# Patient Record
Sex: Male | Born: 1955
Health system: Southern US, Community
[De-identification: ages and names within clinical notes are randomized; demographics above are authoritative.]

## PROBLEM LIST (undated history)

## (undated) ENCOUNTER — Ambulatory Visit: Admission: EM | Payer: PPO

## (undated) DIAGNOSIS — Z8719 Personal history of other diseases of the digestive system: Secondary | ICD-10-CM

## (undated) DIAGNOSIS — Z7901 Long term (current) use of anticoagulants: Secondary | ICD-10-CM

## (undated) DIAGNOSIS — G8929 Other chronic pain: Secondary | ICD-10-CM

## (undated) DIAGNOSIS — Z79899 Other long term (current) drug therapy: Secondary | ICD-10-CM

## (undated) DIAGNOSIS — Z955 Presence of coronary angioplasty implant and graft: Secondary | ICD-10-CM

## (undated) DIAGNOSIS — I447 Left bundle-branch block, unspecified: Secondary | ICD-10-CM

## (undated) DIAGNOSIS — E785 Hyperlipidemia, unspecified: Secondary | ICD-10-CM

## (undated) DIAGNOSIS — E041 Nontoxic single thyroid nodule: Secondary | ICD-10-CM

## (undated) DIAGNOSIS — I10 Essential (primary) hypertension: Secondary | ICD-10-CM

## (undated) DIAGNOSIS — R519 Headache, unspecified: Secondary | ICD-10-CM

## (undated) DIAGNOSIS — Z87442 Personal history of urinary calculi: Secondary | ICD-10-CM

## (undated) DIAGNOSIS — Z9189 Other specified personal risk factors, not elsewhere classified: Secondary | ICD-10-CM

## (undated) DIAGNOSIS — I5020 Unspecified systolic (congestive) heart failure: Secondary | ICD-10-CM

## (undated) DIAGNOSIS — I700 Atherosclerosis of aorta: Secondary | ICD-10-CM

## (undated) DIAGNOSIS — D494 Neoplasm of unspecified behavior of bladder: Secondary | ICD-10-CM

## (undated) DIAGNOSIS — I7143 Infrarenal abdominal aortic aneurysm, without rupture: Secondary | ICD-10-CM

## (undated) DIAGNOSIS — K219 Gastro-esophageal reflux disease without esophagitis: Secondary | ICD-10-CM

## (undated) DIAGNOSIS — C679 Malignant neoplasm of bladder, unspecified: Secondary | ICD-10-CM

## (undated) DIAGNOSIS — Z860101 Personal history of adenomatous and serrated colon polyps: Secondary | ICD-10-CM

## (undated) DIAGNOSIS — I493 Ventricular premature depolarization: Secondary | ICD-10-CM

## (undated) DIAGNOSIS — N289 Disorder of kidney and ureter, unspecified: Secondary | ICD-10-CM

## (undated) DIAGNOSIS — I2510 Atherosclerotic heart disease of native coronary artery without angina pectoris: Secondary | ICD-10-CM

## (undated) DIAGNOSIS — Z7902 Long term (current) use of antithrombotics/antiplatelets: Secondary | ICD-10-CM

## (undated) DIAGNOSIS — R51 Headache: Secondary | ICD-10-CM

## (undated) DIAGNOSIS — F419 Anxiety disorder, unspecified: Secondary | ICD-10-CM

## (undated) DIAGNOSIS — Z7982 Long term (current) use of aspirin: Secondary | ICD-10-CM

## (undated) DIAGNOSIS — N503 Cyst of epididymis: Secondary | ICD-10-CM

## (undated) DIAGNOSIS — M1990 Unspecified osteoarthritis, unspecified site: Secondary | ICD-10-CM

## (undated) DIAGNOSIS — N400 Enlarged prostate without lower urinary tract symptoms: Secondary | ICD-10-CM

## (undated) DIAGNOSIS — J449 Chronic obstructive pulmonary disease, unspecified: Secondary | ICD-10-CM

## (undated) DIAGNOSIS — I219 Acute myocardial infarction, unspecified: Secondary | ICD-10-CM

## (undated) DIAGNOSIS — C4490 Unspecified malignant neoplasm of skin, unspecified: Secondary | ICD-10-CM

## (undated) DIAGNOSIS — E559 Vitamin D deficiency, unspecified: Secondary | ICD-10-CM

## (undated) DIAGNOSIS — G709 Myoneural disorder, unspecified: Secondary | ICD-10-CM

## (undated) DIAGNOSIS — Z86010 Personal history of colonic polyps: Secondary | ICD-10-CM

## (undated) DIAGNOSIS — I252 Old myocardial infarction: Secondary | ICD-10-CM

## (undated) HISTORY — PX: CARDIOVASCULAR STRESS TEST: SHX262

## (undated) HISTORY — PX: TRANSTHORACIC ECHOCARDIOGRAM: SHX275

---
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
## 2003-04-06 ENCOUNTER — Other Ambulatory Visit: Payer: Self-pay

## 2003-04-06 DIAGNOSIS — I2119 ST elevation (STEMI) myocardial infarction involving other coronary artery of inferior wall: Secondary | ICD-10-CM

## 2003-04-06 HISTORY — DX: ST elevation (STEMI) myocardial infarction involving other coronary artery of inferior wall: I21.19

## 2003-04-07 ENCOUNTER — Other Ambulatory Visit: Payer: Self-pay

## 2003-04-10 ENCOUNTER — Other Ambulatory Visit: Payer: Self-pay

## 2003-04-10 DIAGNOSIS — I2510 Atherosclerotic heart disease of native coronary artery without angina pectoris: Secondary | ICD-10-CM

## 2003-04-10 HISTORY — PX: CORONARY ANGIOPLASTY WITH STENT PLACEMENT: SHX49

## 2003-04-10 HISTORY — DX: Atherosclerotic heart disease of native coronary artery without angina pectoris: I25.10

## 2003-08-19 ENCOUNTER — Other Ambulatory Visit: Payer: Self-pay

## 2003-08-21 ENCOUNTER — Other Ambulatory Visit: Payer: Self-pay

## 2004-03-11 ENCOUNTER — Ambulatory Visit: Payer: Self-pay | Admitting: Specialist

## 2004-04-07 HISTORY — PX: PARTIAL COLECTOMY: SHX5273

## 2004-04-16 ENCOUNTER — Ambulatory Visit: Payer: Self-pay | Admitting: Gastroenterology

## 2004-05-16 ENCOUNTER — Ambulatory Visit: Payer: Self-pay | Admitting: Internal Medicine

## 2004-05-24 ENCOUNTER — Other Ambulatory Visit: Payer: Self-pay

## 2004-05-29 ENCOUNTER — Inpatient Hospital Stay: Payer: Self-pay | Admitting: General Surgery

## 2004-05-31 ENCOUNTER — Other Ambulatory Visit: Payer: Self-pay

## 2004-06-01 ENCOUNTER — Other Ambulatory Visit: Payer: Self-pay

## 2004-06-05 ENCOUNTER — Ambulatory Visit: Payer: Self-pay | Admitting: Internal Medicine

## 2005-05-06 ENCOUNTER — Encounter: Payer: Self-pay | Admitting: Internal Medicine

## 2005-05-08 ENCOUNTER — Encounter: Payer: Self-pay | Admitting: Internal Medicine

## 2005-06-05 ENCOUNTER — Encounter: Payer: Self-pay | Admitting: Internal Medicine

## 2005-07-15 ENCOUNTER — Ambulatory Visit: Payer: Self-pay | Admitting: Physician Assistant

## 2005-08-25 ENCOUNTER — Ambulatory Visit: Payer: Self-pay | Admitting: Unknown Physician Specialty

## 2006-02-12 ENCOUNTER — Ambulatory Visit: Payer: Self-pay | Admitting: Vascular Surgery

## 2006-03-05 ENCOUNTER — Ambulatory Visit: Payer: Self-pay | Admitting: Gastroenterology

## 2006-07-16 ENCOUNTER — Ambulatory Visit: Payer: Self-pay | Admitting: Internal Medicine

## 2008-08-14 ENCOUNTER — Ambulatory Visit: Payer: Self-pay | Admitting: Gastroenterology

## 2010-04-15 ENCOUNTER — Ambulatory Visit: Payer: Self-pay | Admitting: Internal Medicine

## 2010-10-28 ENCOUNTER — Ambulatory Visit: Payer: Self-pay | Admitting: Gastroenterology

## 2010-10-29 LAB — PATHOLOGY REPORT

## 2011-01-30 ENCOUNTER — Ambulatory Visit: Payer: Self-pay

## 2012-04-07 HISTORY — PX: TRANSURETHRAL RESECTION OF BLADDER TUMOR: SHX2575

## 2012-07-12 ENCOUNTER — Ambulatory Visit: Payer: Self-pay | Admitting: Urology

## 2012-07-12 DIAGNOSIS — I2510 Atherosclerotic heart disease of native coronary artery without angina pectoris: Secondary | ICD-10-CM

## 2012-07-12 LAB — CBC WITH DIFFERENTIAL/PLATELET
Basophil #: 0 10*3/uL (ref 0.0–0.1)
Basophil %: 0.4 %
Eosinophil #: 0.4 10*3/uL (ref 0.0–0.7)
Eosinophil %: 3.9 %
HGB: 16.9 g/dL (ref 13.0–18.0)
Lymphocyte #: 2.3 10*3/uL (ref 1.0–3.6)
Lymphocyte %: 24.9 %
MCH: 30.5 pg (ref 26.0–34.0)
MCV: 90 fL (ref 80–100)
Monocyte #: 0.5 x10 3/mm (ref 0.2–1.0)
Monocyte %: 5.8 %
Neutrophil %: 65 %
Platelet: 193 10*3/uL (ref 150–440)
RBC: 5.56 10*6/uL (ref 4.40–5.90)
RDW: 12.8 % (ref 11.5–14.5)
WBC: 9.3 10*3/uL (ref 3.8–10.6)

## 2012-07-12 LAB — BASIC METABOLIC PANEL
Anion Gap: 2 — ABNORMAL LOW (ref 7–16)
BUN: 13 mg/dL (ref 7–18)
Calcium, Total: 9.1 mg/dL (ref 8.5–10.1)
Co2: 30 mmol/L (ref 21–32)
Creatinine: 0.85 mg/dL (ref 0.60–1.30)
Glucose: 88 mg/dL (ref 65–99)
Potassium: 4.6 mmol/L (ref 3.5–5.1)
Sodium: 139 mmol/L (ref 136–145)

## 2012-09-07 ENCOUNTER — Ambulatory Visit: Payer: Self-pay | Admitting: Urology

## 2012-09-13 ENCOUNTER — Ambulatory Visit: Payer: Self-pay | Admitting: Urology

## 2012-09-14 DIAGNOSIS — C679 Malignant neoplasm of bladder, unspecified: Secondary | ICD-10-CM

## 2012-09-14 HISTORY — DX: Malignant neoplasm of bladder, unspecified: C67.9

## 2012-09-16 LAB — PATHOLOGY REPORT

## 2013-03-07 ENCOUNTER — Observation Stay: Payer: Self-pay | Admitting: Internal Medicine

## 2013-03-07 LAB — CK TOTAL AND CKMB (NOT AT ARMC)
CK, Total: 95 U/L (ref 35–232)
CK-MB: 1.1 ng/mL (ref 0.5–3.6)
CK-MB: 1.2 ng/mL (ref 0.5–3.6)

## 2013-03-07 LAB — URINALYSIS, COMPLETE
Bacteria: NONE SEEN
Blood: NEGATIVE
Nitrite: NEGATIVE
Ph: 5 (ref 4.5–8.0)
Protein: NEGATIVE
RBC,UR: 1 /HPF (ref 0–5)
WBC UR: 1 /HPF (ref 0–5)

## 2013-03-07 LAB — CBC
HCT: 47.7 % (ref 40.0–52.0)
Platelet: 184 10*3/uL (ref 150–440)
RBC: 5.39 10*6/uL (ref 4.40–5.90)
RDW: 13.3 % (ref 11.5–14.5)

## 2013-03-07 LAB — BASIC METABOLIC PANEL
Anion Gap: 3 — ABNORMAL LOW (ref 7–16)
BUN: 17 mg/dL (ref 7–18)
Chloride: 108 mmol/L — ABNORMAL HIGH (ref 98–107)
Creatinine: 0.93 mg/dL (ref 0.60–1.30)
EGFR (African American): >60
EGFR (Non-African Amer.): >60
Glucose: 92 mg/dL (ref 65–99)
Osmolality: 281 (ref 275–301)

## 2013-03-07 LAB — APTT: Activated PTT: 28.6 secs (ref 23.6–35.9)

## 2013-03-07 LAB — TROPONIN I
Troponin-I: <0.02 ng/mL
Troponin-I: <0.02 ng/mL

## 2013-03-08 LAB — APTT: Activated PTT: 92 secs — ABNORMAL HIGH (ref 23.6–35.9)

## 2013-03-08 LAB — CK TOTAL AND CKMB (NOT AT ARMC)
CK, Total: 83 U/L (ref 35–232)
CK-MB: 0.9 ng/mL (ref 0.5–3.6)

## 2013-03-08 LAB — TROPONIN I: Troponin-I: <0.02 ng/mL

## 2013-09-15 ENCOUNTER — Other Ambulatory Visit: Payer: Self-pay | Admitting: Urology

## 2013-09-16 ENCOUNTER — Encounter (HOSPITAL_BASED_OUTPATIENT_CLINIC_OR_DEPARTMENT_OTHER): Payer: Self-pay | Admitting: *Deleted

## 2013-09-16 NOTE — Progress Notes (Signed)
09/16/13 1552  OBSTRUCTIVE SLEEP APNEA  Have you ever been diagnosed with sleep apnea through a sleep study? No  Do you snore loudly (loud enough to be heard through closed doors)?  1  Do you often feel tired, fatigued, or sleepy during the daytime? 0  Has anyone observed you stop breathing during your sleep? 0  Do you have, or are you being treated for high blood pressure? 1  BMI more than 35 kg/m2? 0  Age over 58 years old? 1  Neck circumference greater than 40 cm/16 inches? 1  Gender: 1  Obstructive Sleep Apnea Score 5  Score 4 or greater  Results sent to PCP

## 2013-09-16 NOTE — Progress Notes (Addendum)
NPO AFTER MN WITH EXCEPTION CLEAR LIQUIDS UNTIL 0730 (NO CREAM/ MILK PRODUCTS).  ARRIVE AT 1200. NEEDS ISTAT. CURRENT EKG, LOV NOTE, STRESS TEST TO BE FAXED FROM DR Nehemiah Massed  #[phone] [facility] ).  WILL TAKE PROTONIX AM DOS W/ SIPS OF WATER.  Received fax--  Pt needs ekg on arrival.

## 2013-09-26 ENCOUNTER — Ambulatory Visit (HOSPITAL_COMMUNITY): Payer: BC Managed Care – PPO

## 2013-09-26 ENCOUNTER — Encounter (HOSPITAL_BASED_OUTPATIENT_CLINIC_OR_DEPARTMENT_OTHER)
Admission: RE | Disposition: A | Discharge status: Home / Self Care | Payer: Self-pay | Source: Ambulatory Visit | Attending: Urology

## 2013-09-26 ENCOUNTER — Ambulatory Visit (HOSPITAL_BASED_OUTPATIENT_CLINIC_OR_DEPARTMENT_OTHER)
Admission: RE | Admit: 2013-09-26 | Discharge: 2013-09-26 | Disposition: A | Discharge status: Home / Self Care | Payer: BC Managed Care – PPO | Source: Ambulatory Visit | Attending: Urology | Admitting: Urology

## 2013-09-26 ENCOUNTER — Encounter (HOSPITAL_BASED_OUTPATIENT_CLINIC_OR_DEPARTMENT_OTHER): Payer: BC Managed Care – PPO | Admitting: Anesthesiology

## 2013-09-26 ENCOUNTER — Encounter (HOSPITAL_BASED_OUTPATIENT_CLINIC_OR_DEPARTMENT_OTHER): Payer: Self-pay | Admitting: *Deleted

## 2013-09-26 ENCOUNTER — Ambulatory Visit (HOSPITAL_BASED_OUTPATIENT_CLINIC_OR_DEPARTMENT_OTHER): Payer: BC Managed Care – PPO | Admitting: Anesthesiology

## 2013-09-26 DIAGNOSIS — I2510 Atherosclerotic heart disease of native coronary artery without angina pectoris: Secondary | ICD-10-CM | POA: Insufficient documentation

## 2013-09-26 DIAGNOSIS — I252 Old myocardial infarction: Secondary | ICD-10-CM | POA: Insufficient documentation

## 2013-09-26 DIAGNOSIS — Z87891 Personal history of nicotine dependence: Secondary | ICD-10-CM | POA: Insufficient documentation

## 2013-09-26 DIAGNOSIS — K219 Gastro-esophageal reflux disease without esophagitis: Secondary | ICD-10-CM | POA: Insufficient documentation

## 2013-09-26 DIAGNOSIS — K449 Diaphragmatic hernia without obstruction or gangrene: Secondary | ICD-10-CM | POA: Insufficient documentation

## 2013-09-26 DIAGNOSIS — C679 Malignant neoplasm of bladder, unspecified: Secondary | ICD-10-CM | POA: Insufficient documentation

## 2013-09-26 DIAGNOSIS — Z9861 Coronary angioplasty status: Secondary | ICD-10-CM | POA: Insufficient documentation

## 2013-09-26 DIAGNOSIS — C674 Malignant neoplasm of posterior wall of bladder: Secondary | ICD-10-CM

## 2013-09-26 DIAGNOSIS — Z8551 Personal history of malignant neoplasm of bladder: Secondary | ICD-10-CM | POA: Insufficient documentation

## 2013-09-26 HISTORY — DX: Personal history of adenomatous and serrated colon polyps: Z86.0101

## 2013-09-26 HISTORY — PX: CYSTOSCOPY W/ RETROGRADES: SHX1426

## 2013-09-26 HISTORY — PX: TRANSURETHRAL RESECTION OF BLADDER TUMOR WITH GYRUS (TURBT-GYRUS): SHX6458

## 2013-09-26 HISTORY — DX: Presence of coronary angioplasty implant and graft: Z95.5

## 2013-09-26 HISTORY — DX: Other specified personal risk factors, not elsewhere classified: Z91.89

## 2013-09-26 HISTORY — DX: Gastro-esophageal reflux disease without esophagitis: K21.9

## 2013-09-26 HISTORY — DX: Atherosclerotic heart disease of native coronary artery without angina pectoris: I25.10

## 2013-09-26 HISTORY — DX: Old myocardial infarction: I25.2

## 2013-09-26 HISTORY — DX: Personal history of colonic polyps: Z86.010

## 2013-09-26 HISTORY — DX: Hyperlipidemia, unspecified: E78.5

## 2013-09-26 HISTORY — DX: Malignant neoplasm of bladder, unspecified: C67.9

## 2013-09-26 HISTORY — DX: Personal history of other diseases of the digestive system: Z87.19

## 2013-09-26 LAB — POCT I-STAT 4, (NA,K, GLUC, HGB,HCT)
Glucose, Bld: 88 mg/dL (ref 70–99)
HEMATOCRIT: 50 % (ref 39.0–52.0)
Hemoglobin: 17 g/dL (ref 13.0–17.0)
Potassium: 4.2 mEq/L (ref 3.7–5.3)
Sodium: 142 mEq/L (ref 137–147)

## 2013-09-26 SURGERY — TRANSURETHRAL RESECTION OF BLADDER TUMOR WITH GYRUS (TURBT-GYRUS)
Anesthesia: General | Site: Ureter

## 2013-09-26 MED ORDER — BELLADONNA ALKALOIDS-OPIUM 16.2-60 MG RE SUPP
RECTAL | Status: AC
  Filled 2013-09-26: qty 1

## 2013-09-26 MED ORDER — LACTATED RINGERS IV SOLN
INTRAVENOUS | Status: DC
  Administered 2013-09-26 (×2): via INTRAVENOUS
  Filled 2013-09-26: qty 1000

## 2013-09-26 MED ORDER — PROPOFOL 10 MG/ML IV BOLUS
INTRAVENOUS | Status: DC | PRN
  Administered 2013-09-26: 200 mg via INTRAVENOUS

## 2013-09-26 MED ORDER — PHENAZOPYRIDINE HCL 200 MG PO TABS
200.0000 mg | ORAL_TABLET | Freq: Once | ORAL | Status: AC
  Administered 2013-09-26: 200 mg via ORAL
  Filled 2013-09-26: qty 1

## 2013-09-26 MED ORDER — DEXAMETHASONE SODIUM PHOSPHATE 4 MG/ML IJ SOLN
INTRAMUSCULAR | Status: DC | PRN
  Administered 2013-09-26: 10 mg via INTRAVENOUS

## 2013-09-26 MED ORDER — SENNOSIDES-DOCUSATE SODIUM 8.6-50 MG PO TABS: 1.0000 | ORAL_TABLET | Freq: Two times a day (BID) | ORAL | Status: DC

## 2013-09-26 MED ORDER — LACTATED RINGERS IV SOLN
INTRAVENOUS | Status: DC
  Filled 2013-09-26: qty 1000

## 2013-09-26 MED ORDER — MIDAZOLAM HCL 5 MG/5ML IJ SOLN
INTRAMUSCULAR | Status: DC | PRN
  Administered 2013-09-26: 2 mg via INTRAVENOUS

## 2013-09-26 MED ORDER — FENTANYL CITRATE 0.05 MG/ML IJ SOLN
INTRAMUSCULAR | Status: AC
  Filled 2013-09-26: qty 2

## 2013-09-26 MED ORDER — FENTANYL CITRATE 0.05 MG/ML IJ SOLN
INTRAMUSCULAR | Status: DC | PRN
  Administered 2013-09-26: 50 ug via INTRAVENOUS
  Administered 2013-09-26 (×3): 25 ug via INTRAVENOUS
  Administered 2013-09-26: 50 ug via INTRAVENOUS
  Administered 2013-09-26: 25 ug via INTRAVENOUS
  Administered 2013-09-26: 50 ug via INTRAVENOUS

## 2013-09-26 MED ORDER — MIDAZOLAM HCL 2 MG/2ML IJ SOLN
INTRAMUSCULAR | Status: AC
  Filled 2013-09-26: qty 2

## 2013-09-26 MED ORDER — HYDROCODONE-ACETAMINOPHEN 5-325 MG PO TABS: 1.0000 | ORAL_TABLET | Freq: Four times a day (QID) | ORAL | Status: DC | PRN

## 2013-09-26 MED ORDER — LIDOCAINE HCL (CARDIAC) 20 MG/ML IV SOLN
INTRAVENOUS | Status: DC | PRN
  Administered 2013-09-26: 80 mg via INTRAVENOUS

## 2013-09-26 MED ORDER — PHENAZOPYRIDINE HCL 200 MG PO TABS: 200.0000 mg | ORAL_TABLET | Freq: Three times a day (TID) | ORAL | Status: DC | PRN

## 2013-09-26 MED ORDER — CEPHALEXIN 500 MG PO CAPS: 500.0000 mg | ORAL_CAPSULE | Freq: Two times a day (BID) | ORAL | Status: DC

## 2013-09-26 MED ORDER — OXYBUTYNIN CHLORIDE 5 MG PO TABS
ORAL_TABLET | ORAL | Status: AC
  Filled 2013-09-26: qty 1

## 2013-09-26 MED ORDER — HYDROMORPHONE HCL PF 1 MG/ML IJ SOLN
0.2500 mg | INTRAMUSCULAR | Status: DC | PRN
  Administered 2013-09-26: 0.25 mg via INTRAVENOUS
  Filled 2013-09-26: qty 1

## 2013-09-26 MED ORDER — HYDROMORPHONE HCL PF 1 MG/ML IJ SOLN
INTRAMUSCULAR | Status: AC
  Filled 2013-09-26: qty 1

## 2013-09-26 MED ORDER — PROMETHAZINE HCL 25 MG/ML IJ SOLN
6.2500 mg | INTRAMUSCULAR | Status: DC | PRN
  Filled 2013-09-26: qty 1

## 2013-09-26 MED ORDER — PHENAZOPYRIDINE HCL 100 MG PO TABS
ORAL_TABLET | ORAL | Status: AC
  Filled 2013-09-26: qty 2

## 2013-09-26 MED ORDER — OXYBUTYNIN CHLORIDE 5 MG PO TABS
5.0000 mg | ORAL_TABLET | Freq: Once | ORAL | Status: AC
  Administered 2013-09-26: 5 mg via ORAL
  Filled 2013-09-26: qty 1

## 2013-09-26 MED ORDER — STERILE WATER FOR IRRIGATION IR SOLN
Status: DC | PRN
  Administered 2013-09-26: 6000 mL

## 2013-09-26 MED ORDER — OXYBUTYNIN CHLORIDE 5 MG PO TABS: 5.0000 mg | ORAL_TABLET | Freq: Four times a day (QID) | ORAL | Status: DC | PRN

## 2013-09-26 MED ORDER — CEFAZOLIN SODIUM-DEXTROSE 2-3 GM-% IV SOLR
2.0000 g | INTRAVENOUS | Status: AC
  Administered 2013-09-26: 2 g via INTRAVENOUS
  Filled 2013-09-26: qty 50

## 2013-09-26 MED ORDER — FENTANYL CITRATE 0.05 MG/ML IJ SOLN
INTRAMUSCULAR | Status: AC
  Filled 2013-09-26: qty 4

## 2013-09-26 MED ORDER — ONDANSETRON HCL 4 MG/2ML IJ SOLN
INTRAMUSCULAR | Status: DC | PRN
  Administered 2013-09-26: 4 mg via INTRAVENOUS

## 2013-09-26 MED ORDER — BELLADONNA ALKALOIDS-OPIUM 16.2-60 MG RE SUPP
RECTAL | Status: DC | PRN
  Administered 2013-09-26: 1 via RECTAL

## 2013-09-26 SURGICAL SUPPLY — 51 items
BAG DRAIN URO-CYSTO SKYTR STRL (DRAIN) ×4 IMPLANT
BAG URINE DRAINAGE (UROLOGICAL SUPPLIES) ×4 IMPLANT
BAG URINE LEG 19OZ MD ST LTX (BAG) IMPLANT
BASKET LASER NITINOL 1.9FR (BASKET) IMPLANT
BASKET STNLS GEMINI 4WIRE 3FR (BASKET) IMPLANT
BASKET ZERO TIP NITINOL 2.4FR (BASKET) IMPLANT
CANISTER SUCT LVC 12 LTR MEDI- (MISCELLANEOUS) ×12 IMPLANT
CATH COUDE FOLEY 2W 5CC 20FR (CATHETERS) ×4 IMPLANT
CATH FOLEY 3WAY 30CC 24FR (CATHETERS) ×2
CATH HEMA 3WAY 30CC 24FR COUDE (CATHETERS) IMPLANT
CATH HEMA 3WAY 30CC 24FR RND (CATHETERS) IMPLANT
CATH INTERMIT  6FR 70CM (CATHETERS) IMPLANT
CATH URET 5FR 28IN CONE TIP (BALLOONS)
CATH URET 5FR 28IN OPEN ENDED (CATHETERS) ×4 IMPLANT
CATH URET 5FR 70CM CONE TIP (BALLOONS) IMPLANT
CATH URTH STD 24FR FL 3W 2 (CATHETERS) ×2 IMPLANT
CLOTH BEACON ORANGE TIMEOUT ST (SAFETY) ×4 IMPLANT
DRAPE CAMERA CLOSED 9X96 (DRAPES) ×4 IMPLANT
ELECT BUTTON HF 24-28F 2 30DE (ELECTRODE) IMPLANT
ELECT LOOP MED HF 24F 12D CBL (CLIP) ×4 IMPLANT
ELECT REM PT RETURN 9FT ADLT (ELECTROSURGICAL) ×4
ELECT RESECT VAPORIZE 12D CBL (ELECTRODE) IMPLANT
ELECTRODE REM PT RTRN 9FT ADLT (ELECTROSURGICAL) ×2 IMPLANT
EVACUATOR MICROVAS BLADDER (UROLOGICAL SUPPLIES) ×4 IMPLANT
FIBER LASER FLEXIVA 200 (UROLOGICAL SUPPLIES) IMPLANT
FIBER LASER FLEXIVA 365 (UROLOGICAL SUPPLIES) IMPLANT
GLOVE BIO SURGEON STRL SZ 6.5 (GLOVE) ×3 IMPLANT
GLOVE BIO SURGEON STRL SZ7 (GLOVE) ×4 IMPLANT
GLOVE BIO SURGEONS STRL SZ 6.5 (GLOVE) ×1
GLOVE INDICATOR 7.0 STRL GRN (GLOVE) ×8 IMPLANT
GLOVE INDICATOR 7.5 STRL GRN (GLOVE) IMPLANT
GOWN PREVENTION PLUS LG XLONG (DISPOSABLE) IMPLANT
GOWN STRL REUS W/ TWL LRG LVL3 (GOWN DISPOSABLE) ×2 IMPLANT
GOWN STRL REUS W/TWL LRG LVL3 (GOWN DISPOSABLE) ×2
GOWN STRL REUS W/TWL XL LVL3 (GOWN DISPOSABLE) ×4 IMPLANT
GUIDEWIRE 0.038 PTFE COATED (WIRE) IMPLANT
GUIDEWIRE ANG ZIPWIRE 038X150 (WIRE) IMPLANT
GUIDEWIRE STR DUAL SENSOR (WIRE) ×8 IMPLANT
HOLDER FOLEY CATH W/STRAP (MISCELLANEOUS) ×4 IMPLANT
IV NS IRRIG 3000ML ARTHROMATIC (IV SOLUTION) IMPLANT
KIT BALLIN UROMAX 15FX10 (LABEL) IMPLANT
KIT BALLN UROMAX 15FX4 (MISCELLANEOUS) IMPLANT
KIT BALLN UROMAX 26 75X4 (MISCELLANEOUS)
PACK CYSTOSCOPY (CUSTOM PROCEDURE TRAY) ×4 IMPLANT
PLUG CATH AND CAP STER (CATHETERS) IMPLANT
SET ASPIRATION TUBING (TUBING) ×4 IMPLANT
SET HIGH PRES BAL DIL (LABEL)
SHEATH URET ACCESS 12FR/35CM (UROLOGICAL SUPPLIES) IMPLANT
SHEATH URET ACCESS 12FR/55CM (UROLOGICAL SUPPLIES) IMPLANT
SYR 30ML LL (SYRINGE) IMPLANT
SYRINGE IRR TOOMEY STRL 70CC (SYRINGE) ×4 IMPLANT

## 2013-09-26 NOTE — Anesthesia Procedure Notes (Signed)
Procedure Name: LMA Insertion Date/Time: 09/26/2013 1:53 PM Performed by: Denna Haggard D Pre-anesthesia Checklist: Patient identified, Emergency Drugs available, Suction available and Patient being monitored Patient Re-evaluated:Patient Re-evaluated prior to inductionOxygen Delivery Method: Circle System Utilized Preoxygenation: Pre-oxygenation with 100% oxygen Intubation Type: IV induction Ventilation: Mask ventilation without difficulty LMA: LMA inserted LMA Size: 5.0 Number of attempts: 1 Airway Equipment and Method: bite block Placement Confirmation: positive ETCO2 Tube secured with: Tape Dental Injury: Teeth and Oropharynx as per pre-operative assessment

## 2013-09-26 NOTE — Op Note (Signed)
Urology Operative Report  Date of Procedure: 09/26/13  Surgeon: Rolan Bucco, MD Assistant:  None  Preoperative Diagnosis: Bladder cancer Postoperative Diagnosis:  Same  Procedure(s): Cystoscopy with bladder biopsy. Biopsy of prostatic urethra. On table cystogram (gravity fill) with interpretation. Foley catheter placement.   Estimated blood loss: Minimal  Specimen: Bladder biopsy. Prostatic urethral biopsy.  Drains: Foley cath (20 Fr coude).  Complications: None  Findings: Negative extravasation of contrast on cystogram. Frondular mucosa of the prosthetic urethra. Small sessile lesion in the posterior bladder wall.  History of present illness: 58 year old male with a history of bladder cancer presents today for bladder biopsy and bilateral retrograde pyelograms do to a new bladder lesion seen on office cystoscopy.   Procedure in detail: After informed consent was obtained, the patient was taken to the operating room. They were placed in the supine position. SCDs were turned on and in place. IV antibiotics were infused, and general anesthesia was induced. A timeout was performed in which the correct patient, surgical site, and procedure were identified and agreed upon by the team.  The patient was placed in a dorsolithotomy position, making sure to pad all pertinent neurovascular pressure points. A belladonna and opium suppository was placed into the rectum. The genitals were prepped and draped in the usual sterile fashion.  A rigid cystoscope was this the urethra and into the bladder. In the prosthetic urethra there is noted to be some frondular mucosa but no bladder or urethral tumors.  Once entering the bladder the bladder was drained and then fully distended. It was noted to be very trabeculated similar to an obstructed bladder. There was bilobar hyperplasia of the prostate. The ureter orifices were identified and seen to be effluxing clear yellow urine, but they were  somewhat ectopic and lateral. I attempted to place a sensor wire in an angle-tipped Glidewire into these in order placed a ureter catheter for retrograde PolyGram's, but this was unsuccessful. Rather the wrist damage to the ureter orifice, I did not fill it was appropriate to try to force the ureter catheter into place. There was no tumor close to the ureter orifices bilaterally.  Initially, visualization was good and I was able to identify the tumor on the posterior bladder wall which was sessile appearing and very well circumscribed. It was flat and smooth. I evaluated the bladder with a 30 and 70 lens in a systematic fashion to visualize the entire surface of the bladder. There were no other tumors.  I was able use cold cup forcep to biopsy the posterior bladder wall tumor. I did not take a very deep bite as this was a very sessile lesion. Immediately a large glob of fat emanated into the bladder. I then cauterized the area of the biopsy with the gyrus resectoscope in normal saline. This was a very in usual appearance to see fat protruding into the bladder after just removing the mucosa. It is difficult to tell whether this could be a composite fat which was submucosal versus fat emanating from outside the bladder. Because of this I felt it was the most appropriate to place a catheter obtain a cystogram.  I performed a prostatic urethral biopsy by using the cold cup biopsy forcep to biopsy the right lateral lobe of the prostate proximal to the external sphincter. This was cauterized with the Bugbee electrode with good hemostasis.  As time went on, visualization decreased do to bleeding from the prostate.  I placed a 34 [place] coud-tip only catheter with ease with  10 cc of sterile water in the balloon. I drained her bladder completely  I performed gravity fill cystogram by first obtaining a film with just a Foley catheter in place and no contrast. I then gravity filled the bladder with approximately  300 cc of Cystografin. The bladder would not hold any further fluid. A film this showed a well circumscribed bladder without any extravasation. I then drained the bladder and your gated with some sterile normal saline. A third film of the drainage phase shows mild residual contrast which could represent extraperitoneal perforation versus contrast retained within the trabeculations which is more likely.  I felt this was most appropriate to leave the Foley catheter in place to be cautious side. This completed the procedure. He's placed back in supine position, anesthesia was reversed, and he was taken to the PACU in stable condition.  All counts were correct at the end of the case.  He was instructed that he may restart his Plavix in 4 days.

## 2013-09-26 NOTE — Anesthesia Preprocedure Evaluation (Signed)
Anesthesia Evaluation  Patient identified by MRN, date of birth, ID band Patient awake    Reviewed: Allergy & Precautions, H&P , NPO status , Patient's Chart, lab work & pertinent test results  Airway Mallampati: III TM Distance: >3 FB Neck ROM: Full    Dental  (+) Teeth Intact, Dental Advisory Given   Pulmonary neg pulmonary ROS, former smoker,  breath sounds clear to auscultation  Pulmonary exam normal       Cardiovascular + CAD, + Past MI and + Cardiac Stents negative cardio ROS  Rhythm:Regular Rate:Normal     Neuro/Psych negative neurological ROS  negative psych ROS   GI/Hepatic negative GI ROS, Neg liver ROS, hiatal hernia, GERD-  ,  Endo/Other  negative endocrine ROS  Renal/GU negative Renal ROS   Neoplasm bladder.    Musculoskeletal negative musculoskeletal ROS (+)   Abdominal   Peds  Hematology negative hematology ROS (+)   Anesthesia Other Findings   Reproductive/Obstetrics                           Anesthesia Physical Anesthesia Plan  ASA: III  Anesthesia Plan: General   Post-op Pain Management:    Induction: Intravenous  Airway Management Planned: LMA  Additional Equipment:   Intra-op Plan:   Post-operative Plan: Extubation in OR  Informed Consent: I have reviewed the patients History and Physical, chart, labs and discussed the procedure including the risks, benefits and alternatives for the proposed anesthesia with the patient or authorized representative who has indicated his/her understanding and acceptance.   Dental advisory given  Plan Discussed with: CRNA  Anesthesia Plan Comments:         Anesthesia Quick Evaluation

## 2013-09-26 NOTE — Transfer of Care (Signed)
Immediate Anesthesia Transfer of Care Note  Patient: Jeffrey Buchanan  Procedure(s) Performed: Procedure(s) (LRB): BLADDER BIOPSY (N/A) CYSTOSCOPY WITH BILATERAL RETROGRADE PYELOGRAM (Bilateral)  Patient Location: PACU  Anesthesia Type: General  Level of Consciousness: awake, oriented, sedated and patient cooperative  Airway & Oxygen Therapy: Patient Spontanous Breathing and Patient connected to face mask oxygen  Post-op Assessment: Report given to PACU RN and Post -op Vital signs reviewed and stable  Post vital signs: Reviewed and stable  Complications: No apparent anesthesia complications

## 2013-09-26 NOTE — H&P (Signed)
Urology History and Physical Exam  CC: Bladder cancer  HPI:  58 year old male presents today for bladder cancer.  He was initially diagnosed in 2014.  Resection and [place] in June 2014 revealed urothelial carcinoma which is low-grade and noninvasive.  He had a repeat cystoscopy in February 2015 which revealed a small caliber urethra and recurrent tumor.  He presented to my officeearlier this month for second opinion.  Cystoscopy in clinic revealed a tumor in the posterior midline bladder wall.  This appeared to be sessile in nature.it was approximately 1 cm in size.  Not associated with gross hematuria. I have recommended cystoscopy, transurethral resection of bladder tumor, and bilateral retrograde pyelograms.  We have discussed risks, benefits, alternatives, and likelihood of achieving goals. Patient cleared by Roderic Palau ANP-C (Dr. Derrick Ravel office) to hold ASA & plavix 5 days prior to surgery. Urine from 09/13/13 was negative for signs of infection.  PMH: Past Medical History  Diagnosis Date  . Bladder cancer   . Coronary artery disease CARDIOLOGIST-- DR Edyth Gunnels [facility])    MI  IN 2005--  S/P  PTCA  X2 STENTS  . S/P coronary artery stent placement     2005  . Hyperlipidemia   . History of myocardial infarction     2005--  S/P  STENTING X2  . GERD (gastroesophageal reflux disease)   . H/O hiatal hernia   . History of adenomatous polyp of colon     S/P  PARTIAL COLECTOMY 2006  . At risk for sleep apnea     STOP-BANG= 5     SENT TO PCP 09-16-2013    PSH: Past Surgical History  Procedure Laterality Date  . Partial colectomy  2006  . Open reduction shoulder dislocation  1976  . Transurethral resection of bladder tumor  2014    [facility]  . Coronary angioplasty with stent placement  01/ 2005   [facility]    STENT to RCA  . Transthoracic echocardiogram  12-17-2011  dr Nehemiah Massed    mild lv dysfunction/  ef 45%/  moderate lvh/  mild mitral & tricuspid insufficiencey  .  Cardiovascular stress test  2014  dr Nehemiah Massed    normal / no ischemia    Allergies: No Known Allergies  Medications: No prescriptions prior to admission     Social History: History   Social History  . Marital Status: Married    Spouse Name: N/A    Number of Children: N/A  . Years of Education: N/A   Occupational History  . Not on file.   Social History Main Topics  . Smoking status: Former Smoker -- 1.00 packs/day for 32 years    Types: Cigarettes    Quit date: 09/17/2003  . Smokeless tobacco: Never Used  . Alcohol Use: No  . Drug Use: No  . Sexual Activity: Not on file   Other Topics Concern  . Not on file   Social History Narrative  . No narrative on file    Family History: History reviewed. No pertinent family history.  Review of Systems: Positive: None. Negative: Fever, SOB, or chest pain.  A further 10 point review of systems was negative except what is listed in the HPI.  Physical Exam: Filed Vitals:   09/26/13 1207  BP: 142/88  Pulse: 94  Temp: 97.4 F (36.3 C)  Resp: 16    General: No acute distress.  Awake. Head:  Normocephalic.  Atraumatic. ENT:  EOMI.  Mucous membranes moist Neck:  Supple.  No lymphadenopathy.  CV:  S1 present. S2 present. Regular rate. Pulmonary: Equal effort bilaterally.  Clear to auscultation bilaterally. Abdomen: Soft.  Non- tender to palpation. Skin:  Normal turgor.  No visible rash. Extremity: No gross deformity of bilateral upper extremities.  No gross deformity of    bilateral lower extremities. Neurologic: Alert. Appropriate mood.    Studies:  No results found for this basename: HGB, WBC, PLT,  in the last 72 hours  No results found for this basename: NA, K, CL, CO2, BUN, CREATININE, CALCIUM, MAGNESIUM, GFRNONAA, GFRAA,  in the last 72 hours   No results found for this basename: PT, INR, APTT,  in the last 72 hours   No components found with this basename: ABG,     Assessment:  Bladder  cancer.  Plan: To OR for cystoscopy, transurethral resection of bladder tumor, and bilateral retrograde pyelograms.

## 2013-09-26 NOTE — Discharge Instructions (Addendum)
DISCHARGE INSTRUCTIONS FOR TRANSURETHRAL SURGERY OF BLADDER  MEDICATIONS:   1. Resume your plavix and aspirin in 4 days.  2. Resume all your other meds from home.  3. If you were taking finasteride and/or tamsulosin, you should continue these medications.  ACTIVITY 1. No heavy lifting >10 pounds for 2 weeks 2. No sexual activity for 2 weeks 3. No strenuous activity for 2 weeks 4. No driving while on narcotic pain medications 5. Drink plenty of water 6. Continue to walk at home - you can still get blood clots when you are at  home, so keep active, but don't over do it. 7. Your urine may have some blood in it - make sure you drink plenty of water,  call or come to the ER immediately if your catheter stops draining  FOLEY CATHETER  (If you go home with a catheter in place.) 1. Make sure your catheter is attached to your leg at all times - do not let  anything pull on it 2. If the urine is your tube starts looking dark red or if it stops draining,  call us immediately or come to the ER 3. Drink plenty of water, if you do notice your urine looking darking, sit down,  relax and drink lots of water 4. You will be given a leg bag as well as an overnight bag for your catheter -  MAKE SURE ATTACHED TO YOUR LEG AT ALL TIMES WITH TAPE OR LEG STRAP  BATHING 1. You can shower.  You may take a bath unless you have a Foley catheter in place.  SIGNS/SYMPTOMS TO CALL: 1. Please call us if you have a fever greater than 101.5, uncontrolled  nausea/vomiting, uncontrolled pain, dizziness, unable to urinate, chest pain, shortness of breath, leg swelling, leg pain, or any other concerns  or questions.  You can reach [place] at [phone].   Post Anesthesia Home Care Instructions  Activity: Get plenty of rest for the remainder of the day. A responsible adult should stay with you for 24 hours following the procedure.  For the next 24 hours, DO NOT: -Drive a car -Paediatric nurse -Drink alcoholic  beverages -Take any medication unless instructed by your physician -Make any legal decisions or sign important papers.  Meals: Start with liquid foods such as gelatin or soup. Progress to regular foods as tolerated. Avoid greasy, spicy, heavy foods. If nausea and/or vomiting occur, drink only clear liquids until the nausea and/or vomiting subsides. Call your physician if vomiting continues.  Special Instructions/Symptoms: Your throat may feel dry or sore from the anesthesia or the breathing tube placed in your throat during surgery. If this causes discomfort, gargle with warm salt water. The discomfort should disappear within 24 hours.

## 2013-09-26 NOTE — Anesthesia Postprocedure Evaluation (Signed)
  Anesthesia Post-op Note  Patient: Jeffrey Buchanan  Procedure(s) Performed: Procedure(s) (LRB): BLADDER BIOPSY (N/A) CYSTOSCOPY WITH BILATERAL RETROGRADE PYELOGRAM (Bilateral)  Patient Location: PACU  Anesthesia Type: General  Level of Consciousness: awake and alert   Airway and Oxygen Therapy: Patient Spontanous Breathing  Post-op Pain: mild  Post-op Assessment: Post-op Vital signs reviewed, Patient's Cardiovascular Status Stable, Respiratory Function Stable, Patent Airway and No signs of Nausea or vomiting  Last Vitals:  Filed Vitals:   09/26/13 1615  BP: 139/72  Pulse: 82  Temp:   Resp: 21    Post-op Vital Signs: stable   Complications: No apparent anesthesia complications

## 2013-09-27 ENCOUNTER — Encounter (HOSPITAL_BASED_OUTPATIENT_CLINIC_OR_DEPARTMENT_OTHER): Payer: Self-pay | Admitting: Urology

## 2014-07-28 NOTE — H&P (Signed)
PATIENT NAME:  Jeffrey Buchanan, Jeffrey Buchanan MR#:  [id number] DATE OF BIRTH:  06/11/1955  DATE OF ADMISSION:  03/07/2013  HISTORY OF PRESENT ILLNESS: Mr. Quackenbush is a 59 year old white married gentleman followed by Dr. Ramonita Lab in internal medicine and Dr. Serafina Royals in cardiology, who presented to the Emergency Room after he developed pressure-like sensation in the left upper chest while at work this afternoon. It radiated up into the neck and down into his left arm. It was associated with mild diaphoresis. Although the patient states the pain was not severe, it was similar to the pain he had 10 years ago when he suffered a myocardial infarction. In the ER the patient EKG showed ST depression in the inferior leads, but was unchanged from April of this year. His initial two troponins are negative.   PAST MEDICAL HISTORY:   Coronary artery disease with myocardial infarction in 2005. He had a stent placed in the right coronary. The patient also has a history of hyperlipidemia, degenerative joint disease, gastroesophageal reflux, chronic anxiety, decreased libido and elevated PSA associated with benign prostatic hypertrophy.   ALLERGIES: THE PATIENT HAS A HISTORY OF INTOLERANCE TO LEXAPRO.   MEDICATIONS ON ADMISSION:  Aspirin 81 mg daily, Crestor 20 mg daily, Plavix 75 mg daily, omeprazole 20 mg daily, Xanax 0.25 mg 1 to 2 tablets b.i.d. p.r.n., Toprol-XL 50 mg daily and Norco 5/325 one tablet b.i.d. as needed.   SOCIAL HISTORY: He stopped smoking in 2005. He is a social drinker.   FAMILY HISTORY: Family history is obtained from his office chart and reveals that his father had diabetes and died of a stroke in his 63s. Mother died of metastatic cancer of unknown primary.   REVIEW OF SYSTEMS: Negative, as per ER questionnaire with the exception of the present illness.   PHYSICAL EXAMINATION:  VITAL SIGNS:  Temperature 97.7, pulse 69, blood pressure 147/89.  GENERAL: This is a middle-aged gentleman who at the  present time is in no acute distress.  SKIN: Normal in color and texture. There is no lymphadenopathy.   HEENT: Examination of head, ears, eyes, nose and throat was felt to be within normal limits.  NECK: Supple. There is no jugular venous distention. There are no carotid bruits.  LUNGS: Clear to auscultation and percussion.  CARDIAC: Regular rhythm without murmur, gallop, or rub. S1, S2 were normal.  ABDOMEN: Soft and nontender. Liver and spleen are not enlarged. Bowel sounds were normal.  EXTREMITIES: No edema or deformity.  NEUROLOGIC: Examination was felt to be physiological.   PLAN: The patient will be admitted to telemetry where cardiac enzymes will be cycled. His aspirin and Plavix will be held and he will be placed on a heparin drip pending further evaluation. Consultation will be requested from his cardiologist. He will tentatively be set up for a stress Myoview tomorrow pending cardiology consult.     ____________________________ Hewitt Blade. Sarina Ser, MD jbw:cc D: 03/07/2013 17:56:25 ET T: 03/07/2013 18:39:46 ET JOB#: [id number]  cc: Jenny Reichmann B. Sarina Ser, MD, <Dictator> Lottie Mussel III MD ELECTRONICALLY SIGNED 03/08/2013 6:14

## 2014-07-28 NOTE — Op Note (Signed)
PATIENT NAME:  Jeffrey Buchanan, Jeffrey Buchanan MR#:  [id number] DATE OF BIRTH:  07-27-1955  DATE OF PROCEDURE:  09/13/2012  PREOPERATIVE DIAGNOSIS:  Recurrent bladder tumor.   POSTOPERATIVE DIAGNOSIS:  Recurrent bladder tumor.    PROCEDURE:  Cysto resection of small bladder tumor with fulguration.   SURGEON:  Jeaneen Cala D. Elnoria Howard, DO   ANESTHESIA: General.   COMPLICATIONS: None.    PROCEDURE IN DETAIL:  With the patient sterilely prepped and draped in supine lithotomy position for ease of approach to the external genitalia, the procedure was begun. The patient has good relaxation from general anesthetic. A 21 French panendoscope was utilized to view the bladder. A small tumor is seen in the right posterior wall. The prostate is not grossly enlarged nor obstructed. Ureters are in a normal position, not near the tumor. It may have a normal orifice. I view the bladder; the rest of the bladder I find no other tumors. So with one tumor, which is small tumor, is completely resected bite of the cold cutting forceps and the area is fulgurated. The patient tolerates this well. There is no bleeding at the end of the procedure. Bladder was irrigated 2 or 3 times through the scope sheath and the bladder and there is no  sign of bleeding. B and O suppository is placed in his rectum. He sent to recovery in satisfactory condition.   ____________________________ Janice Coffin. Elnoria Howard, [place] rdh:cc D: 09/13/2012 16:02:22 ET T: 09/13/2012 17:32:11 ET JOB#: [id number]  cc: Janice Coffin. Elnoria Howard, DO, <Dictator> Omarian Jaquith D Haylea Schlichting DO ELECTRONICALLY SIGNED 10/01/2012 16:11

## 2014-07-28 NOTE — Discharge Summary (Signed)
PATIENT NAME:  Jeffrey Buchanan, Jeffrey Buchanan MR#:  [id number] DATE OF BIRTH:  29-Feb-1956  DATE OF ADMISSION:  03/07/2013 DATE OF DISCHARGE:  03/08/2013  FINAL DIAGNOSES:  1.  Chest pain, noncardiac, likely gastritis.  2.  Coronary artery disease.  3.  Anxiety.   HISTORY AND PHYSICAL: Please see dictated admission history and physical.  [place]: The patient was admitted with chest discomfort. Cardiac enzymes were negative. He was seen by cardiology and he proceeded to stress testing, which was negative. He was changed from omeprazole to pantoprazole, which was necessary secondary to his use of Clopidogrel. He tolerated this well had no further symptoms. At this point, we discharged him home in stable condition with his physical activity up as tolerated. He will follow a 2 gram sodium diet. We anticipate him following up in 1 to 2 weeks with Dr. Serafina Royals, at which time a decision can be made on whether to proceed with full cardiac catheterization to re-evaluate and a lot of this will be dependent on whether he has any further symptoms. He is comfortable with this plan.   DISCHARGE MEDICATIONS:  1.  Aspirin 81 mg p.o. daily.  2.  Crestor 20 mg p.o. at bedtime.  3.  Plavix 75 mg p.o. daily.  4.  Alprazolam 0.25 mg p.o. b.i.d. as needed.  5.  Toprol-XL 50 mg p.o. daily.  5.  Vitamin D 2000 units p.o. daily.  6.  Lisinopril 10 mg p.o. daily.  7.  Percocet 5/325 mg 1 p.o. q.8 hours as needed for severe back pain.  8.  Pantoprazole 40 mg p.o. daily.   He was given instructions to stop omeprazole as this has been replaced with pantoprazole.  ____________________________ [name], MD bjk:aw D: 03/09/2013 08:19:18 ET T: 03/09/2013 08:32:28 ET JOB#: [id number]  cc: Tama High III, MD, <Dictator> Ramonita Lab MD ELECTRONICALLY SIGNED 03/21/2013 22:15

## 2014-07-29 NOTE — Consult Note (Signed)
PATIENT NAME:  Jeffrey Buchanan, Jeffrey Buchanan MR#:  [id number] DATE OF BIRTH:  1956/02/19  DATE OF CONSULTATION:  03/08/2013  REFERRING PHYSICIAN:  John B. Sarina Ser, MD CONSULTING PHYSICIAN:  Vamsi Apfel D. Emiah Pellicano, MD  INDICATION: Unstable angina.   PRIMARY CARDIOLOGIST:  Corey Skains, MD  HISTORY OF PRESENT ILLNESS: Mr. Jeffrey Buchanan is a 59 year old white male, married, patient or Ramonita Lab, presents with recurrent chest pain symptoms at rest, pressure sensation which lasted a short time.  It was in his upper left chest while at work. The pain was similar to when he needed his bypass surgery so he was concerned with the radiation to his neck and down his left arm with mild diaphoresis. The pain was moderate in nature. He has had no recent symptoms in the prior 10 years so came to the Emergency Room for evaluation. EKG was abnormal at baseline with ST depression inferiorly but not significantly changed from previously so he was admitted for further evaluation and care. Cardiac enzymes were unremarkable. He has been set up for a Myoview.  PAST MEDICAL HISTORY: Coronary artery disease, myocardial infarction, hyperlipidemia, DJD, reflux, anxiety, erectile dysfunction, elevated PSA, BPH, borderline obesity.  FAMILY HISTORY: Diabetes, stroke, metastatic cancer.   SOCIAL HISTORY: Quit smoking, married, still works. Social drinker.    MEDICATIONS: Reportedly he is on aspirin 81 a day, Crestor 20 a day, Plavix 75 a day, omeprazole 20 a day, Xanax 0.25 mg 1 to 2 tablets b.i.d. p.r.n., Toprol-XL 50 a day, Norco 5/325 one tablet twice a day as needed. He is on lisinopril 10 mg a day, recently started by Dr. Nehemiah Massed.   ALLERGIES: [place].   REVIEW OF SYSTEMS: No blackout spells or syncope. No nausea or vomiting. No fever, no chills, no sweats. No weight loss, no weight gain. No hemoptysis or hematemesis. Denies bright red blood per rectum. No vision change, hearing change. Denies sputum production or cough.    PHYSICAL EXAMINATION: VITAL SIGNS:  Blood pressure 150/90, pulse 70, respiratory rate 16, afebrile.  HEENT: Normocephalic, atraumatic. Pupils equal and reactive to light.  NECK: Supple. No significant JVD, bruits or adenopathy.  LUNGS: Clear to auscultation and percussion. No significant wheeze, rhonchi or rale.  HEART:  Regular rate and rhythm.  Systolic ejection murmur left sternal border. Positive S4. PMI nondisplaced.  ABDOMEN: Benign.  EXTREMITIES: Within normal limits.  NEUROLOGIC: Intact.  SKIN:  Normal.  LABORATORY, DIAGNOSTIC AND RADIOLOGICAL DATA: Negative troponins. MET-B and CBC unremarkable. EKG normal sinus rhythm, diffuse nonspecific ST-T changes.   ASSESSMENT:  Possible unstable angina, known coronary artery disease, hypertension, hyperlipidemia, anxiety, gastroesophageal reflux disease, mild obesity, past history of smoking.   PLAN: 1.  Agree with admit. Rule out for myocardial infarction.  2.  Recommend exercise Myoview for evaluation of ischemia.  3.  Consider echocardiogram.  4.  Recommend aspirin 81 mg a day for secondary prevention.  5.  For hyperlipidemia, continue Crestor, lipid and liver studies as per primary.  6.  Patient has been maintained on Plavix for quite some time and patient feels more comfortable taking Plavix as precautionary with aspirin because of his known coronary disease.  7.  For GERD, continue omeprazole.  8.  Continue Xanax for anxiety.  9.  Patient is status post PCI and stent in 2005 after myocardial infarction. He has been maintained on Plavix ever since, now has recurrent symptoms that he considers to be similar to his anginal symptoms prior to the stent so we will proceed with  a noninvasive evaluation. Hopefully, it will be negative.  10.  Have the patient follow up with Dr. Nehemiah Massed as an outpatient.    ____________________________ Loran Senters. Clayborn Bigness, MD ddc:cs D: 03/08/2013 12:21:00 ET T: 03/08/2013 14:09:08  ET JOB#: [id number]  cc: Zeya Balles D. Clayborn Bigness, MD, <Dictator> Yolonda Kida MD ELECTRONICALLY SIGNED 04/13/2013 10:27

## 2015-11-16 ENCOUNTER — Ambulatory Visit (INDEPENDENT_AMBULATORY_CARE_PROVIDER_SITE_OTHER): Payer: BLUE CROSS/BLUE SHIELD | Admitting: Urology

## 2015-11-16 ENCOUNTER — Encounter: Payer: Self-pay | Admitting: Urology

## 2015-11-16 VITALS — BP 115/81 | HR 108 | Ht 73.5 in | Wt 237.1 lb

## 2015-11-16 DIAGNOSIS — R972 Elevated prostate specific antigen [PSA]: Secondary | ICD-10-CM

## 2015-11-16 DIAGNOSIS — N400 Enlarged prostate without lower urinary tract symptoms: Secondary | ICD-10-CM

## 2015-11-16 DIAGNOSIS — C679 Malignant neoplasm of bladder, unspecified: Secondary | ICD-10-CM | POA: Diagnosis not present

## 2015-11-16 MED ORDER — TAMSULOSIN HCL 0.4 MG PO CAPS: 0.4000 mg | ORAL_CAPSULE | Freq: Every day | ORAL | 11 refills | Status: DC

## 2015-11-16 NOTE — Progress Notes (Signed)
11/16/2015 3:57 PM   Jeffrey Buchanan 08/16/1955 [id number]  Referring provider: [name], MD [place] [facility] [place], [place] [address]  Chief Complaint  Patient presents with  . Elevated PSA    saw Dr. Elnoria Howard in 2015 referred by Dr. Caryl Comes    HPI: The patient is a 60 year old gentleman with a past metal history of low-grade TCC of the bladder presents today for elevated PSA.  1. Elevated PSA PSA has run in the 5 range for many years.    2. History of bladder cancer Status post TURBT for low-grade TCC of the bladder 2014.  Repeat TURBT in 2015 was unremarkable.  3. BPH No previous history of urinary problems. He notes urinary frequency, urgency, nocturia 3, incontinence, intermittency, weak stream. He states that this all happened when he underwent a TURBT.  PMH: Past Medical History:  Diagnosis Date  . At risk for sleep apnea    STOP-BANG= 5     SENT TO PCP 09-16-2013  . Bladder cancer ([place])   . Coronary artery disease CARDIOLOGIST-- DR Edyth Gunnels [facility])   MI  IN 2005--  S/P  PTCA  X2 STENTS  . GERD (gastroesophageal reflux disease)   . H/O hiatal hernia   . History of adenomatous polyp of colon    S/P  PARTIAL COLECTOMY 2006  . History of myocardial infarction    2005--  S/P  STENTING X2  . Hyperlipidemia   . S/P coronary artery stent placement    2005    Surgical History: Past Surgical History:  Procedure Laterality Date  . CARDIOVASCULAR STRESS TEST  2014  dr Nehemiah Massed   normal / no ischemia  . CORONARY ANGIOPLASTY WITH STENT PLACEMENT  01/ 2005   [facility]   STENT to RCA  . CYSTOSCOPY W/ RETROGRADES Bilateral 09/26/2013   Procedure: CYSTOSCOPY WITH BILATERAL RETROGRADE PYELOGRAM;  Surgeon: Sharyn Creamer, MD;  Location: [facility];  Service: Urology;  Laterality: Bilateral;  . OPEN REDUCTION SHOULDER DISLOCATION  1976  . PARTIAL COLECTOMY  2006  . TRANSTHORACIC ECHOCARDIOGRAM  12-17-2011  dr Nehemiah Massed   mild lv dysfunction/  ef 45%/  moderate lvh/  mild mitral & tricuspid insufficiencey  . TRANSURETHRAL RESECTION OF BLADDER TUMOR  2014    [facility]  . TRANSURETHRAL RESECTION OF BLADDER TUMOR WITH GYRUS (TURBT-GYRUS) N/A 09/26/2013   Procedure: BLADDER BIOPSY;  Surgeon: Sharyn Creamer, MD;  Location: [facility];  Service: Urology;  Laterality: N/A;    Home Medications:    Medication List       Accurate as of 11/16/15  3:57 PM. Always use your most recent med list.          ALPRAZolam 0.25 MG tablet Commonly known as:  XANAX Take 0.25 mg by mouth 3 (three) times daily as needed for anxiety.   aspirin EC 81 MG tablet Take by mouth.   cephALEXin 500 MG capsule Commonly known as:  KEFLEX Take 1 capsule (500 mg total) by mouth 2 (two) times daily.   clopidogrel 75 MG tablet Commonly known as:  PLAVIX Take by mouth.   hydrochlorothiazide 12.5 MG tablet Commonly known as:  HYDRODIURIL Take by mouth.   HYDROcodone-acetaminophen 5-325 MG tablet Commonly known as:  NORCO/VICODIN Take 1-2 tablets by mouth every 6 (six) hours as needed for moderate pain.   lisinopril 10 MG tablet Commonly known as:  PRINIVIL,ZESTRIL Take 10 mg by mouth every evening.   metoprolol succinate 50  MG 24 hr tablet Commonly known as:  TOPROL-XL Take 50 mg by mouth every evening. Take with or immediately following a meal.   oxybutynin 5 MG tablet Commonly known as:  DITROPAN Take 1 tablet (5 mg total) by mouth every 6 (six) hours as needed for bladder spasms.   pantoprazole 40 MG tablet Commonly known as:  PROTONIX Take 40 mg by mouth every morning.   phenazopyridine 200 MG tablet Commonly known as:  PYRIDIUM Take 1 tablet (200 mg total) by mouth 3 (three) times daily as needed for pain.   rosuvastatin 20 MG tablet Commonly known as:  CRESTOR Take 20 mg by mouth every evening.   senna-docusate 8.6-50 MG tablet Commonly known as:  SENOKOT S Take 1 tablet by mouth 2 (two) times  daily.   tamsulosin 0.4 MG Caps capsule Commonly known as:  FLOMAX Take 1 capsule (0.4 mg total) by mouth daily.   Vitamin D3 2000 units Tabs Take 1 capsule by mouth daily.       Allergies:  Allergies  Allergen Reactions  . Escitalopram Oxalate Other (See Comments)    Family History: Family History  Problem Relation Age of Onset  . Prostate cancer Brother   . Kidney disease Neg Hx     Social History:  reports that he quit smoking about 12 years ago. His smoking use included Cigarettes. He has a 32.00 pack-year smoking history. He has never used smokeless tobacco. He reports that he does not drink alcohol or use drugs.  ROS: UROLOGY Frequent Urination?: Yes Hard to postpone urination?: Yes Burning/pain with urination?: No Get up at night to urinate?: Yes Leakage of urine?: Yes Urine stream starts and stops?: Yes Trouble starting stream?: No Do you have to strain to urinate?: No Blood in urine?: No Urinary tract infection?: No Sexually transmitted disease?: No Injury to kidneys or bladder?: No Painful intercourse?: No Weak stream?: Yes Erection problems?: No Penile pain?: No  Gastrointestinal Nausea?: No Vomiting?: No Indigestion/heartburn?: No Diarrhea?: No Constipation?: No  Constitutional Fever: No Night sweats?: No Weight loss?: No Fatigue?: No  Skin Skin rash/lesions?: No Itching?: No  Eyes Blurred vision?: No Double vision?: No  Ears/Nose/Throat Sore throat?: No Sinus problems?: No  Hematologic/Lymphatic Swollen glands?: No Easy bruising?: No  Cardiovascular Leg swelling?: No Chest pain?: No  Respiratory Cough?: No Shortness of breath?: No  Endocrine Excessive thirst?: No  Musculoskeletal Back pain?: Yes Joint pain?: No  Neurological Headaches?: No Dizziness?: No  Psychologic Depression?: No Anxiety?: No  Physical Exam: BP 115/81   Pulse (!) 108   Ht 6' 1.5" (1.867 m)   Wt 237 lb 1.6 oz (107.5 kg)   BMI 30.86  kg/m   Constitutional:  Alert and oriented, No acute distress. HEENT: [place] AT, moist mucus membranes.  Trachea midline, no masses. Cardiovascular: No clubbing, cyanosis, or edema. Respiratory: Normal respiratory effort, no increased work of breathing. GI: Abdomen is soft, nontender, nondistended, no abdominal masses GU: No CVA tenderness. Phallus unremarkable. Testicles and equal bilaterally. No masses. Nontender palpation. DRE: 2+, smooth, benign. Skin: No rashes, bruises or suspicious lesions. Lymph: No cervical or inguinal adenopathy. Neurologic: Grossly intact, no focal deficits, moving all 4 extremities. Psychiatric: Normal mood and affect.  Laboratory Data: Lab Results  Component Value Date   WBC 8.6 03/07/2013   HGB 17.0 09/26/2013   HCT 50.0 09/26/2013   MCV 89 03/07/2013   PLT 184 03/07/2013    Lab Results  Component Value Date   CREATININE 0.93 03/07/2013  No results found for: PSA  No results found for: TESTOSTERONE  No results found for: HGBA1C  Urinalysis    Component Value Date/Time   COLORURINE Yellow 03/07/2013 1658   APPEARANCEUR Clear 03/07/2013 1658   LABSPEC 1.025 03/07/2013 1658   PHURINE 5.0 03/07/2013 1658   GLUCOSEU Negative 03/07/2013 1658   HGBUR Negative 03/07/2013 1658   BILIRUBINUR Negative 03/07/2013 1658   KETONESUR Negative 03/07/2013 1658   PROTEINUR Negative 03/07/2013 1658   NITRITE Negative 03/07/2013 1658   LEUKOCYTESUR Negative 03/07/2013 1658    Assessment & Plan:    1. History of elevated PSA We'll check PSA today.  2. History of bladder cancer We'll schedule cystoscopy  3. BPH We'll start Flomax 0.4 mg daily. The patient was warned of the risk of orthostatic hypotension and retrograde ejaculation.   Return in about 2 weeks (around 11/30/2015) for cystoscopy.  Nickie Retort, MD  [facility] [address] [place], [place] [address] [phone]

## 2015-11-17 LAB — PSA TOTAL (REFLEX TO FREE): Prostate Specific Ag, Serum: 3.9 ng/mL (ref 0.0–4.0)

## 2015-11-29 ENCOUNTER — Telehealth: Payer: Self-pay

## 2015-11-29 NOTE — Telephone Encounter (Signed)
Please let patient know PSA is normal. Follow up for cysto as scheduled -Dr. Bernita Buffy with pt in reference to PSA results. Pt voiced understanding.

## 2015-12-14 ENCOUNTER — Other Ambulatory Visit: Payer: BLUE CROSS/BLUE SHIELD

## 2016-01-08 ENCOUNTER — Telehealth: Payer: Self-pay

## 2016-01-08 NOTE — Telephone Encounter (Signed)
Pt called stating he was wanting to come off the flomax or change the medication. Pt stated that all of his s/s are much better but after he urinates he has leakage. Pt states this is a new problem that has started and is concerned about it. Please advise.

## 2016-01-09 NOTE — Telephone Encounter (Signed)
Spoke with pt in reference to needing a f/u cysto of bladder cancer. Pt stated that he knows he needs to f/u but he really just does not want to. Reinforced with pt the importance of f/u cysto. Also made pt aware can stop flomax and see how his s/s are. Pt voiced understanding of whole conversation. Pt was transferred to the front to make f/u cysto appt.

## 2016-01-09 NOTE — Telephone Encounter (Signed)
Patient is overdue for cystoscopy to follow up on his bladder cancer. He needs to come in for this. He can stop his flomax if he wants and restart if symptoms come back. In terms of his leakage, he may have a urethral stricture which can be evaluated for during his cystoscopy for bladder cancer.

## 2016-02-01 ENCOUNTER — Other Ambulatory Visit: Payer: BLUE CROSS/BLUE SHIELD

## 2016-02-21 ENCOUNTER — Ambulatory Visit (INDEPENDENT_AMBULATORY_CARE_PROVIDER_SITE_OTHER): Payer: BLUE CROSS/BLUE SHIELD | Admitting: Urology

## 2016-02-21 ENCOUNTER — Encounter: Payer: Self-pay | Admitting: Urology

## 2016-02-21 VITALS — BP 112/83 | HR 97 | Ht 74.0 in | Wt 240.5 lb

## 2016-02-21 DIAGNOSIS — N210 Calculus in bladder: Secondary | ICD-10-CM

## 2016-02-21 DIAGNOSIS — Z125 Encounter for screening for malignant neoplasm of prostate: Secondary | ICD-10-CM | POA: Diagnosis not present

## 2016-02-21 DIAGNOSIS — N401 Enlarged prostate with lower urinary tract symptoms: Secondary | ICD-10-CM | POA: Diagnosis not present

## 2016-02-21 DIAGNOSIS — C679 Malignant neoplasm of bladder, unspecified: Secondary | ICD-10-CM | POA: Diagnosis not present

## 2016-02-21 MED ORDER — TAMSULOSIN HCL 0.4 MG PO CAPS: 0.8000 mg | ORAL_CAPSULE | Freq: Every day | ORAL | 11 refills | Status: DC

## 2016-02-21 MED ORDER — CIPROFLOXACIN HCL 500 MG PO TABS
500.0000 mg | ORAL_TABLET | Freq: Once | ORAL | Status: AC
  Administered 2016-02-21: 500 mg via ORAL

## 2016-02-21 MED ORDER — FINASTERIDE 5 MG PO TABS: 5.0000 mg | ORAL_TABLET | Freq: Every day | ORAL | 11 refills | Status: DC

## 2016-02-21 MED ORDER — LIDOCAINE HCL 2 % EX GEL
1.0000 "application " | Freq: Once | CUTANEOUS | Status: AC
  Administered 2016-02-21: 1 via URETHRAL

## 2016-02-21 NOTE — Progress Notes (Signed)
02/21/2016 10:56 AM   Jeffrey Buchanan [birth year] [id number]  Referring provider: [name], MD [place] [facility] [place], [place] [address]  Chief Complaint  Patient presents with  . Cysto    bladder cancer     HPI: The patient is a 60 year old gentleman with a past metal history of low-grade TCC of the bladder presents today for elevated PSA.  1. History of Elevated PSA PSA has run in the 5 range for many years. PSA in August 2017 was 3.9  2. History of bladder cancer Status post TURBT for low-grade TCC of the bladder 2014.  Repeat TURBT in 2015 was unremarkable. Overdue for cystoscopy. No sign of tumor recurrence on today's cystoscopy  3. BPH No previous history of urinary problems. He notes urinary frequency, urgency, nocturia 3, incontinence, intermittency, weak stream. He states that this all happened when he underwent a TURBT.  Started on flomax at last visit which help sighlty. He still has postvoid dribbling and takes about 2 minutes to complete his urinary status cycle with intermittency. <12mm bladder stones  Seen on cystoscopy 11/17. Visually obstructive prostate. PVR: 0 cc   PMH: Past Medical History:  Diagnosis Date  . At risk for sleep apnea    STOP-BANG= 5     SENT TO PCP 09-16-2013  . Bladder cancer ([place])   . Coronary artery disease CARDIOLOGIST-- DR Edyth Gunnels [facility])   MI  IN 2005--  S/P  PTCA  X2 STENTS  . GERD (gastroesophageal reflux disease)   . H/O hiatal hernia   . History of adenomatous polyp of colon    S/P  PARTIAL COLECTOMY 2006  . History of myocardial infarction    2005--  S/P  STENTING X2  . Hyperlipidemia   . S/P coronary artery stent placement    2005    Surgical History: Past Surgical History:  Procedure Laterality Date  . CARDIOVASCULAR STRESS TEST  2014  dr Nehemiah Massed   normal / no ischemia  . CORONARY ANGIOPLASTY WITH STENT PLACEMENT  01/ 2005   [facility]   STENT to RCA  . CYSTOSCOPY W/  RETROGRADES Bilateral 09/26/2013   Procedure: CYSTOSCOPY WITH BILATERAL RETROGRADE PYELOGRAM;  Surgeon: Sharyn Creamer, MD;  Location: [facility];  Service: Urology;  Laterality: Bilateral;  . OPEN REDUCTION SHOULDER DISLOCATION  1976  . PARTIAL COLECTOMY  2006  . TRANSTHORACIC ECHOCARDIOGRAM  12-17-2011  dr Nehemiah Massed   mild lv dysfunction/  ef 45%/  moderate lvh/  mild mitral & tricuspid insufficiencey  . TRANSURETHRAL RESECTION OF BLADDER TUMOR  2014    [facility]  . TRANSURETHRAL RESECTION OF BLADDER TUMOR WITH GYRUS (TURBT-GYRUS) N/A 09/26/2013   Procedure: BLADDER BIOPSY;  Surgeon: Sharyn Creamer, MD;  Location: [facility];  Service: Urology;  Laterality: N/A;    Home Medications:    Medication List       Accurate as of 02/21/16 10:56 AM. Always use your most recent med list.          ALPRAZolam 0.25 MG tablet Commonly known as:  XANAX TAKE 1 TABLET BY MOUTH TWICE A DAY   aspirin EC 81 MG tablet Take by mouth.   cephALEXin 500 MG capsule Commonly known as:  KEFLEX Take 1 capsule (500 mg total) by mouth 2 (two) times daily.   clopidogrel 75 MG tablet Commonly known as:  PLAVIX Take by mouth.   finasteride 5 MG tablet Commonly known as:  PROSCAR Take 1 tablet (  5 mg total) by mouth daily.   hydrochlorothiazide 12.5 MG tablet Commonly known as:  HYDRODIURIL TAKE 1 TABLET (12.5 MG TOTAL) BY MOUTH ONCE DAILY.   HYDROcodone-acetaminophen 5-325 MG tablet Commonly known as:  NORCO/VICODIN Take by mouth.   lisinopril 10 MG tablet Commonly known as:  PRINIVIL,ZESTRIL Take 10 mg by mouth every evening.   metoprolol succinate 50 MG 24 hr tablet Commonly known as:  TOPROL-XL Take 50 mg by mouth every evening. Take with or immediately following a meal.   oxybutynin 5 MG tablet Commonly known as:  DITROPAN Take 1 tablet (5 mg total) by mouth every 6 (six) hours as needed for bladder spasms.   pantoprazole 40 MG tablet Commonly known as:   PROTONIX Take by mouth.   phenazopyridine 200 MG tablet Commonly known as:  PYRIDIUM Take 1 tablet (200 mg total) by mouth 3 (three) times daily as needed for pain.   rosuvastatin 20 MG tablet Commonly known as:  CRESTOR Take 20 mg by mouth every evening.   senna-docusate 8.6-50 MG tablet Commonly known as:  SENOKOT S Take 1 tablet by mouth 2 (two) times daily.   tamsulosin 0.4 MG Caps capsule Commonly known as:  FLOMAX TAKE 1 CAPSULE (0.4 MG TOTAL) BY MOUTH DAILY.   tamsulosin 0.4 MG Caps capsule Commonly known as:  FLOMAX Take 2 capsules (0.8 mg total) by mouth daily.   Vitamin D3 2000 units Tabs Take 1 capsule by mouth daily.       Allergies:  Allergies  Allergen Reactions  . Escitalopram Oxalate Other (See Comments)    Family History: Family History  Problem Relation Age of Onset  . Prostate cancer Brother   . Kidney disease Neg Hx     Social History:  reports that he quit smoking about 12 years ago. His smoking use included Cigarettes. He has a 32.00 pack-year smoking history. He has never used smokeless tobacco. He reports that he does not drink alcohol or use drugs.  ROS:                                        Physical Exam: BP 112/83   Pulse 97   Ht 6\' 2"  (1.88 m)   Wt 240 lb 8 oz (109.1 kg)   BMI 30.88 kg/m   Constitutional:  Alert and oriented, No acute distress. HEENT: [place] AT, moist mucus membranes.  Trachea midline, no masses. Cardiovascular: No clubbing, cyanosis, or edema. Respiratory: Normal respiratory effort, no increased work of breathing. GI: Abdomen is soft, nontender, nondistended, no abdominal masses GU: No CVA tenderness.  Skin: No rashes, bruises or suspicious lesions. Lymph: No cervical or inguinal adenopathy. Neurologic: Grossly intact, no focal deficits, moving all 4 extremities. Psychiatric: Normal mood and affect.  Laboratory Data: Lab Results  Component Value Date   WBC 8.6 03/07/2013   HGB 17.0  09/26/2013   HCT 50.0 09/26/2013   MCV 89 03/07/2013   PLT 184 03/07/2013    Lab Results  Component Value Date   CREATININE 0.93 03/07/2013    No results found for: PSA  No results found for: TESTOSTERONE  No results found for: HGBA1C  Urinalysis    Component Value Date/Time   COLORURINE Yellow 03/07/2013 1658   APPEARANCEUR Clear 03/07/2013 1658   LABSPEC 1.025 03/07/2013 1658   PHURINE 5.0 03/07/2013 1658   GLUCOSEU Negative 03/07/2013 1658   HGBUR Negative 03/07/2013  [place] Negative 03/07/2013 1658   KETONESUR Negative 03/07/2013 1658   PROTEINUR Negative 03/07/2013 1658   NITRITE Negative 03/07/2013 1658   LEUKOCYTESUR Negative 03/07/2013 1658      Cystoscopy Procedure Note  Patient identification was confirmed, informed consent was obtained, and patient was prepped using Betadine solution.  Lidocaine jelly was administered per urethral meatus.    Preoperative abx where received prior to procedure.     Pre-Procedure: - Inspection reveals a normal caliber ureteral meatus.  Procedure: The flexible cystoscope was introduced without difficulty - No urethral strictures/lesions are present. - Enlarged prostate  7 cm visually obstructive - Normal bladder neck - Bilateral ureteral orifices identified - Bladder mucosa  reveals no ulcers, tumors, or lesions - Small bladder stones (<50mm) noted in bladder - No trabeculation  Retroflexion shows no intravesical lobe   Post-Procedure: - Patient tolerated the procedure well   Assessment & Plan:    1. History of elevated PSA We'll check PSA today.  2. History of bladder cancer Cystoscopy normal today. Due for repeat cystoscopy in one year  3. BPH Increase flomax to 0.8 mg daily. Add finasteride. Made need surgical management if patient does not respond to medications.  4. Bladder stones -2/2 to BPH/imcomplete emptying.    Return in about 3 months (around 05/23/2016).  Nickie Retort,  MD  [facility] [address] [place], [place] [address] [phone]

## 2016-03-03 ENCOUNTER — Telehealth: Payer: Self-pay | Admitting: Urology

## 2016-03-03 DIAGNOSIS — N401 Enlarged prostate with lower urinary tract symptoms: Secondary | ICD-10-CM

## 2016-03-03 MED ORDER — TAMSULOSIN HCL 0.4 MG PO CAPS: 0.8000 mg | ORAL_CAPSULE | Freq: Two times a day (BID) | ORAL | 11 refills | Status: DC

## 2016-03-03 NOTE — Telephone Encounter (Signed)
Pt called and said Dr. Pilar Jarvis wanted him to increase Tamsulosin .4 mg to 2 pills per day, but RX was only for 30 pills and should be for 60 per month.  He has 15 refills, so needs to be changed at [facility] on [address].

## 2016-03-03 NOTE — Telephone Encounter (Signed)
Medication sent to pharmacy  

## 2016-05-19 ENCOUNTER — Telehealth: Payer: Self-pay

## 2016-05-19 NOTE — Telephone Encounter (Signed)
Pt called wanting to reschedule his f/u appt on this Thursday, Feb 15th  to May. He was put on finasteride and you increased his Flomax for his BPH x 3 mths ago. He states he have not notice much improvement and was told by his PCP that the Finasteride can take up to 13mths before he notice any changes. Please advise

## 2016-05-20 NOTE — Telephone Encounter (Signed)
Jeffrey Retort, MD  Jeffrey Buchanan, CMA  Caller: Unspecified (Yesterday, 11:07 AM)        His PCP is right. Finasteride can take up to 6 months for full effect. He should be experiencing maximum effect of increased flomax by now though. It is ok for him to wait until May for follow up.    The pt was informed. Appt rescheduled to May.

## 2016-05-22 ENCOUNTER — Ambulatory Visit: Payer: BLUE CROSS/BLUE SHIELD

## 2016-08-01 ENCOUNTER — Telehealth: Payer: Self-pay | Admitting: Urology

## 2016-08-01 NOTE — Telephone Encounter (Signed)
Pt is in a lot of pain.  He thinks it's his bladder and groin area.  He wants to know what to do.  It's a sharp, sickening pain.  He has nausea also.  He has pain meds, but they are not working.  Please give pt a call [phone]

## 2016-08-01 NOTE — Telephone Encounter (Signed)
Spoke with pt in reference to c/o pain. Pt stated at this time the pain has gone away and he feels ok. Reinforced with pt to call back should the s/s come back. Pt voiced understanding.

## 2016-08-22 ENCOUNTER — Ambulatory Visit: Payer: BLUE CROSS/BLUE SHIELD

## 2016-09-11 ENCOUNTER — Encounter: Payer: Self-pay | Admitting: Urology

## 2016-09-11 ENCOUNTER — Ambulatory Visit (INDEPENDENT_AMBULATORY_CARE_PROVIDER_SITE_OTHER): Payer: BLUE CROSS/BLUE SHIELD | Admitting: Urology

## 2016-09-11 VITALS — BP 102/63 | HR 101 | Ht 74.0 in | Wt 240.5 lb

## 2016-09-11 DIAGNOSIS — N210 Calculus in bladder: Secondary | ICD-10-CM | POA: Diagnosis not present

## 2016-09-11 DIAGNOSIS — C678 Malignant neoplasm of overlapping sites of bladder: Secondary | ICD-10-CM

## 2016-09-11 DIAGNOSIS — N400 Enlarged prostate without lower urinary tract symptoms: Secondary | ICD-10-CM | POA: Diagnosis not present

## 2016-09-11 DIAGNOSIS — R972 Elevated prostate specific antigen [PSA]: Secondary | ICD-10-CM

## 2016-09-11 NOTE — Progress Notes (Signed)
09/11/2016 11:48 AM   Jeffrey Buchanan [birth year] [id number]  Referring provider: [name], MD [facility] [place], [place] [address]  Chief Complaint  Patient presents with  . Elevated PSA    HPI: The patient is a 61 year old gentleman with a past metal history of low-grade TCC of the bladder presents today for follow up.  1. History of Elevated PSA PSA has run in the 5 range for many years. PSA in August 2017 was 3.9  2. History of bladder cancer Status post TURBT for low-grade TCC of the bladder 2014. Repeat TURBT in 2015 was unremarkable. Overdue for cystoscopy. No sign of tumor recurrence on today's cystoscopy. Due for cystoscopy in November  3. BPH Currently on Flomax 0.8 mg and finasteride. I PSS score is 17/4. He has nocturia 2-3 times per night. He is a very weak stream as his biggest complaint. He also has incomplete emptying, frequency, intermittency, urgency. He does occasionally strain. He is mostly dissatisfied with his urination. Despite this, he is very happy with his improvement in symptoms. He feels he is much better shape than he was prior to starting these medications. He is not currently interested in undergoing a surgical procedure.   <50mm bladder stones and 7 cm visually obstructive prostate seen on cystoscopy 11/17. Visually obstructive prostate. PVR: 0 cc     4. Bladder stones Small <57mm bladder stones noted on cystoscopy in November 2017.   PMH: Past Medical History:  Diagnosis Date  . At risk for sleep apnea    STOP-BANG= 5     SENT TO PCP 09-16-2013  . Bladder cancer ([place])   . Coronary artery disease CARDIOLOGIST-- DR Edyth Gunnels Adonis [place])   MI  IN 2005--  S/P  PTCA  X2 STENTS  . GERD (gastroesophageal reflux disease)   . H/O hiatal hernia   . History of adenomatous polyp of colon    S/P  PARTIAL COLECTOMY 2006  . History of myocardial infarction    2005--  S/P  STENTING X2  . Hyperlipidemia   . S/P  coronary artery stent placement    2005    Surgical History: Past Surgical History:  Procedure Laterality Date  . CARDIOVASCULAR STRESS TEST  2014  dr Nehemiah Massed   normal / no ischemia  . CORONARY ANGIOPLASTY WITH STENT PLACEMENT  01/ 2005   [facility]   STENT to RCA  . CYSTOSCOPY W/ RETROGRADES Bilateral 09/26/2013   Procedure: CYSTOSCOPY WITH BILATERAL RETROGRADE PYELOGRAM;  Surgeon: Sharyn Creamer, MD;  Location: [facility];  Service: Urology;  Laterality: Bilateral;  . OPEN REDUCTION SHOULDER DISLOCATION  1976  . PARTIAL COLECTOMY  2006  . TRANSTHORACIC ECHOCARDIOGRAM  12-17-2011  dr Nehemiah Massed   mild lv dysfunction/  ef 45%/  moderate lvh/  mild mitral & tricuspid insufficiencey  . TRANSURETHRAL RESECTION OF BLADDER TUMOR  2014    [facility]  . TRANSURETHRAL RESECTION OF BLADDER TUMOR WITH GYRUS (TURBT-GYRUS) N/A 09/26/2013   Procedure: BLADDER BIOPSY;  Surgeon: Sharyn Creamer, MD;  Location: [facility];  Service: Urology;  Laterality: N/A;    Home Medications:  Allergies as of 09/11/2016      Reactions   Escitalopram Oxalate Other (See Comments)      Medication List       Accurate as of 09/11/16 11:48 AM. Always use your most recent med list.          ALPRAZolam 0.25 MG tablet Commonly known as:  XANAX TAKE 1 TABLET BY MOUTH TWICE A DAY   aspirin EC 81 MG tablet Take by mouth.   clopidogrel 75 MG tablet Commonly known as:  PLAVIX Take by mouth.   finasteride 5 MG tablet Commonly known as:  PROSCAR Take 1 tablet (5 mg total) by mouth daily.   hydrochlorothiazide 12.5 MG tablet Commonly known as:  HYDRODIURIL TAKE 1 TABLET (12.5 MG TOTAL) BY MOUTH ONCE DAILY.   HYDROcodone-acetaminophen 5-325 MG tablet Commonly known as:  NORCO/VICODIN Take by mouth.   lisinopril 10 MG tablet Commonly known as:  PRINIVIL,ZESTRIL Take 10 mg by mouth every evening.   metoprolol succinate 50 MG 24 hr tablet Commonly known as:  TOPROL-XL Take 50 mg by  mouth every evening. Take with or immediately following a meal.   oxybutynin 5 MG tablet Commonly known as:  DITROPAN Take 1 tablet (5 mg total) by mouth every 6 (six) hours as needed for bladder spasms.   pantoprazole 40 MG tablet Commonly known as:  PROTONIX Take by mouth.   phenazopyridine 200 MG tablet Commonly known as:  PYRIDIUM Take 1 tablet (200 mg total) by mouth 3 (three) times daily as needed for pain.   rosuvastatin 20 MG tablet Commonly known as:  CRESTOR Take 20 mg by mouth every evening.   senna-docusate 8.6-50 MG tablet Commonly known as:  SENOKOT S Take 1 tablet by mouth 2 (two) times daily.   tamsulosin 0.4 MG Caps capsule Commonly known as:  FLOMAX Take 2 capsules (0.8 mg total) by mouth 2 (two) times daily.   Vitamin D3 2000 units Tabs Take 1 capsule by mouth daily.       Allergies:  Allergies  Allergen Reactions  . Escitalopram Oxalate Other (See Comments)    Family History: Family History  Problem Relation Age of Onset  . Prostate cancer Brother   . Kidney disease Neg Hx     Social History:  reports that he quit smoking about 12 years ago. His smoking use included Cigarettes. He has a 32.00 pack-year smoking history. He has never used smokeless tobacco. He reports that he does not drink alcohol or use drugs.  ROS: UROLOGY Frequent Urination?: No Hard to postpone urination?: Yes Burning/pain with urination?: No Get up at night to urinate?: Yes Leakage of urine?: No Urine stream starts and stops?: Yes Trouble starting stream?: No Do you have to strain to urinate?: No Blood in urine?: No Urinary tract infection?: No Sexually transmitted disease?: No Injury to kidneys or bladder?: No Painful intercourse?: No Weak stream?: Yes Erection problems?: Yes Penile pain?: No  Gastrointestinal Nausea?: No Vomiting?: No Indigestion/heartburn?: No Diarrhea?: No Constipation?: No  Constitutional Fever: No Night sweats?: No Weight loss?:  No Fatigue?: No  Skin Skin rash/lesions?: No Itching?: No  Eyes Blurred vision?: No Double vision?: No  Ears/Nose/Throat Sore throat?: No Sinus problems?: No  Hematologic/Lymphatic Swollen glands?: No Easy bruising?: No  Cardiovascular Leg swelling?: No Chest pain?: No  Respiratory Cough?: No Shortness of breath?: No  Endocrine Excessive thirst?: No  Musculoskeletal Back pain?: Yes Joint pain?: No  Neurological Headaches?: No Dizziness?: Yes  Psychologic Depression?: No Anxiety?: No  Physical Exam: BP 102/63 (BP Location: Left Arm, Patient Position: Sitting, Cuff Size: Normal)   Pulse (!) 101   Ht 6\' 2"  (1.88 m)   Wt 240 lb 8 oz (109.1 kg)   BMI 30.88 kg/m   Constitutional:  Alert and oriented, No acute distress. HEENT: [place] AT, moist mucus membranes.  Trachea midline,  no masses. Cardiovascular: No clubbing, cyanosis, or edema. Respiratory: Normal respiratory effort, no increased work of breathing. GI: Abdomen is soft, nontender, nondistended, no abdominal masses GU: No CVA tenderness.  Skin: No rashes, bruises or suspicious lesions. Lymph: No cervical or inguinal adenopathy. Neurologic: Grossly intact, no focal deficits, moving all 4 extremities. Psychiatric: Normal mood and affect.  Laboratory Data: Lab Results  Component Value Date   WBC 8.6 03/07/2013   HGB 17.0 09/26/2013   HCT 50.0 09/26/2013   MCV 89 03/07/2013   PLT 184 03/07/2013    Lab Results  Component Value Date   CREATININE 0.93 03/07/2013    No results found for: PSA  No results found for: TESTOSTERONE  No results found for: HGBA1C  Urinalysis    Component Value Date/Time   COLORURINE Yellow 03/07/2013 1658   APPEARANCEUR Clear 03/07/2013 1658   LABSPEC 1.025 03/07/2013 1658   PHURINE 5.0 03/07/2013 1658   GLUCOSEU Negative 03/07/2013 1658   HGBUR Negative 03/07/2013 1658   BILIRUBINUR Negative 03/07/2013 1658   KETONESUR Negative 03/07/2013 1658   PROTEINUR  Negative 03/07/2013 1658   NITRITE Negative 03/07/2013 1658   LEUKOCYTESUR Negative 03/07/2013 1658     Assessment & Plan:  *  1. History of elevated PSA We'll check PSA today. Patient will have drawn at wellness center at work. Rx given  2. History of bladder cancer Due for repeat cystoscopy in November 2018  3. BPH Continue flomax to 0.8 mg and finasteride daily. Patient not interested in surgical correction.  4. Bladder stones -2/2 to BPH/imcomplete emptying. Will reassess size at next cystoscopy.   Return in about 6 months (around 03/13/2017) for cystoscopy.  Nickie Retort, MD  [facility] [address], [place], [place] [address] [phone]

## 2017-03-03 ENCOUNTER — Encounter: Payer: Self-pay | Admitting: *Deleted

## 2017-03-04 ENCOUNTER — Ambulatory Visit: Payer: BLUE CROSS/BLUE SHIELD | Admitting: Anesthesiology

## 2017-03-04 ENCOUNTER — Ambulatory Visit
Admission: RE | Admit: 2017-03-04 | Discharge: 2017-03-04 | Disposition: A | Discharge status: Home / Self Care | Payer: BLUE CROSS/BLUE SHIELD | Source: Ambulatory Visit | Attending: Internal Medicine | Admitting: Internal Medicine

## 2017-03-04 ENCOUNTER — Encounter: Payer: Self-pay | Admitting: *Deleted

## 2017-03-04 ENCOUNTER — Encounter
Admission: RE | Disposition: A | Discharge status: Home / Self Care | Payer: Self-pay | Source: Ambulatory Visit | Attending: Internal Medicine

## 2017-03-04 DIAGNOSIS — Z1211 Encounter for screening for malignant neoplasm of colon: Secondary | ICD-10-CM | POA: Insufficient documentation

## 2017-03-04 DIAGNOSIS — Z86010 Personal history of colonic polyps: Secondary | ICD-10-CM | POA: Insufficient documentation

## 2017-03-04 DIAGNOSIS — K210 Gastro-esophageal reflux disease with esophagitis: Secondary | ICD-10-CM | POA: Diagnosis not present

## 2017-03-04 DIAGNOSIS — K640 First degree hemorrhoids: Secondary | ICD-10-CM | POA: Diagnosis not present

## 2017-03-04 DIAGNOSIS — I2510 Atherosclerotic heart disease of native coronary artery without angina pectoris: Secondary | ICD-10-CM | POA: Insufficient documentation

## 2017-03-04 DIAGNOSIS — I10 Essential (primary) hypertension: Secondary | ICD-10-CM | POA: Diagnosis not present

## 2017-03-04 DIAGNOSIS — J449 Chronic obstructive pulmonary disease, unspecified: Secondary | ICD-10-CM | POA: Insufficient documentation

## 2017-03-04 DIAGNOSIS — Z79899 Other long term (current) drug therapy: Secondary | ICD-10-CM | POA: Diagnosis not present

## 2017-03-04 DIAGNOSIS — D123 Benign neoplasm of transverse colon: Secondary | ICD-10-CM | POA: Insufficient documentation

## 2017-03-04 DIAGNOSIS — Z87891 Personal history of nicotine dependence: Secondary | ICD-10-CM | POA: Diagnosis not present

## 2017-03-04 DIAGNOSIS — D122 Benign neoplasm of ascending colon: Secondary | ICD-10-CM | POA: Insufficient documentation

## 2017-03-04 DIAGNOSIS — D120 Benign neoplasm of cecum: Secondary | ICD-10-CM | POA: Insufficient documentation

## 2017-03-04 DIAGNOSIS — Z7982 Long term (current) use of aspirin: Secondary | ICD-10-CM | POA: Diagnosis not present

## 2017-03-04 DIAGNOSIS — Z85038 Personal history of other malignant neoplasm of large intestine: Secondary | ICD-10-CM | POA: Diagnosis not present

## 2017-03-04 DIAGNOSIS — K2950 Unspecified chronic gastritis without bleeding: Secondary | ICD-10-CM | POA: Insufficient documentation

## 2017-03-04 HISTORY — PX: COLONOSCOPY WITH PROPOFOL: SHX5780

## 2017-03-04 HISTORY — DX: Myoneural disorder, unspecified: G70.9

## 2017-03-04 HISTORY — DX: Essential (primary) hypertension: I10

## 2017-03-04 HISTORY — DX: Headache, unspecified: R51.9

## 2017-03-04 HISTORY — DX: Personal history of urinary calculi: Z87.442

## 2017-03-04 HISTORY — DX: Unspecified osteoarthritis, unspecified site: M19.90

## 2017-03-04 HISTORY — PX: ESOPHAGOGASTRODUODENOSCOPY (EGD) WITH PROPOFOL: SHX5813

## 2017-03-04 HISTORY — DX: Chronic obstructive pulmonary disease, unspecified: J44.9

## 2017-03-04 HISTORY — DX: Headache: R51

## 2017-03-04 HISTORY — DX: Anxiety disorder, unspecified: F41.9

## 2017-03-04 SURGERY — COLONOSCOPY WITH PROPOFOL
Anesthesia: General

## 2017-03-04 MED ORDER — PROPOFOL 10 MG/ML IV BOLUS
INTRAVENOUS | Status: AC
  Filled 2017-03-04: qty 20

## 2017-03-04 MED ORDER — PROPOFOL 500 MG/50ML IV EMUL
INTRAVENOUS | Status: AC
  Filled 2017-03-04: qty 50

## 2017-03-04 MED ORDER — PROPOFOL 10 MG/ML IV BOLUS
INTRAVENOUS | Status: DC | PRN
Start: 2017-03-04 — End: 2017-03-04
  Administered 2017-03-04: 80 mg via INTRAVENOUS

## 2017-03-04 MED ORDER — LIDOCAINE HCL (PF) 2 % IJ SOLN
INTRAMUSCULAR | Status: AC
  Filled 2017-03-04: qty 10

## 2017-03-04 MED ORDER — PROPOFOL 500 MG/50ML IV EMUL
INTRAVENOUS | Status: DC | PRN
  Administered 2017-03-04: 160 ug/kg/min via INTRAVENOUS

## 2017-03-04 MED ORDER — LIDOCAINE HCL (CARDIAC) 20 MG/ML IV SOLN
INTRAVENOUS | Status: DC | PRN
  Administered 2017-03-04: 50 mg via INTRAVENOUS

## 2017-03-04 MED ORDER — SODIUM CHLORIDE 0.9 % IV SOLN
INTRAVENOUS | Status: DC
  Administered 2017-03-04: 1000 mL via INTRAVENOUS

## 2017-03-04 NOTE — Transfer of Care (Signed)
Immediate Anesthesia Transfer of Care Note  Patient: Jeffrey Buchanan  Procedure(s) Performed: COLONOSCOPY WITH PROPOFOL (N/A ) ESOPHAGOGASTRODUODENOSCOPY (EGD) WITH PROPOFOL (N/A )  Patient Location: PACU  Anesthesia Type:General  Level of Consciousness: awake and alert   Airway & Oxygen Therapy: Patient Spontanous Breathing and Patient connected to nasal cannula oxygen  Post-op Assessment: Report given to RN and Post -op Vital signs reviewed and stable  Post vital signs: Reviewed and stable  Last Vitals:  Vitals:   03/04/17 1418 03/04/17 1552  BP: (!) 160/92   Pulse: 83   Resp: 20   Temp: 36.6 C (!) 36.2 C    Last Pain:  Vitals:   03/04/17 1552  TempSrc: Tympanic      Patients Stated Pain Goal: 0 (92/33/00 7622)  Complications: No apparent anesthesia complications

## 2017-03-04 NOTE — Op Note (Signed)
[facility] Patient Name: Jeffrey Buchanan Procedure Date: 03/04/2017 2:49 PM MRN: [id number] Account #: [id number] Date of Birth: 01-30-1956 Admit Type: Outpatient Age: 61 Room: [facility] ENDO ROOM 4 Gender: Male Note Status: Finalized Procedure:            Upper GI endoscopy Indications:          Esophageal reflux symptoms that persist despite                        appropriate therapy Providers:            Benay Pike. Alice Reichert MD, MD Referring MD:         Ramonita Lab, MD (Referring MD) Medicines:            Propofol per Anesthesia Complications:        No immediate complications. Procedure:            Pre-Anesthesia Assessment:                       - The risks and benefits of the procedure and the                        sedation options and risks were discussed with the                        patient. All questions were answered and informed                        consent was obtained.                       - Patient identification and proposed procedure were                        verified prior to the procedure by the nurse. The                        procedure was verified in the procedure room.                       - ASA Grade Assessment: II - A patient with mild                        systemic disease.                       - After reviewing the risks and benefits, the patient                        was deemed in satisfactory condition to undergo the                        procedure.                       After obtaining informed consent, the endoscope was                        passed under direct vision. Throughout the procedure,  the patient's blood pressure, pulse, and oxygen                        saturations were monitored continuously. The                        Colonoscope was introduced through the mouth, and                        advanced to the third part of duodenum. The upper GI   endoscopy was accomplished without difficulty. The                        patient tolerated the procedure well. Findings:      The Z-line was irregular and was found 39 cm from the incisors. Biopsies       were taken with a cold forceps for histology.      Normal mucosa was found in the upper third of the esophagus and in the       middle third of the esophagus. Biopsies were taken with a cold forceps       for histology.      Normal mucosa was found in the upper third of the esophagus. Biopsies       were obtained from the proximal and distal esophagus with cold forceps       for histology of suspected eosinophilic esophagitis.      Localized mildly erythematous mucosa without bleeding was found in the       gastric antrum. Biopsies were taken with a cold forceps for Helicobacter       pylori testing.      The examined duodenum was normal. Impression:           - Z-line irregular, 39 cm from the incisors. Biopsied.                       - Normal mucosa was found in the upper third of the                        esophagus and in the middle third of the esophagus.                        Biopsied.                       - Normal mucosa was found in the upper third of the                        esophagus. Biopsied.                       - Erythematous mucosa in the antrum. Biopsied.                       - Normal examined duodenum. Recommendation:       - Await pathology results.                       - See the other procedure note for documentation of                        additional recommendations. Procedure Code(s):    ---  Professional ---                       [id number], Esophagogastroduodenoscopy, flexible, transoral;                        with biopsy, single or multiple Diagnosis Code(s):    --- Professional ---                       K21.9, Gastro-esophageal reflux disease without                        esophagitis                       K31.89, Other diseases of stomach and duodenum                        K22.8, Other specified diseases of esophagus CPT copyright 2016 American Medical Association. All rights reserved. The codes documented in this report are preliminary and upon coder review may  be revised to meet current compliance requirements. Efrain Sella MD, MD 03/04/2017 3:19:32 PM This report has been signed electronically. Number of Addenda: 0 Note Initiated On: 03/04/2017 2:49 PM      [facility]

## 2017-03-04 NOTE — Anesthesia Post-op Follow-up Note (Signed)
Anesthesia QCDR form completed.        

## 2017-03-04 NOTE — Anesthesia Preprocedure Evaluation (Signed)
Anesthesia Evaluation  Patient identified by MRN, date of birth, ID band Patient awake    Reviewed: Allergy & Precautions, H&P , NPO status , Patient's Chart, lab work & pertinent test results, reviewed documented beta blocker date and time   Airway Mallampati: II   Neck ROM: full    Dental  (+) Teeth Intact   Pulmonary neg pulmonary ROS, COPD, former smoker,    Pulmonary exam normal        Cardiovascular hypertension, + CAD  negative cardio ROS Normal cardiovascular exam Rhythm:regular Rate:Normal  Recently seen and deemed stable by Dr. Nehemiah Massed. JA   Neuro/Psych  Headaches,  Neuromuscular disease negative neurological ROS  negative psych ROS   GI/Hepatic negative GI ROS, Neg liver ROS, hiatal hernia, GERD  Medicated,  Endo/Other  negative endocrine ROS  Renal/GU negative Renal ROS  negative genitourinary   Musculoskeletal   Abdominal   Peds  Hematology negative hematology ROS (+)   Anesthesia Other Findings Past Medical History: No date: Anxiety No date: Arthritis No date: At risk for sleep apnea     Comment:  STOP-BANG= 5     SENT TO PCP 09-16-2013 No date: Bladder cancer ([place]) No date: COPD (chronic obstructive pulmonary disease) ([place]) CARDIOLOGIST-- DR Edyth Gunnels Adonis Brook): Coronary artery disease     Comment:  MI  IN 2005--  S/P  PTCA  X2 STENTS No date: DJD (degenerative joint disease) No date: GERD (gastroesophageal reflux disease) No date: H/O hiatal hernia No date: Headache No date: History of adenomatous polyp of colon     Comment:  S/P  PARTIAL COLECTOMY 2006 No date: History of kidney stones No date: History of myocardial infarction     Comment:  2005--  S/P  STENTING X2 No date: Hyperlipidemia No date: Hypertension No date: Neuromuscular disorder ([place]) No date: S/P coronary artery stent placement     Comment:  2005 Past Surgical History: 2014  dr Nehemiah Massed: CARDIOVASCULAR STRESS TEST  Comment:  normal / no ischemia 01/ 2005   [place]: CORONARY ANGIOPLASTY WITH STENT PLACEMENT     Comment:  STENT to RCA 09/26/2013: CYSTOSCOPY W/ RETROGRADES; Bilateral     Comment:  Procedure: CYSTOSCOPY WITH BILATERAL RETROGRADE               PYELOGRAM;  Surgeon: Sharyn Creamer, MD;  Location:               [facility];  Service: Urology;                Laterality: Bilateral; 1976: OPEN REDUCTION SHOULDER DISLOCATION 2006: PARTIAL COLECTOMY 12-17-2011  dr Nehemiah Massed: TRANSTHORACIC ECHOCARDIOGRAM     Comment:  mild lv dysfunction/  ef 45%/  moderate lvh/  mild               mitral & tricuspid insufficiencey 2014    [facility]: TRANSURETHRAL RESECTION OF BLADDER TUMOR 09/26/2013: TRANSURETHRAL RESECTION OF BLADDER TUMOR WITH GYRUS (TURBT- GYRUS); N/A     Comment:  Procedure: BLADDER BIOPSY;  Surgeon: Sharyn Creamer,               MD;  Location: [facility];  Service:               Urology;  Laterality: N/A;   Reproductive/Obstetrics negative OB ROS                             Anesthesia Physical Anesthesia Plan  ASA: III  Anesthesia Plan: General   Post-op Pain Management:    Induction:   PONV Risk Score and Plan:   Airway Management Planned:   Additional Equipment:   Intra-op Plan:   Post-operative Plan:   Informed Consent: I have reviewed the patients History and Physical, chart, labs and discussed the procedure including the risks, benefits and alternatives for the proposed anesthesia with the patient or authorized representative who has indicated his/her understanding and acceptance.   Dental Advisory Given  Plan Discussed with: CRNA  Anesthesia Plan Comments:         Anesthesia Quick Evaluation

## 2017-03-04 NOTE — H&P (Signed)
Outpatient short stay form Pre-procedure 03/04/2017 2:55 PM Teodoro K. Alice Reichert, M.D.  Primary Physician: Ramonita Lab , M.D.  Reason for visit:  Severe GERD with breakthrough on PPI, personal hx of colon cancer in a polyp s/p partial colectomy.  History of present illness: As above. Patient presents for EGD and colonoscopy for screening/surveillance. The patient denies complaints of abdominal pain, significant change in bowel habits, or rectal bleeding.     Current Facility-Administered Medications:  .  0.9 %  sodium chloride infusion, , Intravenous, Continuous, [place], Benay Pike, MD, Last Rate: 20 mL/hr at 03/04/17 1436, 1,000 mL at 03/04/17 1436  Medications Prior to Admission  Medication Sig Dispense Refill Last Dose  . ALPRAZolam (XANAX) 0.25 MG tablet TAKE 1 TABLET BY MOUTH TWICE A DAY   03/03/2017 at Unknown time  . aspirin EC 81 MG tablet Take by mouth.   Past Week at Unknown time  . Cholecalciferol (VITAMIN D3) 2000 UNITS TABS Take 1 capsule by mouth daily.   03/03/2017 at Unknown time  . clopidogrel (PLAVIX) 75 MG tablet Take by mouth.   Past Week at Unknown time  . finasteride (PROSCAR) 5 MG tablet Take 1 tablet (5 mg total) by mouth daily. 30 tablet 11 Past Week at Unknown time  . hydrochlorothiazide (HYDRODIURIL) 12.5 MG tablet TAKE 1 TABLET (12.5 MG TOTAL) BY MOUTH ONCE DAILY.   03/03/2017 at Unknown time  . lisinopril (PRINIVIL,ZESTRIL) 10 MG tablet Take 10 mg by mouth every evening.   03/03/2017 at Unknown time  . pantoprazole (PROTONIX) 40 MG tablet Take by mouth.   03/03/2017 at Unknown time  . rosuvastatin (CRESTOR) 20 MG tablet Take 20 mg by mouth every evening.   03/03/2017 at Unknown time  . tamsulosin (FLOMAX) 0.4 MG CAPS capsule Take 2 capsules (0.8 mg total) by mouth 2 (two) times daily. 60 capsule 11 Past Week at Unknown time  . HYDROcodone-acetaminophen (NORCO/VICODIN) 5-325 MG tablet Take by mouth.   Taking  . metoprolol succinate (TOPROL-XL) 50 MG 24 hr tablet  Take 50 mg by mouth every evening. Take with or immediately following a meal.   Taking  . oxybutynin (DITROPAN) 5 MG tablet Take 1 tablet (5 mg total) by mouth every 6 (six) hours as needed for bladder spasms. (Patient not taking: Reported on 03/04/2017) 40 tablet 4 Completed Course at Unknown time  . phenazopyridine (PYRIDIUM) 200 MG tablet Take 1 tablet (200 mg total) by mouth 3 (three) times daily as needed for pain. (Patient not taking: Reported on 02/21/2016) 30 tablet 6 Completed Course at Unknown time  . senna-docusate (SENOKOT S) 8.6-50 MG per tablet Take 1 tablet by mouth 2 (two) times daily. (Patient not taking: Reported on 09/11/2016) 60 tablet 0 Completed Course at Unknown time     Allergies  Allergen Reactions  . Escitalopram Oxalate Other (See Comments)     Past Medical History:  Diagnosis Date  . Anxiety   . Arthritis   . At risk for sleep apnea    STOP-BANG= 5     SENT TO PCP 09-16-2013  . Bladder cancer ([place])   . COPD (chronic obstructive pulmonary disease) ([place])   . Coronary artery disease CARDIOLOGIST-- DR Edyth Gunnels Adonis Brook)   MI  IN 2005--  S/P  PTCA  X2 STENTS  . DJD (degenerative joint disease)   . GERD (gastroesophageal reflux disease)   . H/O hiatal hernia   . Headache   . History of adenomatous polyp of colon    S/P  PARTIAL  COLECTOMY 2006  . History of kidney stones   . History of myocardial infarction    2005--  S/P  STENTING X2  . Hyperlipidemia   . Hypertension   . Neuromuscular disorder ([place])   . S/P coronary artery stent placement    2005    Review of systems:      Physical Exam  General appearance: alert, cooperative and appears stated age Resp: clear to auscultation bilaterally Cardio: regular rate and rhythm, S1, S2 normal, no murmur, click, rub or gallop GI: soft, non-tender; bowel sounds normal; no masses,  no organomegaly Extremities: extremities normal, atraumatic, no cyanosis or edema     Planned procedures: EGD and  colonoscopy. The patient understands the nature of the planned procedure, indications, risks, alternatives and potential complications including but not limited to bleeding, infection, perforation, damage to internal organs and possible oversedation/side effects from anesthesia. The patient agrees and gives consent to proceed.  Please refer to procedure notes for findings, recommendations and patient disposition/instructions.    Teodoro K. Alice Reichert, M.D. Gastroenterology 03/04/2017  2:55 PM

## 2017-03-04 NOTE — Anesthesia Procedure Notes (Signed)
Date/Time: 03/04/2017 2:56 PM Performed by: Johnna Acosta, CRNA Pre-anesthesia Checklist: Patient identified, Emergency Drugs available, Suction available, Patient being monitored and Timeout performed Patient Re-evaluated:Patient Re-evaluated prior to induction Oxygen Delivery Method: Nasal cannula

## 2017-03-04 NOTE — Op Note (Signed)
[facility] Gastroenterology Patient Name: Jeffrey Buchanan Procedure Date: 03/04/2017 2:47 PM MRN: [id number] Account #: [id number] Date of Birth: 1955/08/17 Admit Type: Outpatient Age: 61 Room: [facility] ENDO ROOM 4 Gender: Male Note Status: Finalized Procedure:            Colonoscopy Indications:          High risk colon cancer surveillance: Personal history                        of colonic polyps Providers:            Benay Pike. Alice Reichert MD, MD Referring MD:         Ramonita Lab, MD (Referring MD) Medicines:            Propofol per Anesthesia Complications:        No immediate complications. Procedure:            Pre-Anesthesia Assessment:                       - The risks and benefits of the procedure and the                        sedation options and risks were discussed with the                        patient. All questions were answered and informed                        consent was obtained.                       - Patient identification and proposed procedure were                        verified prior to the procedure by the nurse. The                        procedure was verified in the procedure room.                       - ASA Grade Assessment: III - A patient with severe                        systemic disease.                       - After reviewing the risks and benefits, the patient                        was deemed in satisfactory condition to undergo the                        procedure.                       After obtaining informed consent, the colonoscope was                        passed under direct vision. Throughout the procedure,  the patient's blood pressure, pulse, and oxygen                        saturations were monitored continuously. The                        Colonoscope was introduced through the anus and                        advanced to the the cecum, identified by appendiceal                        orifice  and ileocecal valve. The colonoscopy was                        performed without difficulty. The patient tolerated the                        procedure well. The quality of the bowel preparation                        was fair. The ileocecal valve, appendiceal orifice, and                        rectum were photographed. The colonoscopy was performed                        without difficulty. The patient tolerated the procedure                        well. Findings:      The perianal and digital rectal examinations were normal.      A 3 mm polyp was found in the cecum. The polyp was sessile. The polyp       was removed with a jumbo cold forceps. Resection and retrieval were       complete.      A 7 mm polyp was found in the cecum. The polyp was sessile. The polyp       was removed with a hot snare. Resection and retrieval were complete.      A 6 mm polyp was found in the ascending colon. The polyp was sessile.       The polyp was removed with a hot snare. Resection and retrieval were       complete.      A 3 mm polyp was found in the transverse colon. The polyp was sessile.       The polyp was removed with a jumbo cold forceps. Resection and retrieval       were complete.      Non-bleeding internal hemorrhoids were found during retroflexion. The       hemorrhoids were Grade I (internal hemorrhoids that do not prolapse).      The exam was otherwise without abnormality.      There was evidence of a prior end-to-end colo-colonic anastomosis in the       sigmoid colon. This was patent and was characterized by healthy       appearing mucosa. The anastomosis was traversed. Impression:           - Preparation of the colon was fair.                       -  One 3 mm polyp in the cecum, removed with a jumbo                        cold forceps. Resected and retrieved.                       - One 7 mm polyp in the cecum, removed with a hot                        snare. Resected and retrieved.                        - One 6 mm polyp in the ascending colon, removed with a                        hot snare. Resected and retrieved.                       - One 3 mm polyp in the transverse colon, removed with                        a jumbo cold forceps. Resected and retrieved.                       - Non-bleeding internal hemorrhoids.                       - The examination was otherwise normal. Recommendation:       - Patient has a contact number available for                        emergencies. The signs and symptoms of potential                        delayed complications were discussed with the patient.                        Return to normal activities tomorrow. Written discharge                        instructions were provided to the patient.                       - Resume previous diet.                       - Continue present medications.                       - Await pathology results.                       - Repeat colonoscopy for surveillance based on                        pathology results.                       - Return to GI office in 3 months.                       - The  findings and recommendations were discussed with                        the patient and their spouse. Procedure Code(s):    --- Professional ---                       [phone], Colonoscopy, flexible; with removal of tumor(s),                        polyp(s), or other lesion(s) by snare technique                       45380, 53, Colonoscopy, flexible; with biopsy, single                        or multiple Diagnosis Code(s):    --- Professional ---                       D12.3, Benign neoplasm of transverse colon (hepatic                        flexure or splenic flexure)                       D12.2, Benign neoplasm of ascending colon                       D12.0, Benign neoplasm of cecum                       K64.0, First degree hemorrhoids                       Z86.010, Personal history of colonic  polyps CPT copyright 2016 American Medical Association. All rights reserved. The codes documented in this report are preliminary and upon coder review may  be revised to meet current compliance requirements. Efrain Sella MD, MD 03/04/2017 3:56:18 PM This report has been signed electronically. Number of Addenda: 0 Note Initiated On: 03/04/2017 2:47 PM Scope Withdrawal Time: 0 hours 15 minutes 40 seconds  Total Procedure Duration: 0 hours 20 minutes 6 seconds       [facility]

## 2017-03-05 ENCOUNTER — Encounter: Payer: Self-pay | Admitting: Internal Medicine

## 2017-03-05 NOTE — Anesthesia Postprocedure Evaluation (Signed)
Anesthesia Post Note  Patient: Jeffrey Buchanan  Procedure(s) Performed: COLONOSCOPY WITH PROPOFOL (N/A ) ESOPHAGOGASTRODUODENOSCOPY (EGD) WITH PROPOFOL (N/A )  Patient location during evaluation: PACU Anesthesia Type: General Level of consciousness: awake and alert and oriented Pain management: pain level controlled Vital Signs Assessment: post-procedure vital signs reviewed and stable Respiratory status: spontaneous breathing Cardiovascular status: blood pressure returned to baseline Anesthetic complications: no     Last Vitals:  Vitals:   03/04/17 1418 03/04/17 1552  BP: (!) 160/92   Pulse: 83   Resp: 20   Temp: 36.6 C (!) 36.2 C    Last Pain:  Vitals:   03/04/17 1552  TempSrc: Tympanic                 Lavanya Roa

## 2017-03-06 LAB — SURGICAL PATHOLOGY

## 2017-03-13 ENCOUNTER — Other Ambulatory Visit: Payer: BLUE CROSS/BLUE SHIELD

## 2017-05-18 ENCOUNTER — Other Ambulatory Visit: Payer: Self-pay | Admitting: Radiology

## 2017-05-18 DIAGNOSIS — N401 Enlarged prostate with lower urinary tract symptoms: Secondary | ICD-10-CM

## 2017-05-18 MED ORDER — TAMSULOSIN HCL 0.4 MG PO CAPS: 0.8000 mg | ORAL_CAPSULE | Freq: Two times a day (BID) | ORAL | 11 refills | Status: DC

## 2017-05-21 ENCOUNTER — Encounter: Payer: Self-pay | Admitting: Urology

## 2017-05-21 ENCOUNTER — Ambulatory Visit (INDEPENDENT_AMBULATORY_CARE_PROVIDER_SITE_OTHER): Payer: BLUE CROSS/BLUE SHIELD | Admitting: Urology

## 2017-05-21 VITALS — BP 126/76 | HR 90 | Ht 74.0 in | Wt 251.0 lb

## 2017-05-21 DIAGNOSIS — Z87898 Personal history of other specified conditions: Secondary | ICD-10-CM | POA: Diagnosis not present

## 2017-05-21 DIAGNOSIS — N400 Enlarged prostate without lower urinary tract symptoms: Secondary | ICD-10-CM

## 2017-05-21 DIAGNOSIS — C679 Malignant neoplasm of bladder, unspecified: Secondary | ICD-10-CM | POA: Diagnosis not present

## 2017-05-21 MED ORDER — TAMSULOSIN HCL 0.4 MG PO CAPS: 0.8000 mg | ORAL_CAPSULE | Freq: Every day | ORAL | 11 refills | Status: DC

## 2017-05-21 NOTE — Progress Notes (Signed)
05/21/2017 4:26 PM   Jeffrey Buchanan [birth year] [id number]  Referring provider: [name], MD [facility] [place], [place] [address]  Chief Complaint  Patient presents with  . Medication Refill    HPI: The patient is a 62 year old gentleman with a past metal history of low-grade TCC of the bladder presents today for follow up.  1. History of Elevated PSA PSA has run in the 5 range for many years. PSA in August 2017 was 3.9.  He was supposed to get this drawn at his last visit.  Patient had requested to be drawn at an outside lab.  I never got this result however is apparently 4.6.    2. History of bladder cancer Status post TURBT for low-grade TCC of the bladder 2014. Repeat TURBT in 2015 was unremarkable. Overdue for repeat cystoscopy.   3. BPH Currently on Flomax 0.8 mg.  Patient's symptoms are mild to moderately controlled.  He still has frequency, weak stream, and feeling of incomplete bladder emptying.  He had been on finasteride previously but stopped it when it had no effect after 6 months.  He has not previously been interested in discussing surgery.  <44mm bladder stones and 7 cm visually obstructive prostate seen on cystoscopy 11/17. Visually obstructive prostate. PVR: 0 cc        PMH: Past Medical History:  Diagnosis Date  . Anxiety   . Arthritis   . At risk for sleep apnea    STOP-BANG= 5     SENT TO PCP 09-16-2013  . Bladder cancer ([place])   . COPD (chronic obstructive pulmonary disease) ([place])   . Coronary artery disease CARDIOLOGIST-- DR Edyth Gunnels [facility])   MI  IN 2005--  S/P  PTCA  X2 STENTS  . DJD (degenerative joint disease)   . GERD (gastroesophageal reflux disease)   . H/O hiatal hernia   . Headache   . History of adenomatous polyp of colon    S/P  PARTIAL COLECTOMY 2006  . History of kidney stones   . History of myocardial infarction    2005--  S/P  STENTING X2  . Hyperlipidemia   . Hypertension   .  Neuromuscular disorder ([place])   . S/P coronary artery stent placement    2005    Surgical History: Past Surgical History:  Procedure Laterality Date  . CARDIOVASCULAR STRESS TEST  2014  dr Nehemiah Massed   normal / no ischemia  . COLONOSCOPY WITH PROPOFOL N/A 03/04/2017   Procedure: COLONOSCOPY WITH PROPOFOL;  Surgeon: Toledo, Benay Pike, MD;  Location: [facility] ENDOSCOPY;  Service: Gastroenterology;  Laterality: N/A;  . CORONARY ANGIOPLASTY WITH STENT PLACEMENT  01/ 2005   [facility]   STENT to RCA  . CYSTOSCOPY W/ RETROGRADES Bilateral 09/26/2013   Procedure: CYSTOSCOPY WITH BILATERAL RETROGRADE PYELOGRAM;  Surgeon: Sharyn Creamer, MD;  Location: [facility];  Service: Urology;  Laterality: Bilateral;  . ESOPHAGOGASTRODUODENOSCOPY (EGD) WITH PROPOFOL N/A 03/04/2017   Procedure: ESOPHAGOGASTRODUODENOSCOPY (EGD) WITH PROPOFOL;  Surgeon: Toledo, Benay Pike, MD;  Location: [facility] ENDOSCOPY;  Service: Gastroenterology;  Laterality: N/A;  . OPEN REDUCTION SHOULDER DISLOCATION  1976  . PARTIAL COLECTOMY  2006  . TRANSTHORACIC ECHOCARDIOGRAM  12-17-2011  dr Nehemiah Massed   mild lv dysfunction/  ef 45%/  moderate lvh/  mild mitral & tricuspid insufficiencey  . TRANSURETHRAL RESECTION OF BLADDER TUMOR  2014    [facility]  . TRANSURETHRAL RESECTION OF BLADDER TUMOR WITH GYRUS (TURBT-GYRUS) N/A 09/26/2013  Procedure: BLADDER BIOPSY;  Surgeon: Sharyn Creamer, MD;  Location: [facility];  Service: Urology;  Laterality: N/A;    Home Medications:  Allergies as of 05/21/2017      Reactions   Escitalopram Oxalate Other (See Comments)      Medication List        Accurate as of 05/21/17  4:26 PM. Always use your most recent med list.          ALPRAZolam 0.25 MG tablet Commonly known as:  XANAX TAKE 1 TABLET BY MOUTH TWICE A DAY   aspirin EC 81 MG tablet Take by mouth.   clopidogrel 75 MG tablet Commonly known as:  PLAVIX Take by mouth.   finasteride 5 MG tablet Commonly known as:   PROSCAR Take 1 tablet (5 mg total) by mouth daily.   hydrochlorothiazide 12.5 MG tablet Commonly known as:  HYDRODIURIL TAKE 1 TABLET (12.5 MG TOTAL) BY MOUTH ONCE DAILY.   HYDROcodone-acetaminophen 5-325 MG tablet Commonly known as:  NORCO/VICODIN Take by mouth.   lisinopril 10 MG tablet Commonly known as:  PRINIVIL,ZESTRIL Take 10 mg by mouth every evening.   metoprolol succinate 50 MG 24 hr tablet Commonly known as:  TOPROL-XL Take 50 mg by mouth every evening. Take with or immediately following a meal.   pantoprazole 40 MG tablet Commonly known as:  PROTONIX Take by mouth.   phenazopyridine 200 MG tablet Commonly known as:  PYRIDIUM Take 1 tablet (200 mg total) by mouth 3 (three) times daily as needed for pain.   rosuvastatin 20 MG tablet Commonly known as:  CRESTOR Take 20 mg by mouth every evening.   senna-docusate 8.6-50 MG tablet Commonly known as:  SENOKOT S Take 1 tablet by mouth 2 (two) times daily.   tamsulosin 0.4 MG Caps capsule Commonly known as:  FLOMAX Take 2 capsules (0.8 mg total) by mouth 2 (two) times daily.   tamsulosin 0.4 MG Caps capsule Commonly known as:  FLOMAX Take 2 capsules (0.8 mg total) by mouth daily.   Vitamin D3 2000 units Tabs Take 1 capsule by mouth daily.       Allergies:  Allergies  Allergen Reactions  . Escitalopram Oxalate Other (See Comments)    Family History: Family History  Problem Relation Age of Onset  . Prostate cancer Brother   . Kidney disease Neg Hx     Social History:  reports that he has been smoking cigarettes.  He has a 32.00 pack-year smoking history. he has never used smokeless tobacco. He reports that he does not drink alcohol or use drugs.  ROS: UROLOGY Frequent Urination?: Yes Hard to postpone urination?: Yes Burning/pain with urination?: No Get up at night to urinate?: Yes Leakage of urine?: Yes Urine stream starts and stops?: Yes Trouble starting stream?: Yes Do you have to strain to  urinate?: Yes Blood in urine?: No Urinary tract infection?: No Sexually transmitted disease?: No Injury to kidneys or bladder?: No Painful intercourse?: No Weak stream?: Yes Erection problems?: No Penile pain?: No  Gastrointestinal Nausea?: No Vomiting?: No Indigestion/heartburn?: No Diarrhea?: No Constipation?: No  Constitutional Fever: No Night sweats?: No Weight loss?: No Fatigue?: No  Skin Skin rash/lesions?: No Itching?: No  Eyes Blurred vision?: No Double vision?: No  Ears/Nose/Throat Sore throat?: No Sinus problems?: No  Hematologic/Lymphatic Swollen glands?: No Easy bruising?: No  Cardiovascular Leg swelling?: No Chest pain?: No  Respiratory Cough?: No Shortness of breath?: No  Endocrine Excessive thirst?: No  Musculoskeletal Back pain?:  No Joint pain?: No  Neurological Headaches?: No Dizziness?: No  Psychologic Depression?: No Anxiety?: No  Physical Exam: BP 126/76   Pulse 90   Ht 6\' 2"  (1.88 m)   Wt 251 lb (113.9 kg)   BMI 32.23 kg/m   Constitutional:  Alert and oriented, No acute distress. HEENT: [place] AT, moist mucus membranes.  Trachea midline, no masses. Cardiovascular: No clubbing, cyanosis, or edema. Respiratory: Normal respiratory effort, no increased work of breathing. GI: Abdomen is soft, nontender, nondistended, no abdominal masses GU: No CVA tenderness.  Skin: No rashes, bruises or suspicious lesions. Lymph: No cervical or inguinal adenopathy. Neurologic: Grossly intact, no focal deficits, moving all 4 extremities. Psychiatric: Normal mood and affect.  Laboratory Data: Lab Results  Component Value Date   WBC 8.6 03/07/2013   HGB 17.0 09/26/2013   HCT 50.0 09/26/2013   MCV 89 03/07/2013   PLT 184 03/07/2013    Lab Results  Component Value Date   CREATININE 0.93 03/07/2013    No results found for: PSA  No results found for: TESTOSTERONE  No results found for: HGBA1C  Urinalysis    Component Value  Date/Time   COLORURINE Yellow 03/07/2013 1658   APPEARANCEUR Clear 03/07/2013 1658   LABSPEC 1.025 03/07/2013 1658   PHURINE 5.0 03/07/2013 1658   GLUCOSEU Negative 03/07/2013 1658   HGBUR Negative 03/07/2013 1658   BILIRUBINUR Negative 03/07/2013 1658   KETONESUR Negative 03/07/2013 1658   PROTEINUR Negative 03/07/2013 1658   NITRITE Negative 03/07/2013 1658   LEUKOCYTESUR Negative 03/07/2013 1658    Assessment & Plan:    1. History of elevated PSA Due for PSA today.  PSA may rise his finasteride has been stopped.  He is again requesting to have the lab drawn at work.  A prescription was given.  2. History of bladder cancer Overdue for repeat cystoscopy.  Strongly encourage the patient to follow-up in the near future for procedure visit for cystoscopy.   3. BPH Continue flomax to 0.8 mg. Patient not interested in surgical correction at this time.  However he did have more questions today regarding a transurethral resection of prostate.  4. Bladder stones - very small -2/2 to BPH/imcomplete emptying. Will reassess size at next cystoscopy    Return for cysto.  Nickie Retort, MD  [facility] [address], [place], [place] [address] [phone]

## 2017-05-25 ENCOUNTER — Other Ambulatory Visit: Payer: Self-pay

## 2018-06-11 ENCOUNTER — Ambulatory Visit: Payer: BLUE CROSS/BLUE SHIELD | Admitting: Urology

## 2019-03-29 ENCOUNTER — Ambulatory Visit: Payer: BLUE CROSS/BLUE SHIELD | Admitting: Urology

## 2019-04-12 ENCOUNTER — Telehealth: Payer: Self-pay | Admitting: Urology

## 2019-04-12 ENCOUNTER — Ambulatory Visit (INDEPENDENT_AMBULATORY_CARE_PROVIDER_SITE_OTHER): Payer: BC Managed Care – PPO | Admitting: Urology

## 2019-04-12 ENCOUNTER — Other Ambulatory Visit: Payer: Self-pay

## 2019-04-12 ENCOUNTER — Encounter: Payer: Self-pay | Admitting: Urology

## 2019-04-12 VITALS — BP 136/71 | HR 62 | Ht 74.0 in | Wt 263.0 lb

## 2019-04-12 DIAGNOSIS — Z789 Other specified health status: Secondary | ICD-10-CM | POA: Diagnosis not present

## 2019-04-12 DIAGNOSIS — R3121 Asymptomatic microscopic hematuria: Secondary | ICD-10-CM | POA: Diagnosis not present

## 2019-04-12 DIAGNOSIS — N3943 Post-void dribbling: Secondary | ICD-10-CM

## 2019-04-12 DIAGNOSIS — Z87898 Personal history of other specified conditions: Secondary | ICD-10-CM | POA: Diagnosis not present

## 2019-04-12 DIAGNOSIS — N401 Enlarged prostate with lower urinary tract symptoms: Secondary | ICD-10-CM

## 2019-04-12 DIAGNOSIS — R1031 Right lower quadrant pain: Secondary | ICD-10-CM

## 2019-04-12 LAB — URINALYSIS, COMPLETE
Bilirubin, UA: NEGATIVE
Glucose, UA: NEGATIVE
Ketones, UA: NEGATIVE
Leukocytes,UA: NEGATIVE
Nitrite, UA: NEGATIVE
Protein,UA: NEGATIVE
Specific Gravity, UA: >1.03 — ABNORMAL HIGH (ref 1.005–1.030)
Urobilinogen, Ur: 0.2 mg/dL (ref 0.2–1.0)
pH, UA: 5 (ref 5.0–7.5)

## 2019-04-12 LAB — MICROSCOPIC EXAMINATION
Bacteria, UA: NONE SEEN
Epithelial Cells (non renal): NONE SEEN /hpf (ref 0–10)

## 2019-04-12 LAB — BLADDER SCAN AMB NON-IMAGING

## 2019-04-12 MED ORDER — FINASTERIDE 5 MG PO TABS: 5.0000 mg | ORAL_TABLET | Freq: Every day | ORAL | 3 refills | Status: DC

## 2019-04-12 NOTE — Progress Notes (Signed)
04/12/2019 5:18 PM   Jeffrey Buchanan 03/13/1956 [id number]  Referring provider: [name], MD [facility] [place],  [place] [address]  Chief Complaint  Patient presents with  . Benign Prostatic Hypertrophy    HPI: 64 year old male who is booked as a new patient today who in fact is known to our practice, previously followed by Dr. Baruch Gouty and seen in 05/2017 for personal history of elevated PSA, history of bladder cancer and BPH.  History of elevated PSA: PSA typically does run in the 3-5 range.  Most recent result available to me on 2/19 was 3.4.  No previous prostate biopsies.  He had a lab drawn at [facility] which was 4.8 on 01/26/19.  Personal history of bladder cancer: Status post TURBT for low-grade TCC cc in 2014.  Pathology report reads urothelial carcinoma, papillary, low-grade.  There is no indication whether or not there is any invasion.  He returned to the operating room a year later which was negative.  His last cystoscopy was in the office on November 2017.  He is overdue for surveillance.  BPH: Previously on Flomax which he continues.  Cystoscopic prostatic hypertrophy, described a 7 cm prostate with obstructing appearance and very small bladder stones.  Reports that his symptoms are recently well controlled and started after his bladder biopsy in 2015 which required a catheter (?  Concern for perforation).  He is not interested in surgical intervention.  He is primarily concerned today about dribbling after completing urination.  He feels like even if he wipes, he will have some drops come out.  This gets trapped under his foreskin which is also very bothersome to him.  He did used to have this problem.  He is able to fully retract his foreskin but it takes increasing amount of hygiene.  Microscopic hematuria:  Microscopic hematuria both at [facility] on routine labs 02/2019 as well as today.  Denies gross hematuria.  Groin  pain: He called back into the room and said his primary compliant is ctually groin present for the 1.5 years.  Exacerbated with activiey, releived with rest.  Happending now 2-3 x per week.  Radiates to testicle right testicle primarily.  IPSS    Row Name 04/12/19 1300         International Prostate Symptom Score   How often have you had the sensation of not emptying your bladder?  Less than 1 in 5     How often have you had to urinate less than every two hours?  About half the time     How often have you found you stopped and started again several times when you urinated?  About half the time     How often have you found it difficult to postpone urination?  Less than half the time     How often have you had a weak urinary stream?  Less than 1 in 5 times     How often have you had to strain to start urination?  Not at All     How many times did you typically get up at night to urinate?  3 Times     Total IPSS Score  13       Quality of Life due to urinary symptoms   If you were to spend the rest of your life with your urinary condition just the way it is now how would you feel about that?  Mostly Disatisfied  Score:  1-7 Mild 8-19 Moderate 20-35 Severe      PMH: Past Medical History:  Diagnosis Date  . Anxiety   . Arthritis   . At risk for sleep apnea    STOP-BANG= 5     SENT TO PCP 09-16-2013  . Bladder cancer ([place])   . COPD (chronic obstructive pulmonary disease) ([place])   . Coronary artery disease CARDIOLOGIST-- DR Edyth Gunnels [facility])   MI  IN 2005--  S/P  PTCA  X2 STENTS  . DJD (degenerative joint disease)   . GERD (gastroesophageal reflux disease)   . H/O hiatal hernia   . Headache   . History of adenomatous polyp of colon    S/P  PARTIAL COLECTOMY 2006  . History of kidney stones   . History of myocardial infarction    2005--  S/P  STENTING X2  . Hyperlipidemia   . Hypertension   . Neuromuscular disorder ([place])   . S/P coronary artery stent placement      2005    Surgical History: Past Surgical History:  Procedure Laterality Date  . CARDIOVASCULAR STRESS TEST  2014  dr Nehemiah Massed   normal / no ischemia  . COLONOSCOPY WITH PROPOFOL N/A 03/04/2017   Procedure: COLONOSCOPY WITH PROPOFOL;  Surgeon: Toledo, Benay Pike, MD;  Location: [facility] ENDOSCOPY;  Service: Gastroenterology;  Laterality: N/A;  . CORONARY ANGIOPLASTY WITH STENT PLACEMENT  01/ 2005   [facility]   STENT to RCA  . CYSTOSCOPY W/ RETROGRADES Bilateral 09/26/2013   Procedure: CYSTOSCOPY WITH BILATERAL RETROGRADE PYELOGRAM;  Surgeon: Sharyn Creamer, MD;  Location: [facility];  Service: Urology;  Laterality: Bilateral;  . ESOPHAGOGASTRODUODENOSCOPY (EGD) WITH PROPOFOL N/A 03/04/2017   Procedure: ESOPHAGOGASTRODUODENOSCOPY (EGD) WITH PROPOFOL;  Surgeon: Toledo, Benay Pike, MD;  Location: [facility] ENDOSCOPY;  Service: Gastroenterology;  Laterality: N/A;  . OPEN REDUCTION SHOULDER DISLOCATION  1976  . PARTIAL COLECTOMY  2006  . TRANSTHORACIC ECHOCARDIOGRAM  12-17-2011  dr Nehemiah Massed   mild lv dysfunction/  ef 45%/  moderate lvh/  mild mitral & tricuspid insufficiencey  . TRANSURETHRAL RESECTION OF BLADDER TUMOR  2014    [facility]  . TRANSURETHRAL RESECTION OF BLADDER TUMOR WITH GYRUS (TURBT-GYRUS) N/A 09/26/2013   Procedure: BLADDER BIOPSY;  Surgeon: Sharyn Creamer, MD;  Location: [facility];  Service: Urology;  Laterality: N/A;    Home Medications:  Allergies as of 04/12/2019      Reactions   Escitalopram Oxalate Other (See Comments)      Medication List       Accurate as of April 12, 2019  5:18 PM. If you have any questions, ask your nurse or doctor.        STOP taking these medications   lisinopril 10 MG tablet Commonly known as: ZESTRIL Stopped by: Hollice Espy, MD   senna-docusate 8.6-50 MG tablet Commonly known as: Senokot S Stopped by: Hollice Espy, MD     TAKE these medications   ALPRAZolam 0.25 MG tablet Commonly known as: XANAX TAKE 1  TABLET BY MOUTH TWICE A DAY   aspirin EC 81 MG tablet Take by mouth.   clopidogrel 75 MG tablet Commonly known as: PLAVIX Take by mouth.   finasteride 5 MG tablet Commonly known as: PROSCAR Take 1 tablet (5 mg total) by mouth daily.   hydrochlorothiazide 12.5 MG tablet Commonly known as: HYDRODIURIL TAKE 1 TABLET (12.5 MG TOTAL) BY MOUTH ONCE DAILY.   HYDROcodone-acetaminophen 5-325 MG tablet Commonly known as: NORCO/VICODIN Take by mouth.  metoprolol succinate 50 MG 24 hr tablet Commonly known as: TOPROL-XL Take 50 mg by mouth every evening. Take with or immediately following a meal.   pantoprazole 40 MG tablet Commonly known as: PROTONIX Take by mouth.   phenazopyridine 200 MG tablet Commonly known as: Pyridium Take 1 tablet (200 mg total) by mouth 3 (three) times daily as needed for pain.   rosuvastatin 20 MG tablet Commonly known as: CRESTOR Take 20 mg by mouth every evening.   tamsulosin 0.4 MG Caps capsule Commonly known as: FLOMAX Take 2 capsules (0.8 mg total) by mouth 2 (two) times daily. What changed: Another medication with the same name was removed. Continue taking this medication, and follow the directions you see here. Changed by: Hollice Espy, MD   Vitamin D3 50 MCG (2000 UT) Tabs Take 1 capsule by mouth daily.       Allergies:  Allergies  Allergen Reactions  . Escitalopram Oxalate Other (See Comments)    Family History: Family History  Problem Relation Age of Onset  . Prostate cancer Brother   . Kidney disease Neg Hx     Social History:  reports that he has been smoking cigarettes. He has a 32.00 pack-year smoking history. He has never used smokeless tobacco. He reports that he does not drink alcohol or use drugs.  ROS: UROLOGY Frequent Urination?: No Hard to postpone urination?: Yes Burning/pain with urination?: No Get up at night to urinate?: Yes Leakage of urine?: Yes Urine stream starts and stops?: Yes Trouble starting  stream?: Yes Do you have to strain to urinate?: No Blood in urine?: No Urinary tract infection?: No Sexually transmitted disease?: No Injury to kidneys or bladder?: No Painful intercourse?: No Weak stream?: No Erection problems?: No Penile pain?: No  Gastrointestinal Nausea?: No Vomiting?: No Indigestion/heartburn?: No Diarrhea?: No Constipation?: No  Constitutional Fever: No Night sweats?: No Weight loss?: No Fatigue?: No  Skin Skin rash/lesions?: No Itching?: No  Eyes Blurred vision?: No Double vision?: No  Ears/Nose/Throat Sore throat?: No Sinus problems?: No  Hematologic/Lymphatic Swollen glands?: No Easy bruising?: No  Cardiovascular Leg swelling?: No Chest pain?: No  Respiratory Cough?: No Shortness of breath?: No  Endocrine Excessive thirst?: No  Musculoskeletal Back pain?: No Joint pain?: No  Neurological Headaches?: No Dizziness?: No  Psychologic Depression?: No Anxiety?: No  Physical Exam: BP 136/71   Pulse 62   Ht 6\' 2"  (1.88 m)   Wt 263 lb (119.3 kg)   BMI 33.77 kg/m   Constitutional:  Alert and oriented, No acute distress. HEENT: [place] AT, moist mucus membranes.  Trachea midline, no masses. Cardiovascular: No clubbing, cyanosis, or edema. Respiratory: Normal respiratory effort, no increased work of breathing. GI: Abdomen is soft, nontender, nondistended, no abdominal masses, obese GU: Uncircumcised phallus with easily retractable foreskin. .  Rectal: Normal sphincter tone.  40 cc prostate, nontender no nodules, not able to palpate base secondary to habitus. Skin: No rashes, bruises or suspicious lesions. Neurologic: Grossly intact, no focal deficits, moving all 4 extremities. Psychiatric: Normal mood and affect.  Laboratory Data: Labs as above  Urinalysis UA today with + RBC, see epic  Pertinent Imaging: No recent crossectional imaging  Assessment & Plan:    1. Asymptomatic microscopic hematuria We discussed the  AUA recommendations for hematuria evaluation.  Given his personal history of bladder cancer as well as lack of cystoscopy since 2017, this my primary concern today I strongly recommend he pursue cystoscopic evaluation to rule out recurrence of his bladder cancer  as soon as possible.  We can also assess his prostate at this time.  Other possible or likely etiologies include personal history of prostamegaly in the setting of aspirin and Plavix.  We discussed that if there is no finding cystoscopically to explain his microscopic hematuria, would recommend upper tract imaging.  We will discuss this further at next follow-up.  He is agreeable this plan.  - Urinalysis, Complete - Bladder Scan (Post Void Residual) in office  2. History of elevated PSA Personal history of elevated fluctuating PSA.  Most recent rise into the fours but has been high 6 in the past.  I recommended that he have this repeated in the near future for confirmation.  We discussed reasons for possible PSA fluctuation.  He is given an order to have these drawn at [facility] prior to his follow-up with [place].  Rectal exam reassuring. - PSA; Future  3. Benign prostatic hyperplasia with post-void dribbling Currently on Flomax with moderate urinary symptoms  We discussed various treatment options for prostamegaly/BPH which include optimization of medical therapy versus surgical intervention.  We briefly discussed various surgical techniques.  In this point time, he is most interested in optimizing pharmacotherapy.  He was on finasteride in the remote past but is willing to try this again.  We discussed the risks and he understands that it takes several months to reach its maximal effect.  4. Uncircumcised male He is bothered by his foreskin and hygiene issues related to urinary trapping  We discussed the option of circumcision today including the risk and benefits which could be done in the office or in the operating room.  He  will think about [place] to let us know if he like to pursue this in the future.  5. Right groin pain At the end of our encounter today he called back into the room after physical exam and mention right groin pain.  Based on his history, it sounds like he may have a hernia.  We will plan to examine him physically for this at the time of cystoscopy.  If there is a palpable hernia, will send to general surgery if not, this may be best imaged if and when we pursue upper tract imaging in the form of CT urogram.  Return for cystoscopy next availible (ASAP) and for inguinal exam.  Hollice Espy, MD  [place] [address], [place], [place] [address] [phone]  I spent 45 min with this patient of which greater than 50% was spent in counseling and coordination of care with the patient.

## 2019-04-14 ENCOUNTER — Telehealth: Payer: Self-pay | Admitting: Urology

## 2019-04-14 ENCOUNTER — Other Ambulatory Visit: Payer: Self-pay

## 2019-04-14 NOTE — Telephone Encounter (Signed)
Please take this patient and let them know that I got his repeat PSA.  It is back down to 3.4 which is excellent.  This is stable from last year.  We will see him at follow-up as scheduled.  Hollice Espy, MD

## 2019-04-14 NOTE — Telephone Encounter (Signed)
Left patient a VM with details, asked to call back with any questions.

## 2019-05-03 ENCOUNTER — Other Ambulatory Visit: Payer: Self-pay

## 2019-05-03 ENCOUNTER — Ambulatory Visit (INDEPENDENT_AMBULATORY_CARE_PROVIDER_SITE_OTHER): Payer: BC Managed Care – PPO | Admitting: Urology

## 2019-05-03 ENCOUNTER — Encounter: Payer: Self-pay | Admitting: Urology

## 2019-05-03 VITALS — BP 117/73 | HR 116 | Ht 74.0 in | Wt 263.0 lb

## 2019-05-03 DIAGNOSIS — R3121 Asymptomatic microscopic hematuria: Secondary | ICD-10-CM | POA: Diagnosis not present

## 2019-05-03 DIAGNOSIS — N210 Calculus in bladder: Secondary | ICD-10-CM

## 2019-05-03 DIAGNOSIS — N400 Enlarged prostate without lower urinary tract symptoms: Secondary | ICD-10-CM

## 2019-05-03 DIAGNOSIS — Z8551 Personal history of malignant neoplasm of bladder: Secondary | ICD-10-CM

## 2019-05-03 DIAGNOSIS — N50812 Left testicular pain: Secondary | ICD-10-CM

## 2019-05-03 DIAGNOSIS — R3129 Other microscopic hematuria: Secondary | ICD-10-CM

## 2019-05-03 NOTE — Progress Notes (Signed)
   05/06/19  CC:  Chief Complaint  Patient presents with  . Cysto    HPI: 64 year old male with a personal history of bladder cancer overdue for surveillance cystoscopy who presents today primarily for this.  He also has microscopic hematuria.  Please see previous notes for details.  In addition to the above, he continues have issues with right groin and testicular pain.  Blood pressure 117/73, pulse (!) 116, height 6\' 2"  (1.88 m), weight 263 lb (119.3 kg). NED. A&Ox3.   No respiratory distress   Abd soft, NT, ND Normal phallus with bilateral descended testicles.  Left epididymal cyst appreciated on exam.  No testicular tenderness masses or tumors.  No evidence of inguinal hernia when examined in supine position with Valsalva.  Cystoscopy Procedure Note  Patient identification was confirmed, informed consent was obtained, and patient was prepped using Betadine solution.  Lidocaine jelly was administered per urethral meatus.     Pre-Procedure: - Inspection reveals a normal caliber ureteral meatus.  Procedure: The flexible cystoscope was introduced without difficulty - No urethral strictures/lesions are present. - Enlarged prostate trilobar coaptation, friable prostatic mucosa with a 7 + centimeter prostatic length - Elevated bladder neck - Bilateral ureteral orifices identified - Bladder mucosa  reveals no ulcers, tumors, or lesions -At least 10 small subcentimeter bladder stones layering around bladder neck -Mild trabeculation  Retroflexion shows no discrete median lobe, stones layering around elevated bladder neck   Post-Procedure: - Patient tolerated the procedure well  Assessment/ Plan:  1. Asymptomatic microscopic hematuria Likely secondary to bladder stones, see below - Urinalysis, Complete  2. Right testicular pain Anatomically normal on exam today, unable to elicit the pain  Plan for scrotal ultrasound for reassurance - US SCROTUM W/DOPPLER; Future  3.  Bladder stones Significant prostamegaly with bladder stones, consistent with chronic bladder outlet obstruction  He is only mildly symptomatic bothered by post void dribbling.  Despite this, we discussed that stones are pathopneumonic for outlet obstruction and can be indicative of further issues down the road.  Given the conversation for outlet procedure is somewhat lengthy and the patient has a innumerable question, we will plan to follow-up to discuss this further.  Most likely, will recommend cystolitholapaxy with holmium laser enucleation given the degree of his prostamegaly, suspect greater than 100 g prostate.  4. Enlarged prostate As above  5. History of bladder cancer No evidence of recurrent bladder cancer today  Given the presence of microscopic hematuria, would also recommend a bilateral retrograde pyelogram if and when he elects to pursue outlet surgery for completeness.   Return for next availible to discuss surgery.  Hollice Espy, MD

## 2019-05-03 NOTE — Patient Instructions (Signed)

## 2019-05-04 LAB — MICROSCOPIC EXAMINATION: Bacteria, UA: NONE SEEN

## 2019-05-04 LAB — URINALYSIS, COMPLETE
Bilirubin, UA: NEGATIVE
Glucose, UA: NEGATIVE
Leukocytes,UA: NEGATIVE
Nitrite, UA: NEGATIVE
Specific Gravity, UA: >1.03 — ABNORMAL HIGH (ref 1.005–1.030)
Urobilinogen, Ur: 0.2 mg/dL (ref 0.2–1.0)
pH, UA: 5 (ref 5.0–7.5)

## 2019-05-16 ENCOUNTER — Ambulatory Visit
Admission: RE | Admit: 2019-05-16 | Discharge: 2019-05-16 | Disposition: A | Discharge status: Home / Self Care | Payer: BC Managed Care – PPO | Source: Ambulatory Visit | Attending: Urology | Admitting: Urology

## 2019-05-16 ENCOUNTER — Other Ambulatory Visit: Payer: Self-pay

## 2019-05-16 DIAGNOSIS — N50812 Left testicular pain: Secondary | ICD-10-CM | POA: Insufficient documentation

## 2019-05-17 ENCOUNTER — Encounter: Payer: Self-pay | Admitting: Urology

## 2019-05-17 ENCOUNTER — Ambulatory Visit: Payer: BC Managed Care – PPO | Admitting: Urology

## 2019-05-17 ENCOUNTER — Other Ambulatory Visit: Payer: Self-pay | Admitting: Radiology

## 2019-05-17 VITALS — BP 117/72 | HR 99 | Ht 74.0 in | Wt 263.0 lb

## 2019-05-17 DIAGNOSIS — R3121 Asymptomatic microscopic hematuria: Secondary | ICD-10-CM

## 2019-05-17 DIAGNOSIS — Z87898 Personal history of other specified conditions: Secondary | ICD-10-CM | POA: Diagnosis not present

## 2019-05-17 DIAGNOSIS — R1031 Right lower quadrant pain: Secondary | ICD-10-CM

## 2019-05-17 DIAGNOSIS — N400 Enlarged prostate without lower urinary tract symptoms: Secondary | ICD-10-CM

## 2019-05-17 DIAGNOSIS — N210 Calculus in bladder: Secondary | ICD-10-CM

## 2019-05-17 NOTE — H&P (View-Only) (Signed)
05/17/2019 12:56 PM   Jeffrey Buchanan 07/29/1955 [id number]  Referring provider: [name], MD [facility] [place],  [place] [address]  Chief Complaint  Patient presents with  . Discuss surgery    HPI: 64 year old male with a history of massive prostamegaly, recurrent bladder stones, microscopic hematuria and remote history of bladder cancer returns today to discuss surgical management options for his bladder stones and bladder outlet obstruction.  He also has been struggling with intermittent right groin pain which can be severe at times although infrequent.  Last several hours.  He has had evaluation with physical exam which was fairly unremarkable as well as follows up today with a scrotal ultrasound which shows bilateral spermatoceles but no clear etiology for his intermittent groin pain.  He is not particular bothered by this at this time.  He underwent cystoscopic evaluation for his history of bladder cancer microscopic hematuria found to have multiple recurrent bladder calculi as well as significant prostamegaly.  He is a bladder stone treated in the past as well without electing to pursue an outlet surgery.  He is chronically on Flomax.  His primary complaint is post void dribbling.   PMH: Past Medical History:  Diagnosis Date  . Anxiety   . Arthritis   . At risk for sleep apnea    STOP-BANG= 5     SENT TO PCP 09-16-2013  . Bladder cancer ([place])   . COPD (chronic obstructive pulmonary disease) ([place])   . Coronary artery disease CARDIOLOGIST-- DR Edyth Gunnels [place] [place])   MI  IN 2005--  S/P  PTCA  X2 STENTS  . DJD (degenerative joint disease)   . GERD (gastroesophageal reflux disease)   . H/O hiatal hernia   . Headache   . History of adenomatous polyp of colon    S/P  PARTIAL COLECTOMY 2006  . History of kidney stones   . History of myocardial infarction    2005--  S/P  STENTING X2  . Hyperlipidemia   . Hypertension   .  Neuromuscular disorder ([place])   . S/P coronary artery stent placement    2005    Surgical History: Past Surgical History:  Procedure Laterality Date  . CARDIOVASCULAR STRESS TEST  2014  dr Nehemiah Massed   normal / no ischemia  . COLONOSCOPY WITH PROPOFOL N/A 03/04/2017   Procedure: COLONOSCOPY WITH PROPOFOL;  Surgeon: Toledo, Benay Pike, MD;  Location: [facility];  Service: Gastroenterology;  Laterality: N/A;  . CORONARY ANGIOPLASTY WITH STENT PLACEMENT  01/ 2005   [facility]   STENT to RCA  . CYSTOSCOPY W/ RETROGRADES Bilateral 09/26/2013   Procedure: CYSTOSCOPY WITH BILATERAL RETROGRADE PYELOGRAM;  Surgeon: Sharyn Creamer, MD;  Location: [facility];  Service: Urology;  Laterality: Bilateral;  . ESOPHAGOGASTRODUODENOSCOPY (EGD) WITH PROPOFOL N/A 03/04/2017   Procedure: ESOPHAGOGASTRODUODENOSCOPY (EGD) WITH PROPOFOL;  Surgeon: Toledo, Benay Pike, MD;  Location: [facility];  Service: Gastroenterology;  Laterality: N/A;  . OPEN REDUCTION SHOULDER DISLOCATION  1976  . PARTIAL COLECTOMY  2006  . TRANSTHORACIC ECHOCARDIOGRAM  12-17-2011  dr Nehemiah Massed   mild lv dysfunction/  ef 45%/  moderate lvh/  mild mitral & tricuspid insufficiencey  . TRANSURETHRAL RESECTION OF BLADDER TUMOR  2014    [facility]  . TRANSURETHRAL RESECTION OF BLADDER TUMOR WITH GYRUS (TURBT-GYRUS) N/A 09/26/2013   Procedure: BLADDER BIOPSY;  Surgeon: Sharyn Creamer, MD;  Location: [facility];  Service: Urology;  Laterality: N/A;    Home Medications:  Allergies as of 05/17/2019      Reactions   Escitalopram Oxalate Other (See Comments)      Medication List       Accurate as of May 17, 2019 12:56 PM. If you have any questions, ask your nurse or doctor.        ALPRAZolam 0.25 MG tablet Commonly known as: XANAX TAKE 1 TABLET BY MOUTH TWICE A DAY   aspirin EC 81 MG tablet Take by mouth.   clopidogrel 75 MG tablet Commonly known as: PLAVIX Take by mouth.   finasteride 5 MG tablet  Commonly known as: PROSCAR Take 1 tablet (5 mg total) by mouth daily.   hydrochlorothiazide 12.5 MG tablet Commonly known as: HYDRODIURIL TAKE 1 TABLET (12.5 MG TOTAL) BY MOUTH ONCE DAILY.   HYDROcodone-acetaminophen 5-325 MG tablet Commonly known as: NORCO/VICODIN Take by mouth.   metoprolol succinate 50 MG 24 hr tablet Commonly known as: TOPROL-XL Take 50 mg by mouth every evening. Take with or immediately following a meal.   rosuvastatin 20 MG tablet Commonly known as: CRESTOR Take 20 mg by mouth every evening.   tamsulosin 0.4 MG Caps capsule Commonly known as: FLOMAX Take 2 capsules (0.8 mg total) by mouth 2 (two) times daily.   Vitamin D3 50 MCG (2000 UT) Tabs Take 1 capsule by mouth daily.       Allergies:  Allergies  Allergen Reactions  . Escitalopram Oxalate Other (See Comments)    Family History: Family History  Problem Relation Age of Onset  . Prostate cancer Brother   . Kidney disease Neg Hx     Social History:  reports that he has been smoking cigarettes. He has a 32.00 pack-year smoking history. He has never used smokeless tobacco. He reports that he does not drink alcohol or use drugs.  ROS: UROLOGY Frequent Urination?: Yes Hard to postpone urination?: Yes Burning/pain with urination?: No Get up at night to urinate?: Yes Leakage of urine?: No Urine stream starts and stops?: Yes Trouble starting stream?: Yes Do you have to strain to urinate?: No Blood in urine?: No Urinary tract infection?: No Sexually transmitted disease?: No Injury to kidneys or bladder?: No Painful intercourse?: No Weak stream?: No Erection problems?: No Penile pain?: No  Gastrointestinal Nausea?: No Vomiting?: No Indigestion/heartburn?: No Diarrhea?: No Constipation?: No  Constitutional Fever: No Night sweats?: No Weight loss?: No Fatigue?: No  Skin Skin rash/lesions?: No Itching?: No  Eyes Blurred vision?: No Double vision?: No  Ears/Nose/Throat  Sore throat?: No Sinus problems?: No  Hematologic/Lymphatic Swollen glands?: No Easy bruising?: No  Cardiovascular Leg swelling?: No Chest pain?: No  Respiratory Cough?: No Shortness of breath?: No  Endocrine Excessive thirst?: No  Musculoskeletal Back pain?: No Joint pain?: No  Neurological Headaches?: No Dizziness?: No  Psychologic Depression?: No Anxiety?: No  Physical Exam: BP 117/72   Pulse 99   Ht 6\' 2"  (1.88 m)   Wt 263 lb (119.3 kg)   BMI 33.77 kg/m   Constitutional:  Alert and oriented, No acute distress. HEENT: [place] AT, moist mucus membranes.  Trachea midline, no masses. Cardiovascular: No clubbing, cyanosis, or edema. Respiratory: Normal respiratory effort, no increased work of breathing. Skin: No rashes, bruises or suspicious lesions. Neurologic: Grossly intact, no focal deficits, moving all 4 extremities. Psychiatric: Normal mood and affect.  Laboratory Data: Lab Results  Component Value Date   WBC 8.6 03/07/2013   HGB 17.0 09/26/2013   HCT 50.0 09/26/2013   MCV 89 03/07/2013   PLT  184 03/07/2013    Lab Results  Component Value Date   CREATININE 0.93 03/07/2013   Pertinent Imaging: Study Result  CLINICAL DATA:  Right testicular pain  EXAM: SCROTAL ULTRASOUND  DOPPLER ULTRASOUND OF THE TESTICLES  TECHNIQUE: Complete ultrasound examination of the testicles, epididymis, and other scrotal structures was performed. Color and spectral Doppler ultrasound were also utilized to evaluate blood flow to the testicles.  COMPARISON:  None.  FINDINGS: Right testicle  Measurements: 3.9 x 2.4 x 3.4 cm. No mass or microlithiasis visualized. Prominent rete testis  Left testicle  Measurements: 3.6 x 3.2 x 2.7 cm. No mass or microlithiasis visualized. Prominent rete testis  Right epididymis: Multiple cystic areas. The largest measures 5 cm with internal debris.  Left epididymis: Multiple cystic areas, the largest measuring  4.3 cm with internal debris.  Hydrocele:  None visualized.  Varicocele:  None visualized.  Pulsed Doppler interrogation of both testes demonstrates normal low resistance arterial and venous waveforms bilaterally.  IMPRESSION: No focal testicular abnormality. No evidence of torsion.  Bilateral large cystic areas within the epididymi, likely spermatoceles.   Electronically Signed   By: Rolm Baptise M.D.   On: 05/16/2019 21:50    Scrotal ultrasound was personally reviewed, agree with radiologic to rotation.  Assessment & Plan:    1. Bladder stones Multiple bladder calculi, likely result of chronic outlet obstruction, recurrent  I recommended cystolitholapaxy as these will likely to continue to be an issue, will likely grow and sizing cause issues with obstruction and infection.  We discussed the pathophysiology of production of bladder stones which she has made in the past.  This is related to chronic bladder outlet obstruction.  I have very strongly recommended a concomitant outlet procedure in the form of holmium laser enucleation of the prostate given the very large prostate size to reduce his outlet and reduce further risks of recurrent bladder stones.  He is tentatively agreeable to this plan as below but is very worried about having a catheter postoperatively.  Discussed the risk of the procedure in detail.  We  also discussed holmium laser enucleation of the prostate.   We discussed the common postoperative course following holep including need for overnight Foley catheter, temporary worsening of irritative voiding symptoms, and occasional stress incontinence which typically lasts up to 6 months but can persist. We discussed retrograde ejaculation and damage to surrounding structures including the urinary sphincter. Other uncommon complications including hematuria and urinary tract infection.   2. Enlarged prostate Outlet surgery as below above  3. History of  elevated PSA PSA is normalized, recommend annual repeat  4. Asymptomatic microscopic hematuria Likely secondary to bladder stones  We will proceed with bilateral retrograde pyelogram intraoperatively for completeness  5. Right groin pain Etiology remains unclear  Scott ultrasound today is reassuring, no evidence of scrotal pathology.  This may be referred pain from his bladder.  Alternatively, he could have an occult hernia and he was offered CT scan which he declined at this time.  We will continue to reassess.   Hollice Espy, MD  [facility] [address], [place], [place] [address] [phone]

## 2019-05-17 NOTE — Progress Notes (Signed)
05/17/2019 12:56 PM   Jeffrey Buchanan Feb 15, 1956 [id number]  Referring provider: [name], MD [facility] [place],  [place] [address]  Chief Complaint  Patient presents with  . Discuss surgery    HPI: 64 year old male with a history of massive prostamegaly, recurrent bladder stones, microscopic hematuria and remote history of bladder cancer returns today to discuss surgical management options for his bladder stones and bladder outlet obstruction.  He also has been struggling with intermittent right groin pain which can be severe at times although infrequent.  Last several hours.  He has had evaluation with physical exam which was fairly unremarkable as well as follows up today with a scrotal ultrasound which shows bilateral spermatoceles but no clear etiology for his intermittent groin pain.  He is not particular bothered by this at this time.  He underwent cystoscopic evaluation for his history of bladder cancer microscopic hematuria found to have multiple recurrent bladder calculi as well as significant prostamegaly.  He is a bladder stone treated in the past as well without electing to pursue an outlet surgery.  He is chronically on Flomax.  His primary complaint is post void dribbling.   PMH: Past Medical History:  Diagnosis Date  . Anxiety   . Arthritis   . At risk for sleep apnea    STOP-BANG= 5     SENT TO PCP 09-16-2013  . Bladder cancer ([place])   . COPD (chronic obstructive pulmonary disease) ([place])   . Coronary artery disease CARDIOLOGIST-- DR Edyth Gunnels [facility] [place])   MI  IN 2005--  S/P  PTCA  X2 STENTS  . DJD (degenerative joint disease)   . GERD (gastroesophageal reflux disease)   . H/O hiatal hernia   . Headache   . History of adenomatous polyp of colon    S/P  PARTIAL COLECTOMY 2006  . History of kidney stones   . History of myocardial infarction    2005--  S/P  STENTING X2  . Hyperlipidemia   . Hypertension   .  Neuromuscular disorder ([place])   . S/P coronary artery stent placement    2005    Surgical History: Past Surgical History:  Procedure Laterality Date  . CARDIOVASCULAR STRESS TEST  2014  dr Nehemiah Massed   normal / no ischemia  . COLONOSCOPY WITH PROPOFOL N/A 03/04/2017   Procedure: COLONOSCOPY WITH PROPOFOL;  Surgeon: Toledo, Benay Pike, MD;  Location: [facility];  Service: Gastroenterology;  Laterality: N/A;  . CORONARY ANGIOPLASTY WITH STENT PLACEMENT  01/ 2005   [facility]   STENT to RCA  . CYSTOSCOPY W/ RETROGRADES Bilateral 09/26/2013   Procedure: CYSTOSCOPY WITH BILATERAL RETROGRADE PYELOGRAM;  Surgeon: Sharyn Creamer, MD;  Location: [facility];  Service: Urology;  Laterality: Bilateral;  . ESOPHAGOGASTRODUODENOSCOPY (EGD) WITH PROPOFOL N/A 03/04/2017   Procedure: ESOPHAGOGASTRODUODENOSCOPY (EGD) WITH PROPOFOL;  Surgeon: Toledo, Benay Pike, MD;  Location: [facility];  Service: Gastroenterology;  Laterality: N/A;  . OPEN REDUCTION SHOULDER DISLOCATION  1976  . PARTIAL COLECTOMY  2006  . TRANSTHORACIC ECHOCARDIOGRAM  12-17-2011  dr Nehemiah Massed   mild lv dysfunction/  ef 45%/  moderate lvh/  mild mitral & tricuspid insufficiencey  . TRANSURETHRAL RESECTION OF BLADDER TUMOR  2014    [facility]  . TRANSURETHRAL RESECTION OF BLADDER TUMOR WITH GYRUS (TURBT-GYRUS) N/A 09/26/2013   Procedure: BLADDER BIOPSY;  Surgeon: Sharyn Creamer, MD;  Location: [facility];  Service: Urology;  Laterality: N/A;    Home Medications:  Allergies as of 05/17/2019      Reactions   Escitalopram Oxalate Other (See Comments)      Medication List       Accurate as of May 17, 2019 12:56 PM. If you have any questions, ask your nurse or doctor.        ALPRAZolam 0.25 MG tablet Commonly known as: XANAX TAKE 1 TABLET BY MOUTH TWICE A DAY   aspirin EC 81 MG tablet Take by mouth.   clopidogrel 75 MG tablet Commonly known as: PLAVIX Take by mouth.   finasteride 5 MG tablet  Commonly known as: PROSCAR Take 1 tablet (5 mg total) by mouth daily.   hydrochlorothiazide 12.5 MG tablet Commonly known as: HYDRODIURIL TAKE 1 TABLET (12.5 MG TOTAL) BY MOUTH ONCE DAILY.   HYDROcodone-acetaminophen 5-325 MG tablet Commonly known as: NORCO/VICODIN Take by mouth.   metoprolol succinate 50 MG 24 hr tablet Commonly known as: TOPROL-XL Take 50 mg by mouth every evening. Take with or immediately following a meal.   rosuvastatin 20 MG tablet Commonly known as: CRESTOR Take 20 mg by mouth every evening.   tamsulosin 0.4 MG Caps capsule Commonly known as: FLOMAX Take 2 capsules (0.8 mg total) by mouth 2 (two) times daily.   Vitamin D3 50 MCG (2000 UT) Tabs Take 1 capsule by mouth daily.       Allergies:  Allergies  Allergen Reactions  . Escitalopram Oxalate Other (See Comments)    Family History: Family History  Problem Relation Age of Onset  . Prostate cancer Brother   . Kidney disease Neg Hx     Social History:  reports that he has been smoking cigarettes. He has a 32.00 pack-year smoking history. He has never used smokeless tobacco. He reports that he does not drink alcohol or use drugs.  ROS: UROLOGY Frequent Urination?: Yes Hard to postpone urination?: Yes Burning/pain with urination?: No Get up at night to urinate?: Yes Leakage of urine?: No Urine stream starts and stops?: Yes Trouble starting stream?: Yes Do you have to strain to urinate?: No Blood in urine?: No Urinary tract infection?: No Sexually transmitted disease?: No Injury to kidneys or bladder?: No Painful intercourse?: No Weak stream?: No Erection problems?: No Penile pain?: No  Gastrointestinal Nausea?: No Vomiting?: No Indigestion/heartburn?: No Diarrhea?: No Constipation?: No  Constitutional Fever: No Night sweats?: No Weight loss?: No Fatigue?: No  Skin Skin rash/lesions?: No Itching?: No  Eyes Blurred vision?: No Double vision?: No  Ears/Nose/Throat  Sore throat?: No Sinus problems?: No  Hematologic/Lymphatic Swollen glands?: No Easy bruising?: No  Cardiovascular Leg swelling?: No Chest pain?: No  Respiratory Cough?: No Shortness of breath?: No  Endocrine Excessive thirst?: No  Musculoskeletal Back pain?: No Joint pain?: No  Neurological Headaches?: No Dizziness?: No  Psychologic Depression?: No Anxiety?: No  Physical Exam: BP 117/72   Pulse 99   Ht 6\' 2"  (1.88 m)   Wt 263 lb (119.3 kg)   BMI 33.77 kg/m   Constitutional:  Alert and oriented, No acute distress. HEENT: [place] AT, moist mucus membranes.  Trachea midline, no masses. Cardiovascular: No clubbing, cyanosis, or edema. Respiratory: Normal respiratory effort, no increased work of breathing. Skin: No rashes, bruises or suspicious lesions. Neurologic: Grossly intact, no focal deficits, moving all 4 extremities. Psychiatric: Normal mood and affect.  Laboratory Data: Lab Results  Component Value Date   WBC 8.6 03/07/2013   HGB 17.0 09/26/2013   HCT 50.0 09/26/2013   MCV 89 03/07/2013   PLT  184 03/07/2013    Lab Results  Component Value Date   CREATININE 0.93 03/07/2013   Pertinent Imaging: Study Result  CLINICAL DATA:  Right testicular pain  EXAM: SCROTAL ULTRASOUND  DOPPLER ULTRASOUND OF THE TESTICLES  TECHNIQUE: Complete ultrasound examination of the testicles, epididymis, and other scrotal structures was performed. Color and spectral Doppler ultrasound were also utilized to evaluate blood flow to the testicles.  COMPARISON:  None.  FINDINGS: Right testicle  Measurements: 3.9 x 2.4 x 3.4 cm. No mass or microlithiasis visualized. Prominent rete testis  Left testicle  Measurements: 3.6 x 3.2 x 2.7 cm. No mass or microlithiasis visualized. Prominent rete testis  Right epididymis: Multiple cystic areas. The largest measures 5 cm with internal debris.  Left epididymis: Multiple cystic areas, the largest measuring  4.3 cm with internal debris.  Hydrocele:  None visualized.  Varicocele:  None visualized.  Pulsed Doppler interrogation of both testes demonstrates normal low resistance arterial and venous waveforms bilaterally.  IMPRESSION: No focal testicular abnormality. No evidence of torsion.  Bilateral large cystic areas within the epididymi, likely spermatoceles.   Electronically Signed   By: Rolm Baptise M.D.   On: 05/16/2019 21:50    Scrotal ultrasound was personally reviewed, agree with radiologic to rotation.  Assessment & Plan:    1. Bladder stones Multiple bladder calculi, likely result of chronic outlet obstruction, recurrent  I recommended cystolitholapaxy as these will likely to continue to be an issue, will likely grow and sizing cause issues with obstruction and infection.  We discussed the pathophysiology of production of bladder stones which she has made in the past.  This is related to chronic bladder outlet obstruction.  I have very strongly recommended a concomitant outlet procedure in the form of holmium laser enucleation of the prostate given the very large prostate size to reduce his outlet and reduce further risks of recurrent bladder stones.  He is tentatively agreeable to this plan as below but is very worried about having a catheter postoperatively.  Discussed the risk of the procedure in detail.  We  also discussed holmium laser enucleation of the prostate.   We discussed the common postoperative course following holep including need for overnight Foley catheter, temporary worsening of irritative voiding symptoms, and occasional stress incontinence which typically lasts up to 6 months but can persist. We discussed retrograde ejaculation and damage to surrounding structures including the urinary sphincter. Other uncommon complications including hematuria and urinary tract infection.   2. Enlarged prostate Outlet surgery as below above  3. History of  elevated PSA PSA is normalized, recommend annual repeat  4. Asymptomatic microscopic hematuria Likely secondary to bladder stones  We will proceed with bilateral retrograde pyelogram intraoperatively for completeness  5. Right groin pain Etiology remains unclear  Scott ultrasound today is reassuring, no evidence of scrotal pathology.  This may be referred pain from his bladder.  Alternatively, he could have an occult hernia and he was offered CT scan which he declined at this time.  We will continue to reassess.   Hollice Espy, MD  [facility] [address] [place], [place] [address] [phone]

## 2019-05-30 ENCOUNTER — Encounter: Payer: Self-pay | Admitting: Urology

## 2019-06-03 ENCOUNTER — Telehealth: Payer: Self-pay | Admitting: Radiology

## 2019-06-03 ENCOUNTER — Other Ambulatory Visit: Payer: Self-pay | Admitting: Family Medicine

## 2019-06-03 DIAGNOSIS — N210 Calculus in bladder: Secondary | ICD-10-CM

## 2019-06-03 NOTE — Telephone Encounter (Signed)
Patient would like to proceed with cystolitholapaxy but would like to cancel HOLEP.

## 2019-06-06 ENCOUNTER — Other Ambulatory Visit: Payer: Self-pay

## 2019-06-06 ENCOUNTER — Other Ambulatory Visit: Payer: BC Managed Care – PPO

## 2019-06-06 DIAGNOSIS — N210 Calculus in bladder: Secondary | ICD-10-CM

## 2019-06-07 LAB — URINALYSIS, COMPLETE
Bilirubin, UA: NEGATIVE
Glucose, UA: NEGATIVE
Ketones, UA: NEGATIVE
Leukocytes,UA: NEGATIVE
Nitrite, UA: NEGATIVE
Protein,UA: NEGATIVE
Specific Gravity, UA: 1.025 (ref 1.005–1.030)
Urobilinogen, Ur: 0.2 mg/dL (ref 0.2–1.0)
pH, UA: 5 (ref 5.0–7.5)

## 2019-06-07 LAB — MICROSCOPIC EXAMINATION
Bacteria, UA: NONE SEEN
RBC, Urine: >30 /hpf — AB (ref 0–2)

## 2019-06-08 ENCOUNTER — Other Ambulatory Visit: Payer: Self-pay

## 2019-06-08 ENCOUNTER — Other Ambulatory Visit
Admission: RE | Admit: 2019-06-08 | Discharge: 2019-06-08 | Disposition: A | Discharge status: Home / Self Care | Payer: BC Managed Care – PPO | Source: Ambulatory Visit | Attending: Urology | Admitting: Urology

## 2019-06-08 ENCOUNTER — Other Ambulatory Visit: Payer: Self-pay | Admitting: Radiology

## 2019-06-08 HISTORY — DX: Acute myocardial infarction, unspecified: I21.9

## 2019-06-08 NOTE — Patient Instructions (Signed)
Your procedure is scheduled on: Monday 06/13/19  Report to [facility]. To find out your arrival time please call [phone] between 1PM - 3PM on Friday 06/10/19.   Remember: Instructions that are not followed completely may result in serious medical risk, up to and including death, or upon the discretion of your surgeon and anesthesiologist your surgery may need to be rescheduled.      _X__ 1. Do not eat food after midnight the night before your procedure.                 No gum chewing or hard candies. You may drink clear liquids up to 2 hours                 before you are scheduled to arrive for your surgery- DO NOT drink clear                 liquids within 2 hours of the start of your surgery.                 Clear Liquids include:  water, apple juice without pulp, clear carbohydrate                 drink such as Clearfast or Gatorade, Black Coffee or Tea (Do not add                 anything to coffee or tea).    __X__2.  On the morning of surgery brush your teeth with toothpaste and water, you may rinse your mouth with mouthwash if you wish.  Do not swallow any toothpaste or mouthwash.       _X__ 3.  No Alcohol for 24 hours before or after surgery.     _X__ 4.  Do Not Smoke or use e-cigarettes For 24 Hours Prior to Your Surgery.                 Do not use any chewable tobacco products for at least 6 hours prior to                 surgery.   __X__5.  Notify your doctor if there is any change in your medical condition      (cold, fever, infections).      Do not wear jewelry, make-up, hairpins, clips or nail polish. Do not wear lotions, powders, or perfumes.  Do not shave 48 hours prior to surgery. Men may shave face and neck. Do not bring valuables to the hospital.     [facility] is not responsible for any belongings or valuables.   Contacts, dentures/partials or body piercings may not be worn into surgery. Bring  a case for your contacts, glasses or hearing aids, a denture cup will be supplied.     Patients discharged the day of surgery will not be allowed to drive home.     __X__ Take these medicines the morning of surgery with A SIP OF WATER:     1. ALPRAZolam (XANAX)   2. dexlansoprazole (DEXILANT)  3. HYDROcodone-acetaminophen (NORCO/VICODIN)  If needed    __X__ Stop Blood Thinners: Aspirin & Plavix until after your procedure. You reported stopping both of these medications on 06/06/19.   __X__ Stop Anti-inflammatories 7 days before surgery such as Advil, Ibuprofen, Motrin, BC or Goodies Powder, Naprosyn, Naproxen, Aleve, Aspirin, Meloxicam. May take Tylenol if needed for pain or discomfort.    __X__  Don't  start taking any new herbal supplements or vitamins until after your procedure.

## 2019-06-09 ENCOUNTER — Other Ambulatory Visit
Admission: RE | Admit: 2019-06-09 | Discharge: 2019-06-09 | Disposition: A | Discharge status: Home / Self Care | Payer: BC Managed Care – PPO | Source: Ambulatory Visit | Attending: Urology | Admitting: Urology

## 2019-06-09 DIAGNOSIS — Z01812 Encounter for preprocedural laboratory examination: Secondary | ICD-10-CM | POA: Diagnosis present

## 2019-06-09 DIAGNOSIS — Z20822 Contact with and (suspected) exposure to covid-19: Secondary | ICD-10-CM | POA: Diagnosis not present

## 2019-06-09 LAB — BASIC METABOLIC PANEL
Anion gap: 7 (ref 5–15)
BUN: 19 mg/dL (ref 8–23)
CO2: 32 mmol/L (ref 22–32)
Calcium: 9.3 mg/dL (ref 8.9–10.3)
Chloride: 101 mmol/L (ref 98–111)
Creatinine, Ser: 1.08 mg/dL (ref 0.61–1.24)
GFR calc Af Amer: >60 mL/min (ref >60)
GFR calc non Af Amer: >60 mL/min (ref >60)
Glucose, Bld: 89 mg/dL (ref 70–99)
Potassium: 4 mmol/L (ref 3.5–5.1)
Sodium: 140 mmol/L (ref 135–145)

## 2019-06-09 LAB — CBC
HCT: 50 % (ref 39.0–52.0)
Hemoglobin: 16.3 g/dL (ref 13.0–17.0)
MCH: 29.9 pg (ref 26.0–34.0)
MCHC: 32.6 g/dL (ref 30.0–36.0)
MCV: 91.6 fL (ref 80.0–100.0)
Platelets: 180 10*3/uL (ref 150–400)
RBC: 5.46 MIL/uL (ref 4.22–5.81)
RDW: 13.2 % (ref 11.5–15.5)
WBC: 8.5 10*3/uL (ref 4.0–10.5)
nRBC: 0 % (ref 0.0–0.2)

## 2019-06-09 LAB — SARS CORONAVIRUS 2 (TAT 6-24 HRS): SARS Coronavirus 2: NEGATIVE

## 2019-06-09 NOTE — Telephone Encounter (Signed)
error 

## 2019-06-10 ENCOUNTER — Telehealth: Payer: Self-pay | Admitting: Radiology

## 2019-06-10 DIAGNOSIS — N210 Calculus in bladder: Secondary | ICD-10-CM

## 2019-06-10 LAB — CULTURE, URINE COMPREHENSIVE

## 2019-06-10 MED ORDER — CIPROFLOXACIN HCL 500 MG PO TABS: 500.0000 mg | ORAL_TABLET | Freq: Two times a day (BID) | ORAL | 0 refills | Status: DC

## 2019-06-10 NOTE — Telephone Encounter (Signed)
Notified patient of positive urine culture and script sent to pharmacy. Questions answered patient verbalizes understanding.

## 2019-06-10 NOTE — Telephone Encounter (Signed)
-----   Message from Hollice Espy, MD sent at 06/10/2019  3:38 PM EST ----- Cipro 500 bid starting TODAY x 5 days

## 2019-06-13 ENCOUNTER — Ambulatory Visit: Payer: BC Managed Care – PPO

## 2019-06-13 ENCOUNTER — Other Ambulatory Visit: Payer: Self-pay

## 2019-06-13 ENCOUNTER — Ambulatory Visit: Payer: BC Managed Care – PPO | Admitting: Anesthesiology

## 2019-06-13 ENCOUNTER — Encounter
Admission: RE | Disposition: A | Discharge status: Home / Self Care | Payer: Self-pay | Source: Home / Self Care | Attending: Urology

## 2019-06-13 ENCOUNTER — Ambulatory Visit
Admission: RE | Admit: 2019-06-13 | Discharge: 2019-06-13 | Disposition: A | Discharge status: Home / Self Care | Payer: BC Managed Care – PPO | Attending: Urology | Admitting: Urology

## 2019-06-13 ENCOUNTER — Encounter: Payer: Self-pay | Admitting: Urology

## 2019-06-13 DIAGNOSIS — N401 Enlarged prostate with lower urinary tract symptoms: Secondary | ICD-10-CM | POA: Insufficient documentation

## 2019-06-13 DIAGNOSIS — N400 Enlarged prostate without lower urinary tract symptoms: Secondary | ICD-10-CM

## 2019-06-13 DIAGNOSIS — J449 Chronic obstructive pulmonary disease, unspecified: Secondary | ICD-10-CM | POA: Insufficient documentation

## 2019-06-13 DIAGNOSIS — Z888 Allergy status to other drugs, medicaments and biological substances status: Secondary | ICD-10-CM | POA: Diagnosis not present

## 2019-06-13 DIAGNOSIS — F17200 Nicotine dependence, unspecified, uncomplicated: Secondary | ICD-10-CM | POA: Insufficient documentation

## 2019-06-13 DIAGNOSIS — E785 Hyperlipidemia, unspecified: Secondary | ICD-10-CM | POA: Diagnosis not present

## 2019-06-13 DIAGNOSIS — N210 Calculus in bladder: Secondary | ICD-10-CM | POA: Insufficient documentation

## 2019-06-13 DIAGNOSIS — M1990 Unspecified osteoarthritis, unspecified site: Secondary | ICD-10-CM | POA: Diagnosis not present

## 2019-06-13 DIAGNOSIS — I2510 Atherosclerotic heart disease of native coronary artery without angina pectoris: Secondary | ICD-10-CM | POA: Insufficient documentation

## 2019-06-13 DIAGNOSIS — I10 Essential (primary) hypertension: Secondary | ICD-10-CM | POA: Insufficient documentation

## 2019-06-13 DIAGNOSIS — Z7902 Long term (current) use of antithrombotics/antiplatelets: Secondary | ICD-10-CM | POA: Insufficient documentation

## 2019-06-13 DIAGNOSIS — Z79899 Other long term (current) drug therapy: Secondary | ICD-10-CM | POA: Diagnosis not present

## 2019-06-13 DIAGNOSIS — Z7982 Long term (current) use of aspirin: Secondary | ICD-10-CM | POA: Diagnosis not present

## 2019-06-13 DIAGNOSIS — Z87442 Personal history of urinary calculi: Secondary | ICD-10-CM | POA: Diagnosis not present

## 2019-06-13 DIAGNOSIS — R3121 Asymptomatic microscopic hematuria: Secondary | ICD-10-CM

## 2019-06-13 DIAGNOSIS — Z8551 Personal history of malignant neoplasm of bladder: Secondary | ICD-10-CM | POA: Insufficient documentation

## 2019-06-13 DIAGNOSIS — N138 Other obstructive and reflux uropathy: Secondary | ICD-10-CM | POA: Insufficient documentation

## 2019-06-13 DIAGNOSIS — G709 Myoneural disorder, unspecified: Secondary | ICD-10-CM | POA: Insufficient documentation

## 2019-06-13 DIAGNOSIS — Z955 Presence of coronary angioplasty implant and graft: Secondary | ICD-10-CM | POA: Diagnosis not present

## 2019-06-13 DIAGNOSIS — R319 Hematuria, unspecified: Secondary | ICD-10-CM | POA: Diagnosis not present

## 2019-06-13 DIAGNOSIS — I252 Old myocardial infarction: Secondary | ICD-10-CM | POA: Insufficient documentation

## 2019-06-13 HISTORY — PX: STONE EXTRACTION WITH BASKET: SHX5318

## 2019-06-13 HISTORY — PX: CYSTOSCOPY WITH LITHOLAPAXY: SHX1425

## 2019-06-13 SURGERY — CYSTOSCOPY, WITH BLADDER CALCULUS LITHOLAPAXY
Anesthesia: General | Site: Bladder

## 2019-06-13 MED ORDER — FENTANYL CITRATE (PF) 100 MCG/2ML IJ SOLN
INTRAMUSCULAR | Status: AC
  Filled 2019-06-13: qty 2

## 2019-06-13 MED ORDER — ONDANSETRON HCL 4 MG/2ML IJ SOLN
INTRAMUSCULAR | Status: AC
  Filled 2019-06-13: qty 2

## 2019-06-13 MED ORDER — LIDOCAINE HCL (PF) 2 % IJ SOLN
INTRAMUSCULAR | Status: AC
  Filled 2019-06-13: qty 5

## 2019-06-13 MED ORDER — MIDAZOLAM HCL 2 MG/2ML IJ SOLN
INTRAMUSCULAR | Status: DC | PRN
  Administered 2019-06-13: 2 mg via INTRAVENOUS

## 2019-06-13 MED ORDER — IOHEXOL 180 MG/ML  SOLN
INTRAMUSCULAR | Status: DC | PRN
  Administered 2019-06-13: 20 mL

## 2019-06-13 MED ORDER — SUCCINYLCHOLINE CHLORIDE 20 MG/ML IJ SOLN
INTRAMUSCULAR | Status: DC | PRN
  Administered 2019-06-13: 100 mg via INTRAVENOUS

## 2019-06-13 MED ORDER — CEFAZOLIN SODIUM-DEXTROSE 2-4 GM/100ML-% IV SOLN
INTRAVENOUS | Status: AC
  Filled 2019-06-13: qty 100

## 2019-06-13 MED ORDER — PHENYLEPHRINE HCL (PRESSORS) 10 MG/ML IV SOLN
INTRAVENOUS | Status: DC | PRN
  Administered 2019-06-13 (×2): 100 ug via INTRAVENOUS

## 2019-06-13 MED ORDER — CEFAZOLIN SODIUM-DEXTROSE 2-4 GM/100ML-% IV SOLN
2.0000 g | INTRAVENOUS | Status: AC
  Administered 2019-06-13: 2 g via INTRAVENOUS

## 2019-06-13 MED ORDER — DEXAMETHASONE SODIUM PHOSPHATE 10 MG/ML IJ SOLN
INTRAMUSCULAR | Status: DC | PRN
  Administered 2019-06-13: 10 mg via INTRAVENOUS

## 2019-06-13 MED ORDER — FENTANYL CITRATE (PF) 100 MCG/2ML IJ SOLN
INTRAMUSCULAR | Status: DC | PRN
  Administered 2019-06-13 (×2): 50 ug via INTRAVENOUS

## 2019-06-13 MED ORDER — DEXAMETHASONE SODIUM PHOSPHATE 10 MG/ML IJ SOLN
INTRAMUSCULAR | Status: AC
  Filled 2019-06-13: qty 1

## 2019-06-13 MED ORDER — PHENYLEPHRINE HCL (PRESSORS) 10 MG/ML IV SOLN
INTRAVENOUS | Status: AC
  Filled 2019-06-13: qty 1

## 2019-06-13 MED ORDER — MIDAZOLAM HCL 2 MG/2ML IJ SOLN
INTRAMUSCULAR | Status: AC
  Filled 2019-06-13: qty 2

## 2019-06-13 MED ORDER — ROCURONIUM BROMIDE 50 MG/5ML IV SOLN
INTRAVENOUS | Status: AC
  Filled 2019-06-13: qty 1

## 2019-06-13 MED ORDER — PROPOFOL 10 MG/ML IV BOLUS
INTRAVENOUS | Status: DC | PRN
  Administered 2019-06-13: 200 mg via INTRAVENOUS

## 2019-06-13 MED ORDER — PROPOFOL 10 MG/ML IV BOLUS
INTRAVENOUS | Status: AC
  Filled 2019-06-13: qty 20

## 2019-06-13 MED ORDER — ONDANSETRON HCL 4 MG/2ML IJ SOLN
INTRAMUSCULAR | Status: DC | PRN
  Administered 2019-06-13: 4 mg via INTRAVENOUS

## 2019-06-13 MED ORDER — LACTATED RINGERS IV SOLN: INTRAVENOUS | Status: DC

## 2019-06-13 MED ORDER — SUCCINYLCHOLINE CHLORIDE 20 MG/ML IJ SOLN
INTRAMUSCULAR | Status: AC
  Filled 2019-06-13: qty 1

## 2019-06-13 MED ORDER — LIDOCAINE HCL (CARDIAC) PF 100 MG/5ML IV SOSY
PREFILLED_SYRINGE | INTRAVENOUS | Status: DC | PRN
  Administered 2019-06-13: 100 mg via INTRAVENOUS

## 2019-06-13 SURGICAL SUPPLY — 21 items
BAG DRAIN CYSTO-URO LG1000N (MISCELLANEOUS) ×4 IMPLANT
BASKET ZERO TIP 1.9FR (BASKET) ×4 IMPLANT
BRUSH SCRUB EZ  4% CHG (MISCELLANEOUS) ×2
BRUSH SCRUB EZ 4% CHG (MISCELLANEOUS) ×2 IMPLANT
CATH URETL 5X70 OPEN END (CATHETERS) ×4 IMPLANT
DRAPE UTILITY 15X26 TOWEL STRL (DRAPES) ×4 IMPLANT
GLOVE BIO SURGEON STRL SZ 6.5 (GLOVE) ×3 IMPLANT
GLOVE BIO SURGEONS STRL SZ 6.5 (GLOVE) ×1
GOWN STRL REUS W/ TWL LRG LVL3 (GOWN DISPOSABLE) ×4 IMPLANT
GOWN STRL REUS W/TWL LRG LVL3 (GOWN DISPOSABLE) ×4
GUIDEWIRE STR DUAL SENSOR (WIRE) ×4 IMPLANT
KIT TURNOVER CYSTO (KITS) ×4 IMPLANT
PACK CYSTO AR (MISCELLANEOUS) ×4 IMPLANT
SET CYSTO W/LG BORE CLAMP LF (SET/KITS/TRAYS/PACK) IMPLANT
SET IRRIG Y TYPE TUR BLADDER L (SET/KITS/TRAYS/PACK) ×4 IMPLANT
SOL .9 NS 3000ML IRR  AL (IV SOLUTION) ×2
SOL .9 NS 3000ML IRR UROMATIC (IV SOLUTION) ×2 IMPLANT
SURGILUBE 2OZ TUBE FLIPTOP (MISCELLANEOUS) ×4 IMPLANT
SYRINGE IRR TOOMEY STRL 70CC (SYRINGE) ×4 IMPLANT
WATER STERILE IRR 1000ML POUR (IV SOLUTION) ×4 IMPLANT
WATER STERILE IRR 3000ML UROMA (IV SOLUTION) ×8 IMPLANT

## 2019-06-13 NOTE — Telephone Encounter (Signed)
Called patient to discuss symptoms. Advised per Dr. Erlene Quan symptoms post surgery are not uncommon with leaking. Advised to call if symptoms worsen. Voiced understanding.

## 2019-06-13 NOTE — Interval H&P Note (Signed)
History and Physical Interval Note:  06/13/2019 7:19 AM  Jeffrey Buchanan  has presented today for surgery, with the diagnosis of bladder stones, enlarged prostate, hematuria.  The various methods of treatment have been discussed with the patient and family. After consideration of risks, benefits and other options for treatment, the patient has consented to  Procedure(s): CYSTOSCOPY WITH LITHOLAPAXY (N/A) CYSTOSCOPY WITH RETROGRADE PYELOGRAM (Bilateral) as a surgical intervention.  The patient's history has been reviewed, patient examined, no change in status, stable for surgery.  I have reviewed the patient's chart and labs.  Questions were answered to the patient's satisfaction.     RRR CTAB  He has elected NOT to pursue outlet procedure in form of HoLEP, will treat bladder stones only.    Hollice Espy

## 2019-06-13 NOTE — Anesthesia Preprocedure Evaluation (Signed)
Anesthesia Evaluation  Patient identified by MRN, date of birth, ID band Patient awake    Reviewed: Allergy & Precautions, NPO status , Patient's Chart, lab work & pertinent test results  History of Anesthesia Complications Negative for: history of anesthetic complications  Airway Mallampati: III  TM Distance: >3 FB Neck ROM: Full   Comment: Large neck Dental no notable dental hx. (+) Teeth Intact, Dental Advisory Given   Pulmonary neg pulmonary ROS, neg sleep apnea, neg COPD, Current Smoker and Patient abstained from smoking.,    Pulmonary exam normal breath sounds clear to auscultation       Cardiovascular Exercise Tolerance: Good METShypertension, + CAD, + Past MI and + Cardiac Stents  negative cardio ROS  (-) dysrhythmias  Rhythm:Regular Rate:Normal - Systolic murmurs Last taken ASA/Plavix on feb 28th Sees cardiologist every 6 months. Cardiac function has been stable per patient, no physical activity limitation   Neuro/Psych  Headaches, PSYCHIATRIC DISORDERS Anxiety negative neurological ROS  negative psych ROS   GI/Hepatic GERD  Controlled,(+)     (-) substance abuse  ,   Endo/Other  neg diabetes  Renal/GU negative Renal ROS     Musculoskeletal   Abdominal   Peds  Hematology   Anesthesia Other Findings Past Medical History: No date: Anxiety No date: Arthritis No date: At risk for sleep apnea     Comment:  STOP-BANG= 5     SENT TO PCP 09-16-2013 No date: Bladder cancer ([place]) CARDIOLOGIST-- DR Edyth Gunnels Adonis Brook): Coronary artery disease     Comment:  MI  IN 2005--  S/P  PTCA  X2 STENTS No date: DJD (degenerative joint disease) No date: GERD (gastroesophageal reflux disease) No date: Headache No date: History of adenomatous polyp of colon     Comment:  S/P  PARTIAL COLECTOMY 2006 No date: History of kidney stones No date: History of myocardial infarction     Comment:  2005--  S/P  STENTING X2 No  date: Hyperlipidemia No date: Hypertension No date: Myocardial infarction ([place]) No date: Neuromuscular disorder ([place]) No date: S/P coronary artery stent placement     Comment:  2005  Reproductive/Obstetrics                             Anesthesia Physical Anesthesia Plan  ASA: III  Anesthesia Plan: General   Post-op Pain Management:    Induction: Intravenous  PONV Risk Score and Plan: 3 and Ondansetron, Dexamethasone and Midazolam  Airway Management Planned: Oral ETT and LMA  Additional Equipment: None  Intra-op Plan:   Post-operative Plan: Extubation in OR  Informed Consent: I have reviewed the patients History and Physical, chart, labs and discussed the procedure including the risks, benefits and alternatives for the proposed anesthesia with the patient or authorized representative who has indicated his/her understanding and acceptance.     Dental advisory given  Plan Discussed with: CRNA and Surgeon  Anesthesia Plan Comments: (Discussed risks of anesthesia with patient, including PONV, sore throat, lip/dental damage. Rare risks discussed as well, such as cardiorespiratory and neurological sequelae. Patient understands.)        Anesthesia Quick Evaluation

## 2019-06-13 NOTE — Anesthesia Postprocedure Evaluation (Signed)
Anesthesia Post Note  Patient: Jeffrey Buchanan  Procedure(s) Performed: CYSTOSCOPY (N/A Bladder) STONE EXTRACTION WITH BASKET (N/A Bladder)  Patient location during evaluation: PACU Anesthesia Type: General Level of consciousness: awake and alert Pain management: pain level controlled Vital Signs Assessment: post-procedure vital signs reviewed and stable Respiratory status: spontaneous breathing, nonlabored ventilation, respiratory function stable and patient connected to nasal cannula oxygen Cardiovascular status: blood pressure returned to baseline and stable Postop Assessment: no apparent nausea or vomiting Anesthetic complications: no     Last Vitals:  Vitals:   06/13/19 0907 06/13/19 0915  BP:  128/68  Pulse: 79 80  Resp: 19 18  Temp: 36.6 C 36.7 C  SpO2: 93% 94%    Last Pain:  Vitals:   06/13/19 0915  TempSrc: Temporal  PainSc: 3                  Arita Miss

## 2019-06-13 NOTE — Transfer of Care (Signed)
Immediate Anesthesia Transfer of Care Note  Patient: Jeffrey Buchanan  Procedure(s) Performed: CYSTOSCOPY (N/A Bladder) STONE EXTRACTION WITH BASKET (N/A Bladder)  Patient Location: PACU  Anesthesia Type:General  Level of Consciousness: drowsy and patient cooperative  Airway & Oxygen Therapy: Patient Spontanous Breathing and Patient connected to face mask oxygen  Post-op Assessment: Report given to RN and Post -op Vital signs reviewed and stable  Post vital signs: Reviewed and stable  Last Vitals:  Vitals Value Taken Time  BP 134/72 06/13/19 0830  Temp    Pulse 92 06/13/19 0831  Resp 14 06/13/19 0831  SpO2 95 % 06/13/19 0831  Vitals shown include unvalidated device data.  Last Pain:  Vitals:   06/13/19 0613  TempSrc: Temporal  PainSc: 2       Patients Stated Pain Goal: 0 ([id number])  Complications: No apparent anesthesia complications

## 2019-06-13 NOTE — Discharge Instructions (Addendum)
Cystoscopy Cystoscopy is a procedure that is used to help diagnose and sometimes treat conditions that affect the lower urinary tract. The lower urinary tract includes the bladder and the urethra. The urethra is the tube that drains urine from the bladder. Cystoscopy is done using a thin, tube-shaped instrument with a light and camera at the end (cystoscope). The cystoscope may be hard or flexible, depending on the goal of the procedure. The cystoscope is inserted through the urethra, into the bladder. Cystoscopy may be recommended if you have:  Urinary tract infections that keep coming back.  Blood in the urine (hematuria).  An inability to control when you urinate (urinary incontinence) or an overactive bladder.  Unusual cells found in a urine sample.  A blockage in the urethra, such as a urinary stone.  Painful urination.  An abnormality in the bladder found during an intravenous pyelogram (IVP) or CT scan. Cystoscopy may also be done to remove a sample of tissue to be examined under a microscope (biopsy). Tell a health care provider about:  Any allergies you have.  All medicines you are taking, including vitamins, herbs, eye drops, creams, and over-the-counter medicines.  Any problems you or family members have had with anesthetic medicines.  Any blood disorders you have.  Any surgeries you have had.  Any medical conditions you have.  Whether you are pregnant or may be pregnant. What are the risks? Generally, this is a safe procedure. However, problems may occur, including:  Infection.  Bleeding.  Allergic reactions to medicines.  Damage to other structures or organs. What happens before the procedure?  Ask your health care provider about: ? Changing or stopping your regular medicines. This is especially important if you are taking diabetes medicines or blood thinners. ? Taking medicines such as aspirin and ibuprofen. These medicines can thin your blood. Do not take  these medicines unless your health care provider tells you to take them. ? Taking over-the-counter medicines, vitamins, herbs, and supplements.  Follow instructions from your health care provider about eating or drinking restrictions.  Ask your health care provider what steps will be taken to help prevent infection. These may include: ? Washing skin with a germ-killing soap. ? Taking antibiotic medicine.  You may have an exam or testing, such as: ? X-rays of the bladder, urethra, or kidneys. ? Urine tests to check for signs of infection.  Plan to have someone take you home from the hospital or clinic. What happens during the procedure?   You will be given one or more of the following: ? A medicine to help you relax (sedative). ? A medicine to numb the area (local anesthetic).  The area around the opening of your urethra will be cleaned.  The cystoscope will be passed through your urethra into your bladder.  Germ-free (sterile) fluid will flow through the cystoscope to fill your bladder. The fluid will stretch your bladder so that your health care provider can clearly examine your bladder walls.  Your doctor will look at the urethra and bladder. Your doctor may take a biopsy or remove stones.  The cystoscope will be removed, and your bladder will be emptied. The procedure may vary among health care providers and hospitals. What can I expect after the procedure? After the procedure, it is common to have:  Some soreness or pain in your abdomen and urethra.  Urinary symptoms. These include: ? Mild pain or burning when you urinate. Pain should stop within a few minutes after you urinate. This   may last for up to 1 week. ? A small amount of blood in your urine for several days. ? Feeling like you need to urinate but producing only a small amount of urine. Follow these instructions at home: Medicines  Take over-the-counter and prescription medicines only as told by your health care  provider.  If you were prescribed an antibiotic medicine, take it as told by your health care provider. Do not stop taking the antibiotic even if you start to feel better. General instructions  Return to your normal activities as told by your health care provider. Ask your health care provider what activities are safe for you.  Do not drive for 24 hours if you were given a sedative during your procedure.  Watch for any blood in your urine. If the amount of blood in your urine increases, call your health care provider.  Follow instructions from your health care provider about eating or drinking restrictions.  If a tissue sample was removed for testing (biopsy) during your procedure, it is up to you to get your test results. Ask your health care provider, or the department that is doing the test, when your results will be ready.  Drink enough fluid to keep your urine pale yellow.  Keep all follow-up visits as told by your health care provider. This is important. Contact a health care provider if you:  Have pain that gets worse or does not get better with medicine, especially pain when you urinate.  Have trouble urinating.  Have more blood in your urine. Get help right away if you:  Have blood clots in your urine.  Have abdominal pain.  Have a fever or chills.  Are unable to urinate. Summary  Cystoscopy is a procedure that is used to help diagnose and sometimes treat conditions that affect the lower urinary tract.  Cystoscopy is done using a thin, tube-shaped instrument with a light and camera at the end.  After the procedure, it is common to have some soreness or pain in your abdomen and urethra.  Watch for any blood in your urine. If the amount of blood in your urine increases, call your health care provider.  If you were prescribed an antibiotic medicine, take it as told by your health care provider. Do not stop taking the antibiotic even if you start to feel better. This  information is not intended to replace advice given to you by your health care provider. Make sure you discuss any questions you have with your health care provider. Document Revised: 03/16/2018 Document Reviewed: 03/16/2018 Elsevier Patient Education  2020 [employer]    AMBULATORY SURGERY  DISCHARGE INSTRUCTIONS   1) The drugs that you were given will stay in your system until tomorrow so for the next 24 hours you should not:  A) Drive an automobile B) Make any legal decisions C) Drink any alcoholic beverage   2) You may resume regular meals tomorrow.  Today it is better to start with liquids and gradually work up to solid foods.  You may eat anything you prefer, but it is better to start with liquids, then soup and crackers, and gradually work up to solid foods.   3) Please notify your doctor immediately if you have any unusual bleeding, trouble breathing, redness and pain at the surgery site, drainage, fever, or pain not relieved by medication.    4) Additional Instructions:   Please contact your physician with any problems or Same Day Surgery at [phone], Monday through Friday 6   am to 4 pm, or [facility] number at [phone]. 

## 2019-06-13 NOTE — Anesthesia Procedure Notes (Signed)
Procedure Name: Intubation Date/Time: 06/13/2019 7:57 AM Performed by: Jonna Clark, CRNA Pre-anesthesia Checklist: Patient identified, Patient being monitored, Timeout performed, Emergency Drugs available and Suction available Patient Re-evaluated:Patient Re-evaluated prior to induction Oxygen Delivery Method: Circle system utilized Preoxygenation: Pre-oxygenation with 100% oxygen Induction Type: IV induction Ventilation: Mask ventilation without difficulty Laryngoscope Size: McGraph and 4 Grade View: Grade II Tube type: Oral Tube size: 7.5 mm Number of attempts: 1 Airway Equipment and Method: Stylet Placement Confirmation: ETT inserted through vocal cords under direct vision,  positive ETCO2 and breath sounds checked- equal and bilateral Secured at: 23 cm Tube secured with: Tape Dental Injury: Teeth and Oropharynx as per pre-operative assessment

## 2019-06-13 NOTE — Op Note (Signed)
Date of procedure: 06/13/19  Preoperative diagnosis:  1.  BPH with outlet obstruction 2. Hematuria 3. Bladder stones 4. History of bladder cancer  Postoperative diagnosis:  1. Same as above   Procedure: 1. Cystoscopy 2. Attempted (failed) bilateral retrogrades 3. Evacuation of bladder stones/ cystolithalopaxy  Surgeon: Hollice Espy, MD  Anesthesia: General  Complications: None  Intraoperative findings: Enlarged prostate with trilobar coaptation.  ~20 bladder stones, larges 6 mm in diameter irrigated from bladder, largest extracted via basket.  Unable to perform retrogrades.  EBL: minimal   Specimens: none  Drains: none  Indication: Jeffrey Buchanan is a 64 y.o. patient with bladder outlet obstruction/ BPH.  After reviewing the management options for treatment, he elected to proceed with the above surgical procedure(s). We have discussed the potential benefits and risks of the procedure, side effects of the proposed treatment, the likelihood of the patient achieving the goals of the procedure, and any potential problems that might occur during the procedure or recuperation. Informed consent has been obtained.  Description of procedure:  The patient was taken to the operating room and general anesthesia was induced.  The patient was placed in the dorsal lithotomy position, prepped and draped in the usual sterile fashion, and preoperative antibiotics were administered. A preoperative time-out was performed.   21 [place] scope was advanced per urethra into the bladder.  Notably, the patient had significant trilobar coaptation with an elevated bladder neck.  Within the bladder, there is at least 20 small bladder stones, primarily which were relatively small largest of which measured 6 mm.  The majority of these were able to be evacuated from the bladder through the regular cystoscope sheath.  Only the largest was not amenable to this.  Next, attention was turned to the left ureteral  orifice.  Due to the enlarged prostate and elevated bladder neck, I difficult time cannulating the UO.  I attempted to use a wire on either side but again had difficulty, presumably secondary to J hooking of the distal ureter.  Carefully weighing risk and benefits of performing retrograde pyelogram, elected to abort this procedure.  Complaint for upper tract imaging is deemed necessary if his hematuria fails to resolve.  The remainder of the bladder was carefully inspected.  There was a stellate scar on the right posterior lateral bladder wall but otherwise no bladder lesions.  It was mildly trabeculated consistent with outlet obstruction.  Finally, the last bladder stone was extracted using a 1.9 French tipless nitinol basket and extracted en bloc.  No fragmentation was necessary.  A readvanced the scope to ensure that no residual stones remained in the bladder was adequately cleared.  The bladder was drained and the scope was removed.  Plan: Unable to perform retrograde pyelogram.  Consider upper tract imaging is hematuria persist in the form of CT Urogram.  We will have him follow-up in 4 weeks to reassess.  Hollice Espy, M.D.

## 2019-06-13 NOTE — OR Nursing (Signed)
May resume aspirin and plavix today per Dr. Erlene Quan verbal.  Added to discharge instruction sheet under medication section.

## 2019-06-14 ENCOUNTER — Encounter: Payer: Self-pay | Admitting: Urology

## 2019-06-15 ENCOUNTER — Encounter: Payer: Self-pay | Admitting: Urology

## 2019-06-15 NOTE — Telephone Encounter (Signed)
Spoke with patient operative report was reviewed in detail. Aware of follow up appointment.

## 2019-07-12 ENCOUNTER — Ambulatory Visit (INDEPENDENT_AMBULATORY_CARE_PROVIDER_SITE_OTHER): Payer: BC Managed Care – PPO | Admitting: Physician Assistant

## 2019-07-12 ENCOUNTER — Other Ambulatory Visit: Payer: Self-pay

## 2019-07-12 ENCOUNTER — Encounter: Payer: Self-pay | Admitting: Physician Assistant

## 2019-07-12 VITALS — BP 123/69 | HR 91 | Ht 74.0 in | Wt 260.0 lb

## 2019-07-12 DIAGNOSIS — R350 Frequency of micturition: Secondary | ICD-10-CM | POA: Diagnosis not present

## 2019-07-12 DIAGNOSIS — N401 Enlarged prostate with lower urinary tract symptoms: Secondary | ICD-10-CM

## 2019-07-12 DIAGNOSIS — N3943 Post-void dribbling: Secondary | ICD-10-CM | POA: Diagnosis not present

## 2019-07-12 DIAGNOSIS — R3129 Other microscopic hematuria: Secondary | ICD-10-CM

## 2019-07-12 LAB — BLADDER SCAN AMB NON-IMAGING: Scan Result: 34

## 2019-07-12 MED ORDER — TAMSULOSIN HCL 0.4 MG PO CAPS: 0.8000 mg | ORAL_CAPSULE | Freq: Every day | ORAL | 11 refills | Status: DC

## 2019-07-12 NOTE — Progress Notes (Signed)
07/12/2019 11:04 AM   Jeffrey Buchanan 1955-11-06 [id number]  CC: Postop follow-up  HPI: Jeffrey Buchanan is a 64 y.o. male with PMH BPH, elevated PSA now normalized, microscopic hematuria, bladder cancer, and right groin pain of unknown etiology who presents today for reevaluation 4 weeks following cystoscopy and evacuation of bladder stone/cystolitholapaxy with attempted but failed bilateral retrogrades secondary to prostatic enlargement and elevated bladder neck with Dr. Erlene Buchanan.  Today, he reports doing well since surgery.  He has stable frequency and urgency.  No acute concerns.  He denies dysuria and gross hematuria.  In-office UA today positive for 2+ blood; urine microscopy with 11-30 RBCs/HPF. PVR 41.  PMH: Past Medical History:  Diagnosis Date  . Anxiety   . Arthritis   . At risk for sleep apnea    STOP-BANG= 5     SENT TO PCP 09-16-2013  . Bladder cancer ([place])   . Coronary artery disease CARDIOLOGIST-- DR Edyth Gunnels [place])   MI  IN 2005--  S/P  PTCA  X2 STENTS  . DJD (degenerative joint disease)   . GERD (gastroesophageal reflux disease)   . Headache   . History of adenomatous polyp of colon    S/P  PARTIAL COLECTOMY 2006  . History of kidney stones   . History of myocardial infarction    2005--  S/P  STENTING X2  . Hyperlipidemia   . Hypertension   . Myocardial infarction ([place])   . Neuromuscular disorder ([place])   . S/P coronary artery stent placement    2005    Surgical History: Past Surgical History:  Procedure Laterality Date  . CARDIOVASCULAR STRESS TEST  2014  dr Nehemiah Massed   normal / no ischemia  . COLONOSCOPY WITH PROPOFOL N/A 03/04/2017   Procedure: COLONOSCOPY WITH PROPOFOL;  Surgeon: Toledo, Benay Pike, MD;  Location: [facility];  Service: Gastroenterology;  Laterality: N/A;  . CORONARY ANGIOPLASTY WITH STENT PLACEMENT  01/ 2005   [facility]   STENT to RCA  . CYSTOSCOPY W/ RETROGRADES Bilateral 09/26/2013   Procedure: CYSTOSCOPY WITH BILATERAL  RETROGRADE PYELOGRAM;  Surgeon: Sharyn Creamer, MD;  Location: [facility];  Service: Urology;  Laterality: Bilateral;  . CYSTOSCOPY WITH LITHOLAPAXY N/A 06/13/2019   Procedure: CYSTOSCOPY;  Surgeon: Hollice Espy, MD;  Location: [facility] ORS;  Service: Urology;  Laterality: N/A;  . ESOPHAGOGASTRODUODENOSCOPY (EGD) WITH PROPOFOL N/A 03/04/2017   Procedure: ESOPHAGOGASTRODUODENOSCOPY (EGD) WITH PROPOFOL;  Surgeon: Toledo, Benay Pike, MD;  Location: [facility];  Service: Gastroenterology;  Laterality: N/A;  . OPEN REDUCTION SHOULDER DISLOCATION  1976  . PARTIAL COLECTOMY  2006  . STONE EXTRACTION WITH BASKET N/A 06/13/2019   Procedure: STONE EXTRACTION WITH BASKET;  Surgeon: Hollice Espy, MD;  Location: [facility] ORS;  Service: Urology;  Laterality: N/A;  . TRANSTHORACIC ECHOCARDIOGRAM  12-17-2011  dr Nehemiah Massed   mild lv dysfunction/  ef 45%/  moderate lvh/  mild mitral & tricuspid insufficiencey  . TRANSURETHRAL RESECTION OF BLADDER TUMOR  2014    [facility]  . TRANSURETHRAL RESECTION OF BLADDER TUMOR WITH GYRUS (TURBT-GYRUS) N/A 09/26/2013   Procedure: BLADDER BIOPSY;  Surgeon: Sharyn Creamer, MD;  Location: [facility];  Service: Urology;  Laterality: N/A;    Home Medications:  Allergies as of 07/12/2019      Reactions   Escitalopram Oxalate Other (See Comments)   Unknown reaction type      Medication List       Accurate as of July 12, 2019 11:04 AM. If  you have any questions, ask your nurse or doctor.        ALPRAZolam 0.25 MG tablet Commonly known as: XANAX Take 0.25 mg by mouth in the morning and at bedtime.   aspirin EC 81 MG tablet Take 81 mg by mouth at bedtime.   ciprofloxacin 500 MG tablet Commonly known as: CIPRO Take 1 tablet (500 mg total) by mouth every 12 (twelve) hours.   clopidogrel 75 MG tablet Commonly known as: PLAVIX Take 75 mg by mouth at bedtime.   Dexilant 60 MG capsule Generic drug: dexlansoprazole Take 60 mg by mouth  daily.   finasteride 5 MG tablet Commonly known as: PROSCAR Take 1 tablet (5 mg total) by mouth daily. What changed: when to take this   hydrochlorothiazide 12.5 MG tablet Commonly known as: HYDRODIURIL Take 12.5 mg by mouth at bedtime.   HYDROcodone-acetaminophen 5-325 MG tablet Commonly known as: NORCO/VICODIN Take 1 tablet by mouth 4 (four) times daily as needed (pain.).   ibuprofen 200 MG tablet Commonly known as: ADVIL Take 200-400 mg by mouth every 8 (eight) hours as needed (for plantar fasciitis pain).   lisinopril 10 MG tablet Commonly known as: ZESTRIL Take 10 mg by mouth at bedtime.   metoprolol succinate 50 MG 24 hr tablet Commonly known as: TOPROL-XL Take 50 mg by mouth every evening. Take with or immediately following a meal.   rosuvastatin 40 MG tablet Commonly known as: CRESTOR Take 40 mg by mouth at bedtime.   tamsulosin 0.4 MG Caps capsule Commonly known as: FLOMAX Take 0.8 mg by mouth at bedtime.   Vitamin D3 50 MCG (2000 UT) Tabs Take 2,000 Units by mouth at bedtime.       Allergies:  Allergies  Allergen Reactions  . Escitalopram Oxalate Other (See Comments)    Unknown reaction type    Family History: Family History  Problem Relation Age of Onset  . Prostate cancer Brother   . Kidney disease Neg Hx     Social History:   reports that he has been smoking cigarettes. He has a 32.00 pack-year smoking history. He has never used smokeless tobacco. He reports that he does not drink alcohol or use drugs.  Physical Exam: BP 123/69   Pulse 91   Ht 6\' 2"  (1.88 m)   Wt 260 lb (117.9 kg)   BMI 33.38 kg/m   Constitutional:  Alert and oriented, no acute distress, nontoxic appearing HEENT: [place], AT Cardiovascular: No clubbing, cyanosis, or edema Respiratory: Normal respiratory effort, no increased work of breathing Skin: No rashes, bruises or suspicious lesions Neurologic: Grossly intact, no focal deficits, moving all 4 extremities Psychiatric:  Anxious mood and affect  Laboratory Data: Results for orders placed or performed in visit on 07/12/19  Microscopic Examination   URINE  Result Value Ref Range   WBC, UA 0-5 0 - 5 /hpf   RBC 11-30 (A) 0 - 2 /hpf   Epithelial Cells (non renal) 0-10 0 - 10 /hpf   Casts Present (A) None seen /lpf   Cast Type Granular casts (A) N/A   Bacteria, UA Few None seen/Few  Urinalysis, Complete  Result Value Ref Range   Specific Gravity, UA 1.025 1.005 - 1.030   pH, UA 5.0 5.0 - 7.5   Color, UA Yellow Yellow   Appearance Ur Clear Clear   Leukocytes,UA Negative Negative   Protein,UA Negative Negative/Trace   Glucose, UA Negative Negative   Ketones, UA Negative Negative   RBC, UA 2+ (A) Negative  Bilirubin, UA Negative Negative   Urobilinogen, Ur 0.2 0.2 - 1.0 mg/dL   Nitrite, UA Negative Negative   Microscopic Examination See below:   BLADDER SCAN AMB NON-IMAGING  Result Value Ref Range   Scan Result 34    Assessment & Plan:   1. Microscopic hematuria Persistent despite bladder stone evacuation/cystolitholapaxy.  Per Dr. Cherrie Gauze notes, will proceed with CT urogram at this time.  Counseled patient that this is necessary to further evaluate his upper urinary tract for causes of microscopic hematuria, including stones, cysts, and masses.  Patient asks if this test is equivalent to the bilateral retrogrades that were unable to be performed in the OR last month.  I explained that it is. - Urinalysis, Complete - CT HEMATURIA WORKUP  2. Urinary frequency Stable.  PVR WNL today, UA reassuring for infection.  No further intervention indicated. - BLADDER SCAN AMB NON-IMAGING  3. Benign prostatic hyperplasia with post-void dribbling Patient requests refill of Flomax.  Sent to his pharmacy. - tamsulosin (FLOMAX) 0.4 MG CAPS capsule; Take 2 capsules (0.8 mg total) by mouth at bedtime.  Dispense: 30 capsule; Refill: 11   Return in about 4 weeks (around 08/09/2019) for CTU results virtual  visit.  Debroah Loop, PA-C  [facility] [address], [place], [place] [address] [phone]

## 2019-07-13 LAB — URINALYSIS, COMPLETE
Bilirubin, UA: NEGATIVE
Glucose, UA: NEGATIVE
Ketones, UA: NEGATIVE
Leukocytes,UA: NEGATIVE
Nitrite, UA: NEGATIVE
Protein,UA: NEGATIVE
Specific Gravity, UA: 1.025 (ref 1.005–1.030)
Urobilinogen, Ur: 0.2 mg/dL (ref 0.2–1.0)
pH, UA: 5 (ref 5.0–7.5)

## 2019-07-13 LAB — MICROSCOPIC EXAMINATION

## 2019-07-27 ENCOUNTER — Other Ambulatory Visit: Payer: Self-pay

## 2019-07-27 ENCOUNTER — Ambulatory Visit
Admission: RE | Admit: 2019-07-27 | Discharge: 2019-07-27 | Disposition: A | Discharge status: Home / Self Care | Payer: BC Managed Care – PPO | Source: Ambulatory Visit | Attending: Physician Assistant | Admitting: Physician Assistant

## 2019-07-27 DIAGNOSIS — R3129 Other microscopic hematuria: Secondary | ICD-10-CM | POA: Diagnosis present

## 2019-07-27 LAB — POCT I-STAT CREATININE: Creatinine, Ser: 1.1 mg/dL (ref 0.61–1.24)

## 2019-07-27 MED ORDER — IOHEXOL 300 MG/ML  SOLN
125.0000 mL | Freq: Once | INTRAMUSCULAR | Status: AC | PRN
  Administered 2019-07-27: 125 mL via INTRAVENOUS

## 2019-08-04 NOTE — Progress Notes (Signed)
Virtual Visit via Telephone Note  I connected with Jeffrey Buchanan on 08/05/19 at  1:45 PM EDT by telephone and verified that I am speaking with the correct person using two identifiers.  Location: Patient: home Provider: office   I discussed the limitations, risks, security and privacy concerns of performing an evaluation and management service by telephone and the availability of in person appointments. I also discussed with the patient that there may be a patient responsible charge related to this service. The patient expressed understanding and agreed to proceed.  History of Present Illness: Jeffrey Buchanan is a 64 y.o. M who  with PMH BPH, elevated PSA now normalized, microscopic hematuria, bladder cancer, and right groin pain of unknown etiology who presents today for evaluation and management of microscopic hematuria.   S/p cystoscopy and evacuation of bladder stone/cystolitholapaxy with attempted but failed bilateral retrogrades secondary to prostatic enlargement and elevated bladder neck in 06/2019.  He elected not to pursue an outlet procedure.  In-office UA on 07/12/19 positive for 2+ blood; urine microscopy with 11-30 RBCs/HPF. PVR 41.  CT from 07/27/19 revealed bilateral nonobstructing renal stones. Dominant stone is in the lower pole right kidney measuring 2.1 x 2.3 x 1.7 cm. No evidence for ureteral stones. Adjacent tiny 2-3 mm stones are seen in the posterior bladder lumen and bilateral renal cysts also noted. Enlarged prostate observed on imaging.   He has not had groin/testicular pain since his cystolitholapaxy in 06/2019. He reports of incomplete bladder emptying and needs to strain in order to void however better than what it used to be.   He denies flank pain.   Observations/Objective: Pt is engaged and asking good questions.   Pertinent Imagings:  CLINICAL DATA:  Microscopic hematuria. History of bladder cancer.  EXAM: CT ABDOMEN AND PELVIS WITHOUT AND WITH  CONTRAST  TECHNIQUE: Multidetector CT imaging of the abdomen and pelvis was performed following the standard protocol before and following the bolus administration of intravenous contrast.  CONTRAST:  164mL OMNIPAQUE IOHEXOL 300 MG/ML  SOLN  COMPARISON:  None.  FINDINGS: Lower chest: Unremarkable  Hepatobiliary: The liver shows diffusely decreased attenuation suggesting fat deposition. 13 mm hypoattenuating subcapsular lesion inferior right liver (35/9) cannot be definitively characterized but is likely benign and may be a cyst. There is no evidence for gallstones, gallbladder wall thickening, or pericholecystic fluid. No intrahepatic or extrahepatic biliary dilation.  Pancreas: No focal mass lesion. No dilatation of the main duct. No intraparenchymal cyst. No peripancreatic edema.  Spleen: No splenomegaly. No focal mass lesion.  Adrenals/Urinary Tract: No adrenal nodule or mass.  Bilateral renal stones are evident. Dominant stone in the right kidney is lower pole staghorn calculus measuring 2.1 x 2.3 x 1.7 cm. 7 mm stone also identified lower pole right kidney. 3 stones are seen in the left kidney, best appreciated on coronal imaging. 9 mm interpolar stone is associated with a 1 mm interpolar stone (coronal 109/5) and 1 mm lower interpolar stone (114/5). Adjacent tiny 2-3 mm stones are seen in the posterior bladder lumen (well noted on sagittal 108/7).  Imaging after IV contrast administration shows no suspicious enhancing lesion in either kidney. 5 cm simple cyst noted anterior interpolar right kidney with tiny cortical hypodensities in the lower pole right kidney, too small to characterize but likely benign. 13 mm hypoattenuating lesion interpolar left kidney (axial 43/delayed series 17) is also likely a cyst.  Delayed post-contrast imaging shows no wall thickening or soft tissue filling defect in either intrarenal  collecting system or renal pelvis. Neither  ureter is completely opacified but there is no evidence for focal hydroureter or ureteral mass lesion. Delayed imaging of the bladder shows no focal wall thickening.  Stomach/Bowel: Stomach is unremarkable. No gastric wall thickening. No evidence of outlet obstruction. Duodenum is normally positioned as is the ligament of Treitz. No small bowel wall thickening. No small bowel dilatation. The terminal ileum is normal. The appendix is normal. Status post left hemicolectomy.  Vascular/Lymphatic: There is abdominal aortic atherosclerosis without aneurysm. There is no gastrohepatic or hepatoduodenal ligament lymphadenopathy. No retroperitoneal or mesenteric lymphadenopathy. No pelvic sidewall lymphadenopathy.  Reproductive: Prostate gland upper normal to mildly enlarged.  Other: No intraperitoneal free fluid.  Musculoskeletal: No worrisome lytic or sclerotic osseous abnormality.  IMPRESSION: 1. Bilateral nonobstructing renal stones. Dominant stone is in the lower pole right kidney measuring 2.1 x 2.3 x 1.7 cm. No evidence for ureteral stones. 2. Adjacent tiny 2-3 mm stones are seen in the posterior bladder lumen. 3. Bilateral renal cysts. 4. Hepatic steatosis. 5. Aortic Atherosclerosis (ICD10-I70.0).   Electronically Signed   By: Misty Stanley M.D.   On: 07/27/2019 12:39  I have personally reviewed the images and agree with radiologist interpretation.   Assessment and Plan:  1. Right kidney stone  Asymptomatic  CT revealed dominant stone in the lower pole right kidney measuring 2.1 x 2.3 x 1.7 cm Discussed active surveillance of stone w/ KUB once a year vs. PCNL given complicated enlarged prostate anatomy  He has elected active surveillance.   2. Left kidney stone  Left sided stone more likely to cause problem in short term which can be treated We discussed various treatment options including ESWL vs. ureteroscopy, laser lithotripsy, and stent. We discussed the  risks and benefits of both including bleeding, infection, damage to surrounding structures, efficacy with need for possible further intervention, and need for temporary ureteral stent. He may consider ESWL given he is not a good candidate for ureteroscopy  Return in 1 week for in-office visit w/ KUB prior for further evaluation   3. Microscopic hematuria  Likely secondary to stone  Extensive evaluation including kidney and bladder stones, no other pathology cystoscopically and radiologically significant   4. BPH w/ outlet obstruction  Continue Flomax  Declined outlet procedure Worried about rehab however will need outlet procedure in future   I discussed the assessment and treatment plan with the patient. The patient was provided an opportunity to ask questions and all were answered. The patient agreed with the plan and demonstrated an understanding of the instructions.   The patient was advised to call back or seek an in-person evaluation if the symptoms worsen or if the condition fails to improve as anticipated.  I provided 30 minutes of non-face-to-face time during this encounter.   Jamas Lav, am acting as a scribe for Dr. Hollice Espy,  I have reviewed the above documentation for accuracy and completeness, and I agree with the above.   Hollice Espy, MD

## 2019-08-05 ENCOUNTER — Other Ambulatory Visit: Payer: Self-pay

## 2019-08-05 ENCOUNTER — Other Ambulatory Visit: Payer: Self-pay | Admitting: Urology

## 2019-08-05 ENCOUNTER — Telehealth (INDEPENDENT_AMBULATORY_CARE_PROVIDER_SITE_OTHER): Payer: BC Managed Care – PPO | Admitting: Urology

## 2019-08-05 ENCOUNTER — Encounter: Payer: Self-pay | Admitting: Urology

## 2019-08-05 DIAGNOSIS — N138 Other obstructive and reflux uropathy: Secondary | ICD-10-CM

## 2019-08-05 DIAGNOSIS — N200 Calculus of kidney: Secondary | ICD-10-CM

## 2019-08-05 DIAGNOSIS — N401 Enlarged prostate with lower urinary tract symptoms: Secondary | ICD-10-CM | POA: Diagnosis not present

## 2019-08-09 NOTE — Progress Notes (Signed)
08/10/19 2:34 PM   Jeffrey Buchanan [birth year] [id number]  Referring provider: [name], MD [facility] [place],  [place] [address] Chief Complaint  Patient presents with  . bladder stones    HPI: Jeffrey Buchanan is a 64 y.o. M who presents today for with KUB for further discussion of stone management.    S/p cystoscopy and evacuation of bladder stone/cystolitholapaxy with attempted but failed bilateral retrogrades secondary to prostatic enlargement and elevated bladder neck in 06/2019.   In-office UA on 07/12/19 positive for2+ blood; urine microscopy with11-30RBCs/HPF. PVR 41.  CT from 07/27/19 revealed bilateral nonobstructing renal stones. Dominant stone is in the lower pole right kidney measuring 2.1 x 2.3 x 1.7 cm. No evidence for ureteral stones. Adjacent tiny 2-3 mm stones are seen in the posterior bladder lumen and bilateral renal cysts also noted. Enlarged prostate observed on imaging.   He has not had groin/testicular pain since his cystolitholapaxy in 06/2019.   His KUB today consistent w/ previous CT.   No complaints today. He denies flank pain, gross hematuria, or dysuria.   PMH: Past Medical History:  Diagnosis Date  . Anxiety   . Arthritis   . At risk for sleep apnea    STOP-BANG= 5     SENT TO PCP 09-16-2013  . Bladder cancer ([place])   . Coronary artery disease CARDIOLOGIST-- DR Edyth Gunnels [place])   MI  IN 2005--  S/P  PTCA  X2 STENTS  . DJD (degenerative joint disease)   . GERD (gastroesophageal reflux disease)   . Headache   . History of adenomatous polyp of colon    S/P  PARTIAL COLECTOMY 2006  . History of kidney stones   . History of myocardial infarction    2005--  S/P  STENTING X2  . Hyperlipidemia   . Hypertension   . Myocardial infarction ([place])   . Neuromuscular disorder ([place])   . S/P coronary artery stent placement    2005    Surgical History: Past Surgical History:  Procedure Laterality Date  .  CARDIOVASCULAR STRESS TEST  2014  dr Nehemiah Massed   normal / no ischemia  . COLONOSCOPY WITH PROPOFOL N/A 03/04/2017   Procedure: COLONOSCOPY WITH PROPOFOL;  Surgeon: Toledo, Benay Pike, MD;  Location: [facility];  Service: Gastroenterology;  Laterality: N/A;  . CORONARY ANGIOPLASTY WITH STENT PLACEMENT  01/ 2005   [facility]   STENT to RCA  . CYSTOSCOPY W/ RETROGRADES Bilateral 09/26/2013   Procedure: CYSTOSCOPY WITH BILATERAL RETROGRADE PYELOGRAM;  Surgeon: Sharyn Creamer, MD;  Location: [facility];  Service: Urology;  Laterality: Bilateral;  . CYSTOSCOPY WITH LITHOLAPAXY N/A 06/13/2019   Procedure: CYSTOSCOPY;  Surgeon: Hollice Espy, MD;  Location: [facility]S;  Service: Urology;  Laterality: N/A;  . ESOPHAGOGASTRODUODENOSCOPY (EGD) WITH PROPOFOL N/A 03/04/2017   Procedure: ESOPHAGOGASTRODUODENOSCOPY (EGD) WITH PROPOFOL;  Surgeon: Toledo, Benay Pike, MD;  Location: [facility];  Service: Gastroenterology;  Laterality: N/A;  . OPEN REDUCTION SHOULDER DISLOCATION  1976  . PARTIAL COLECTOMY  2006  . STONE EXTRACTION WITH BASKET N/A 06/13/2019   Procedure: STONE EXTRACTION WITH BASKET;  Surgeon: Hollice Espy, MD;  Location: [facility];  Service: Urology;  Laterality: N/A;  . TRANSTHORACIC ECHOCARDIOGRAM  12-17-2011  dr Nehemiah Massed   mild lv dysfunction/  ef 45%/  moderate lvh/  mild mitral & tricuspid insufficiencey  . TRANSURETHRAL RESECTION OF BLADDER TUMOR  2014    [facility]  . TRANSURETHRAL RESECTION OF BLADDER TUMOR  WITH GYRUS (TURBT-GYRUS) N/A 09/26/2013   Procedure: BLADDER BIOPSY;  Surgeon: Sharyn Creamer, MD;  Location: [facility];  Service: Urology;  Laterality: N/A;    Home Medications:  Allergies as of 08/10/2019      Reactions   Escitalopram Oxalate Other (See Comments)   Unknown reaction type      Medication List       Accurate as of Aug 10, 2019  2:34 PM. If you have any questions, ask your nurse or doctor.        STOP taking these medications     ciprofloxacin 500 MG tablet Commonly known as: CIPRO Stopped by: Hollice Espy, MD     TAKE these medications   ALPRAZolam 0.25 MG tablet Commonly known as: XANAX Take 0.25 mg by mouth in the morning and at bedtime.   aspirin EC 81 MG tablet Take 81 mg by mouth at bedtime.   clopidogrel 75 MG tablet Commonly known as: PLAVIX Take 75 mg by mouth at bedtime.   Dexilant 60 MG capsule Generic drug: dexlansoprazole Take 60 mg by mouth daily.   finasteride 5 MG tablet Commonly known as: PROSCAR Take 1 tablet (5 mg total) by mouth daily. What changed: when to take this   hydrochlorothiazide 12.5 MG tablet Commonly known as: HYDRODIURIL Take 12.5 mg by mouth at bedtime.   HYDROcodone-acetaminophen 5-325 MG tablet Commonly known as: NORCO/VICODIN Take 1 tablet by mouth 4 (four) times daily as needed (pain.).   ibuprofen 200 MG tablet Commonly known as: ADVIL Take 200-400 mg by mouth every 8 (eight) hours as needed (for plantar fasciitis pain).   lisinopril 10 MG tablet Commonly known as: ZESTRIL Take 10 mg by mouth at bedtime.   metoprolol succinate 50 MG 24 hr tablet Commonly known as: TOPROL-XL Take 50 mg by mouth every evening. Take with or immediately following a meal.   rosuvastatin 40 MG tablet Commonly known as: CRESTOR Take 40 mg by mouth at bedtime.   tamsulosin 0.4 MG Caps capsule Commonly known as: FLOMAX Take 2 capsules (0.8 mg total) by mouth at bedtime.   Vitamin D3 50 MCG (2000 UT) Tabs Take 2,000 Units by mouth at bedtime.       Allergies:  Allergies  Allergen Reactions  . Escitalopram Oxalate Other (See Comments)    Unknown reaction type    Family History: Family History  Problem Relation Age of Onset  . Prostate cancer Brother   . Kidney disease Neg Hx     Social History:  reports that he has been smoking cigarettes. He has a 32.00 pack-year smoking history. He has never used smokeless tobacco. He reports that he does not drink  alcohol or use drugs.   Physical Exam: BP 121/68   Pulse (!) 106   Constitutional:  Alert and oriented, No acute distress. HEENT: [place] AT, moist mucus membranes.  Trachea midline, no masses. Cardiovascular: No clubbing, cyanosis, or edema. Respiratory: Normal respiratory effort, no increased work of breathing. Skin: No rashes, bruises or suspicious lesions. Neurologic: Grossly intact, no focal deficits, moving all 4 extremities. Psychiatric: Normal mood and affect.  Laboratory Data:  Lab Results  Component Value Date   CREATININE 1.10 07/27/2019   Pertinent Imaging: KUB personally reviewed. See Epic.   Assessment & Plan:    1. Left kidney stone KUB consistent w/ CT scan   Left sided stone more likely to cause problem in short term which can be treated We discussed various treatment options including active  surveillance vs. ESWL vs. ureteroscopy, laser lithotripsy, and stent.   We discussed the risks and benefits of both including bleeding, infection, damage to surrounding structures, efficacy with need for possible further intervention, steinstrasse and need for temporary ureteral stent.  He elected for ESWL given he is not a good candidate for ureteroscopy  Return for ESWL   2. Right kidney stone  Asymptomatic  CT revealed dominant stone in the lower pole right kidney measuring 2.1 x 2.3 x 1.7 cm Discussed active surveillance of stone w/ KUB once a year vs. PCNL given complicated enlarged prostate anatomy  No obvious sequela from the stone including no recurrent infections, flank pain or cortical loss/obstruction He has elected surveillance.   3. Microscopic hematuria  Likely secondary to stone  Extensive evaluation including kidney and bladder stones, no other pathology cystoscopically and radiologically significant    [facility] [address] [place], [place] [address] [phone]  I, Lucas Mallow, am acting as a  scribe for Dr. Hollice Espy,  I have reviewed the above documentation for accuracy and completeness, and I agree with the above.   Hollice Espy, MD

## 2019-08-10 ENCOUNTER — Ambulatory Visit
Admission: RE | Admit: 2019-08-10 | Discharge: 2019-08-10 | Disposition: A | Discharge status: Home / Self Care | Payer: BC Managed Care – PPO | Source: Ambulatory Visit | Attending: Urology | Admitting: Urology

## 2019-08-10 ENCOUNTER — Other Ambulatory Visit: Payer: Self-pay | Admitting: Radiology

## 2019-08-10 ENCOUNTER — Other Ambulatory Visit: Payer: Self-pay

## 2019-08-10 ENCOUNTER — Ambulatory Visit (INDEPENDENT_AMBULATORY_CARE_PROVIDER_SITE_OTHER): Payer: BC Managed Care – PPO | Admitting: Urology

## 2019-08-10 VITALS — BP 121/68 | HR 106

## 2019-08-10 DIAGNOSIS — N200 Calculus of kidney: Secondary | ICD-10-CM

## 2019-08-10 DIAGNOSIS — R3129 Other microscopic hematuria: Secondary | ICD-10-CM

## 2019-08-11 ENCOUNTER — Telehealth: Payer: Self-pay | Admitting: Urology

## 2019-08-11 ENCOUNTER — Other Ambulatory Visit: Payer: Self-pay | Admitting: Radiology

## 2019-08-11 DIAGNOSIS — N200 Calculus of kidney: Secondary | ICD-10-CM

## 2019-08-11 LAB — URINALYSIS, COMPLETE
Bilirubin, UA: NEGATIVE
Glucose, UA: NEGATIVE
Ketones, UA: NEGATIVE
Leukocytes,UA: NEGATIVE
Nitrite, UA: NEGATIVE
Protein,UA: NEGATIVE
Specific Gravity, UA: 1.015 (ref 1.005–1.030)
Urobilinogen, Ur: 0.2 mg/dL (ref 0.2–1.0)
pH, UA: 5 (ref 5.0–7.5)

## 2019-08-11 LAB — MICROSCOPIC EXAMINATION: Bacteria, UA: NONE SEEN

## 2019-08-12 LAB — CULTURE, URINE COMPREHENSIVE

## 2019-08-13 ENCOUNTER — Ambulatory Visit: Payer: BC Managed Care – PPO | Attending: Internal Medicine

## 2019-08-13 DIAGNOSIS — Z23 Encounter for immunization: Secondary | ICD-10-CM

## 2019-08-13 NOTE — Progress Notes (Signed)
   Covid-19 Vaccination Clinic  Name:  Jeffrey Buchanan    MRN: [id number] DOB: 1955-09-23  08/13/2019  [name] was observed post Covid-19 immunization for 15 minutes without incident. He was provided with Vaccine Information Sheet and instruction to access the V-Safe system.   [name] was instructed to call 911 with any severe reactions post vaccine: Marland Kitchen Difficulty breathing  . Swelling of face and throat  . A fast heartbeat  . A bad rash all over body  . Dizziness and weakness   Immunizations Administered    Name Date Dose VIS Date Route   Pfizer COVID-19 Vaccine 08/13/2019  9:59 AM 0.3 mL 06/01/2018 Intramuscular   Manufacturer: [place]   Lot: [id number]   [place]: [id number]

## 2019-08-17 ENCOUNTER — Ambulatory Visit: Payer: BC Managed Care – PPO | Admitting: Urology

## 2019-08-23 ENCOUNTER — Other Ambulatory Visit
Admission: RE | Admit: 2019-08-23 | Discharge: 2019-08-23 | Disposition: A | Discharge status: Home / Self Care | Payer: BC Managed Care – PPO | Source: Ambulatory Visit | Attending: Urology | Admitting: Urology

## 2019-08-23 DIAGNOSIS — Z01812 Encounter for preprocedural laboratory examination: Secondary | ICD-10-CM | POA: Diagnosis present

## 2019-08-23 DIAGNOSIS — Z20822 Contact with and (suspected) exposure to covid-19: Secondary | ICD-10-CM | POA: Insufficient documentation

## 2019-08-23 LAB — SARS CORONAVIRUS 2 (TAT 6-24 HRS): SARS Coronavirus 2: NEGATIVE

## 2019-09-06 ENCOUNTER — Ambulatory Visit: Payer: BC Managed Care – PPO | Attending: Internal Medicine

## 2019-09-06 DIAGNOSIS — Z23 Encounter for immunization: Secondary | ICD-10-CM

## 2019-09-06 NOTE — Progress Notes (Signed)
   Covid-19 Vaccination Clinic  Name:  Jeffrey Buchanan    MRN: [id number] DOB: 11-12-1955  09/06/2019  Jeffrey Buchanan was observed post Covid-19 immunization for 15 minutes without incident. He was provided with Vaccine Information Sheet and instruction to access the V-Safe system.   Jeffrey Buchanan was instructed to call 911 with any severe reactions post vaccine: Marland Kitchen Difficulty breathing  . Swelling of face and throat  . A fast heartbeat  . A bad rash all over body  . Dizziness and weakness   Immunizations Administered    Name Date Dose VIS Date Route   Pfizer COVID-19 Vaccine 09/06/2019 10:12 AM 0.3 mL 06/01/2018 Intramuscular   Manufacturer: [place]   Lot: [id number]   [place]: [id number]

## 2019-09-08 ENCOUNTER — Ambulatory Visit: Payer: BC Managed Care – PPO | Admitting: Physician Assistant

## 2019-09-13 ENCOUNTER — Other Ambulatory Visit: Payer: Self-pay | Admitting: Radiology

## 2019-09-13 ENCOUNTER — Other Ambulatory Visit: Payer: Self-pay

## 2019-09-13 ENCOUNTER — Other Ambulatory Visit
Admission: RE | Admit: 2019-09-13 | Discharge: 2019-09-13 | Disposition: A | Discharge status: Home / Self Care | Payer: BC Managed Care – PPO | Source: Ambulatory Visit | Attending: Urology | Admitting: Urology

## 2019-09-13 DIAGNOSIS — Z01812 Encounter for preprocedural laboratory examination: Secondary | ICD-10-CM | POA: Insufficient documentation

## 2019-09-13 DIAGNOSIS — Z20822 Contact with and (suspected) exposure to covid-19: Secondary | ICD-10-CM | POA: Diagnosis not present

## 2019-09-14 ENCOUNTER — Other Ambulatory Visit: Payer: Self-pay | Admitting: Radiology

## 2019-09-14 ENCOUNTER — Telehealth: Payer: Self-pay | Admitting: Urology

## 2019-09-14 DIAGNOSIS — N200 Calculus of kidney: Secondary | ICD-10-CM

## 2019-09-14 LAB — SARS CORONAVIRUS 2 (TAT 6-24 HRS): SARS Coronavirus 2: NEGATIVE

## 2019-09-14 NOTE — Telephone Encounter (Signed)
Patient requests to reschedule shockwave lithotripsy on 10/27/2019 due to a family emergency.

## 2019-09-14 NOTE — Telephone Encounter (Signed)
Pt left vm for Amy regarding his procedure tomorrow he needs to r/s please call pt

## 2019-09-15 ENCOUNTER — Ambulatory Visit: Admission: RE | Admit: 2019-09-15 | Payer: BC Managed Care – PPO | Source: Home / Self Care | Admitting: Urology

## 2019-09-15 ENCOUNTER — Encounter: Admission: RE | Payer: Self-pay | Source: Home / Self Care

## 2019-09-15 SURGERY — LITHOTRIPSY, ESWL
Anesthesia: Moderate Sedation | Laterality: Left

## 2019-09-19 ENCOUNTER — Encounter: Payer: Self-pay | Admitting: Urology

## 2019-09-29 ENCOUNTER — Ambulatory Visit: Payer: BC Managed Care – PPO | Admitting: Physician Assistant

## 2019-10-18 ENCOUNTER — Encounter: Payer: Self-pay | Admitting: Urology

## 2019-10-18 ENCOUNTER — Other Ambulatory Visit: Payer: Self-pay | Admitting: Radiology

## 2019-10-20 ENCOUNTER — Other Ambulatory Visit: Payer: BC Managed Care – PPO

## 2019-10-25 ENCOUNTER — Other Ambulatory Visit: Payer: BC Managed Care – PPO

## 2019-10-27 ENCOUNTER — Ambulatory Visit: Admit: 2019-10-27 | Payer: BC Managed Care – PPO | Admitting: Urology

## 2019-10-27 SURGERY — LITHOTRIPSY, ESWL
Anesthesia: Moderate Sedation | Laterality: Left

## 2019-11-10 ENCOUNTER — Ambulatory Visit: Payer: BC Managed Care – PPO | Admitting: Physician Assistant

## 2019-11-14 ENCOUNTER — Ambulatory Visit: Payer: Self-pay | Admitting: Physician Assistant

## 2019-11-30 ENCOUNTER — Telehealth: Payer: Self-pay | Admitting: Urology

## 2019-11-30 ENCOUNTER — Other Ambulatory Visit: Payer: Self-pay | Admitting: Physician Assistant

## 2019-11-30 DIAGNOSIS — N3943 Post-void dribbling: Secondary | ICD-10-CM

## 2019-11-30 NOTE — Telephone Encounter (Signed)
Patient is requesting a refill on his Flomax, but he never had his ESWL.  Is he wanting to reschedule this at this time?

## 2019-12-01 ENCOUNTER — Other Ambulatory Visit: Payer: Self-pay | Admitting: Radiology

## 2019-12-01 DIAGNOSIS — N3943 Post-void dribbling: Secondary | ICD-10-CM

## 2019-12-01 MED ORDER — TAMSULOSIN HCL 0.4 MG PO CAPS: 0.8000 mg | ORAL_CAPSULE | Freq: Every day | ORAL | 11 refills | Status: DC

## 2019-12-01 NOTE — Telephone Encounter (Signed)
Patient states he "feels fine" and will keep follow up appointment with Dr Erlene Quan scheduled December 2021 with KUB prior. He would like a refill of tamsulosin. Script sent to pharmacy.

## 2019-12-15 ENCOUNTER — Other Ambulatory Visit: Payer: Self-pay | Admitting: Physician Assistant

## 2019-12-15 DIAGNOSIS — N3943 Post-void dribbling: Secondary | ICD-10-CM

## 2020-01-30 ENCOUNTER — Other Ambulatory Visit: Payer: Self-pay | Admitting: Urology

## 2020-03-13 ENCOUNTER — Ambulatory Visit
Admission: RE | Admit: 2020-03-13 | Discharge: 2020-03-13 | Disposition: A | Discharge status: Home / Self Care | Payer: BC Managed Care – PPO | Attending: Urology | Admitting: Urology

## 2020-03-13 ENCOUNTER — Encounter: Payer: Self-pay | Admitting: Urology

## 2020-03-13 ENCOUNTER — Other Ambulatory Visit: Payer: Self-pay

## 2020-03-13 ENCOUNTER — Ambulatory Visit
Admission: RE | Admit: 2020-03-13 | Discharge: 2020-03-13 | Disposition: A | Discharge status: Home / Self Care | Payer: BC Managed Care – PPO | Source: Ambulatory Visit | Attending: Urology | Admitting: Urology

## 2020-03-13 ENCOUNTER — Ambulatory Visit (INDEPENDENT_AMBULATORY_CARE_PROVIDER_SITE_OTHER): Payer: BC Managed Care – PPO | Admitting: Urology

## 2020-03-13 ENCOUNTER — Other Ambulatory Visit: Payer: BC Managed Care – PPO

## 2020-03-13 VITALS — BP 125/74 | HR 89 | Ht 74.0 in | Wt 260.0 lb

## 2020-03-13 DIAGNOSIS — N401 Enlarged prostate with lower urinary tract symptoms: Secondary | ICD-10-CM

## 2020-03-13 DIAGNOSIS — N200 Calculus of kidney: Secondary | ICD-10-CM | POA: Insufficient documentation

## 2020-03-13 DIAGNOSIS — N3943 Post-void dribbling: Secondary | ICD-10-CM

## 2020-03-13 DIAGNOSIS — R3129 Other microscopic hematuria: Secondary | ICD-10-CM

## 2020-03-13 DIAGNOSIS — Z8551 Personal history of malignant neoplasm of bladder: Secondary | ICD-10-CM

## 2020-03-13 DIAGNOSIS — Z87898 Personal history of other specified conditions: Secondary | ICD-10-CM

## 2020-03-13 LAB — URINALYSIS, COMPLETE
Bilirubin, UA: NEGATIVE
Glucose, UA: NEGATIVE
Ketones, UA: NEGATIVE
Leukocytes,UA: NEGATIVE
Nitrite, UA: NEGATIVE
Protein,UA: NEGATIVE
Specific Gravity, UA: >1.03 — ABNORMAL HIGH (ref 1.005–1.030)
Urobilinogen, Ur: 0.2 mg/dL (ref 0.2–1.0)
pH, UA: 5 (ref 5.0–7.5)

## 2020-03-13 LAB — MICROSCOPIC EXAMINATION: Bacteria, UA: NONE SEEN

## 2020-03-13 LAB — BLADDER SCAN AMB NON-IMAGING: Scan Result: 10

## 2020-03-13 NOTE — Addendum Note (Signed)
Addended by: Alvera Novel on: 03/13/2020 04:23 PM   Modules accepted: Orders

## 2020-03-13 NOTE — Progress Notes (Signed)
03/13/2020 9:29 AM   Jeffrey Buchanan 01/16/1956 [id number]  Referring provider: [name], MD [facility] [place],   [id number]  Chief Complaint  Patient presents with  . Hematuria    HPI: 64 year old male with a personal history of bladder outlet obstruction/BPH with bladder stones who returns today for follow-up.  He was last taken to the operating room on 06/2019 cystoscopy/evacuation of bladder stones/cystolitholapaxy.  He declined an outlet procedure.    CT Urogram from 07/27/19 revealed bilateral nonobstructing renal stones. Dominant stone is in the lower pole right kidney measuring 2.1 x 2.3 x 1.7 cm. No evidence for ureteral stones. Adjacent tiny 2-3 mm stones are seen in the posterior bladder lumen and bilateral renal cysts also noted. Enlarged prostate observed on imaging.   He previously entertain the idea of ESWL for left-sided midpole stone but ultimately decided not to go through with this.  He also has a personal history of elevated PSA.  His PSA was 4.8 in 01/2019.  Per previous notes, its been in the 5 range for many years.  He reports that this was just checked recently by his primary care physician.  He has labs that are performed at an outside facility.  He does not have a copy of his labs or know what his PSA was.  He also has a personal history of bladder cancer status post TURBT in 2014 without recurrence.  KUB was obtained today is somewhat obscured by bowel gas.  Overall, it does appear that his stone burden is essentially stable, dominant right lower pole stone measuring greater than 2 cm as well as a nonobstructing left-sided stone.  No new obvious bladder stones appreciated.  Continues to have urinary symptoms including weak stream, sensation of incomplete emptying.  He is on maximal medical therapy with Flomax and finasteride.   IPSS    Row Name 03/13/20 0900         International Prostate Symptom Score    How often have you had the sensation of not emptying your bladder? Less than half the time     How often have you had to urinate less than every two hours? More than half the time     How often have you found you stopped and started again several times when you urinated? Less than half the time     How often have you found it difficult to postpone urination? Less than half the time     How often have you had a weak urinary stream? About half the time     How often have you had to strain to start urination? Not at All     How many times did you typically get up at night to urinate? 2 Times     Total IPSS Score 15       Quality of Life due to urinary symptoms   If you were to spend the rest of your life with your urinary condition just the way it is now how would you feel about that? Mixed            Score:  1-7 Mild 8-19 Moderate 20-35 Severe    PMH: Past Medical History:  Diagnosis Date  . Anxiety   . Arthritis   . At risk for sleep apnea    STOP-BANG= 5     SENT TO PCP 09-16-2013  . Bladder cancer ([place])   . Coronary artery disease CARDIOLOGIST-- DR Edyth Gunnels [facility])  MI  IN 2005--  S/P  PTCA  X2 STENTS  . DJD (degenerative joint disease)   . GERD (gastroesophageal reflux disease)   . Headache   . History of adenomatous polyp of colon    S/P  PARTIAL COLECTOMY 2006  . History of kidney stones   . History of myocardial infarction    2005--  S/P  STENTING X2  . Hyperlipidemia   . Hypertension   . Myocardial infarction ([place])   . Neuromuscular disorder ([place])   . S/P coronary artery stent placement    2005    Surgical History: Past Surgical History:  Procedure Laterality Date  . CARDIOVASCULAR STRESS TEST  2014  dr Nehemiah Massed   normal / no ischemia  . COLONOSCOPY WITH PROPOFOL N/A 03/04/2017   Procedure: COLONOSCOPY WITH PROPOFOL;  Surgeon: Toledo, Benay Pike, MD;  Location: [facility] ENDOSCOPY;  Service: Gastroenterology;  Laterality: N/A;  . CORONARY ANGIOPLASTY WITH  STENT PLACEMENT  01/ 2005   [facility]   STENT to RCA  . CYSTOSCOPY W/ RETROGRADES Bilateral 09/26/2013   Procedure: CYSTOSCOPY WITH BILATERAL RETROGRADE PYELOGRAM;  Surgeon: Sharyn Creamer, MD;  [facility];  Service: Urology;  Laterality: Bilateral;  . CYSTOSCOPY WITH LITHOLAPAXY N/A 06/13/2019   Procedure: CYSTOSCOPY;  Surgeon: Hollice Espy, MD;  Location: [facility] ORS;  Service: Urology;  Laterality: N/A;  . ESOPHAGOGASTRODUODENOSCOPY (EGD) WITH PROPOFOL N/A 03/04/2017   Procedure: ESOPHAGOGASTRODUODENOSCOPY (EGD) WITH PROPOFOL;  Surgeon: Toledo, Benay Pike, MD;  Location: [facility] ENDOSCOPY;  Service: Gastroenterology;  Laterality: N/A;  . OPEN REDUCTION SHOULDER DISLOCATION  1976  . PARTIAL COLECTOMY  2006  . STONE EXTRACTION WITH BASKET N/A 06/13/2019   Procedure: STONE EXTRACTION WITH BASKET;  Surgeon: Hollice Espy, MD;  Location: [facility] ORS;  Service: Urology;  Laterality: N/A;  . TRANSTHORACIC ECHOCARDIOGRAM  12-17-2011  dr Nehemiah Massed   mild lv dysfunction/  ef 45%/  moderate lvh/  mild mitral & tricuspid insufficiencey  . TRANSURETHRAL RESECTION OF BLADDER TUMOR  2014    [facility]  . TRANSURETHRAL RESECTION OF BLADDER TUMOR WITH GYRUS (TURBT-GYRUS) N/A 09/26/2013   Procedure: BLADDER BIOPSY;  Surgeon: Sharyn Creamer, MD;  [facility];  Service: Urology;  Laterality: N/A;    Home Medications:  Allergies as of 03/13/2020      Reactions   Escitalopram Oxalate Other (See Comments)   Unknown reaction type      Medication List       Accurate as of March 13, 2020  9:29 AM. If you have any questions, ask your nurse or doctor.        STOP taking these medications   Dexilant 60 MG capsule Generic drug: dexlansoprazole Stopped by: Hollice Espy, MD     TAKE these medications   ALPRAZolam 0.25 MG tablet Commonly known as: XANAX Take 0.25 mg by mouth in the morning and at bedtime.   aspirin EC 81 MG tablet Take 81 mg by mouth at bedtime.     clopidogrel 75 MG tablet Commonly known as: PLAVIX Take 75 mg by mouth at bedtime.   finasteride 5 MG tablet Commonly known as: PROSCAR TAKE 1 TABLET BY MOUTH EVERY DAY   hydrochlorothiazide 12.5 MG tablet Commonly known as: HYDRODIURIL Take 12.5 mg by mouth at bedtime.   HYDROcodone-acetaminophen 5-325 MG tablet Commonly known as: NORCO/VICODIN Take 1 tablet by mouth 4 (four) times daily as needed (pain.).   ibuprofen 200 MG tablet Commonly known as: ADVIL Take 200-400 mg by mouth every  8 (eight) hours as needed (for plantar fasciitis pain).   lisinopril 10 MG tablet Commonly known as: ZESTRIL Take 10 mg by mouth at bedtime.   metoprolol succinate 50 MG 24 hr tablet Commonly known as: TOPROL-XL Take 50 mg by mouth every evening. Take with or immediately following a meal.   rosuvastatin 40 MG tablet Commonly known as: CRESTOR Take 40 mg by mouth at bedtime.   tamsulosin 0.4 MG Caps capsule Commonly known as: FLOMAX TAKE 2 CAPSULES (0.8 MG TOTAL) BY MOUTH AT BEDTIME.   Vitamin D3 50 MCG (2000 UT) Tabs Take 2,000 Units by mouth at bedtime.       Allergies:  Allergies  Allergen Reactions  . Escitalopram Oxalate Other (See Comments)    Unknown reaction type    Family History: Family History  Problem Relation Age of Onset  . Prostate cancer Brother   . Kidney disease Neg Hx     Social History:  reports that he has been smoking cigarettes. He has a 32.00 pack-year smoking history. He has never used smokeless tobacco. He reports that he does not drink alcohol and does not use drugs.   Physical Exam: BP 125/74   Pulse 89   Ht 6\' 2"  (1.88 m)   Wt 260 lb (117.9 kg)   BMI 33.38 kg/m   Constitutional:  Alert and oriented, No acute distress. HEENT: [place] AT, moist mucus membranes.  Trachea midline, no masses. Cardiovascular: No clubbing, cyanosis, or edema. Respiratory: Normal respiratory effort, no increased work of breathing. Rectal: Refused/declined.   Discussed importance. Skin: No rashes, bruises or suspicious lesions. Neurologic: Grossly intact, no focal deficits, moving all 4 extremities. Psychiatric: Normal mood and affect.  Laboratory Data: Lab Results  Component Value Date   WBC 8.5 06/09/2019   HGB 16.3 06/09/2019   HCT 50.0 06/09/2019   MCV 91.6 06/09/2019   PLT 180 06/09/2019    Lab Results  Component Value Date   CREATININE 1.10 07/27/2019     Urinalysis 3-10 red blood cells per high-power field  Pertinent Imaging: KUB reviewed today, somewhat obstructed by bowel gas but the stone in the left side appears stable.  Stone in the right lower pole is poorly visualized but is grossly stable as well.  Results for orders placed or performed in visit on 03/13/20  BLADDER SCAN AMB NON-IMAGING  Result Value Ref Range   Scan Result 10     Assessment & Plan:    1. Kidney stones Large upper tract stone burden, right partial staghorn left nonobstructing stone  Asymptomatic and grossly stable on KUB today  He is not interested in intervention, will continue to follow serial KUB annually.  Reviewed stone diet recommendations encourage hydration, etc.  2. Benign prostatic hyperplasia with post-void dribbling Significant ongoing obstructive urinary symptoms on maximal medical therapy with a history of bladder stones  Previously declined an outlet procedure but admits today that he would likely need this in the future.  We discussed bladder health today as well as the long-term sequela of chronic outlet obstruction including recurrence of bladder stones and decompensation over time.  He is emptying adequately today  Continue Flomax and finasteride for now, strongly encouraged to consider outlet procedure - BLADDER SCAN AMB NON-IMAGING - Urinalysis, Complete - PSA; Future  3. History of elevated PSA Personal history of elevated PSA on finasteride, recommended PSA/DRE today  Declined DRE.  Initially agreed to having  his PSA drawn but then opted to walkover to his primary care physicians  and see if he could get his PSA that he believes was recently drawn.  If he is unable to find this, he has agreed to have his blood drawn later today.  Discussed the importance of this.  4. History of bladder cancer Personal history of bladder cancer in 2014 without recurrence.  Again encourage continued annual surveillance cystoscopy but he declined  5. Microscopic hematuria Persistent microscopic hematuria, may be stone related  Recent CT urogram within the past 12 months    F/u in 1 year or sooner for IPSS/PVR/PSA/DRE/KUB   Hollice Espy, MD  [facility] [address], [place], [place] [address] [phone]  I spent 40 total minutes on the day of the encounter including pre-visit review of the medical record, face-to-face time with the patient, and post visit ordering of labs/imaging/tests.

## 2020-03-14 LAB — PSA: Prostate Specific Ag, Serum: 2.1 ng/mL (ref 0.0–4.0)

## 2020-04-17 ENCOUNTER — Ambulatory Visit: Payer: Self-pay | Admitting: Urology

## 2020-04-24 ENCOUNTER — Ambulatory Visit: Payer: Self-pay | Admitting: Urology

## 2020-05-21 ENCOUNTER — Other Ambulatory Visit: Payer: Self-pay

## 2020-05-21 ENCOUNTER — Ambulatory Visit (INDEPENDENT_AMBULATORY_CARE_PROVIDER_SITE_OTHER): Payer: BC Managed Care – PPO | Admitting: Dermatology

## 2020-05-21 DIAGNOSIS — L821 Other seborrheic keratosis: Secondary | ICD-10-CM

## 2020-05-21 DIAGNOSIS — Z1283 Encounter for screening for malignant neoplasm of skin: Secondary | ICD-10-CM

## 2020-05-21 DIAGNOSIS — I781 Nevus, non-neoplastic: Secondary | ICD-10-CM

## 2020-05-21 DIAGNOSIS — I872 Venous insufficiency (chronic) (peripheral): Secondary | ICD-10-CM | POA: Diagnosis not present

## 2020-05-21 DIAGNOSIS — L853 Xerosis cutis: Secondary | ICD-10-CM | POA: Diagnosis not present

## 2020-05-21 DIAGNOSIS — L820 Inflamed seborrheic keratosis: Secondary | ICD-10-CM | POA: Diagnosis not present

## 2020-05-21 NOTE — Patient Instructions (Addendum)
For Elbows Recommend Amlactin Rapid Relief, sample given, or Gold Bond Rough and Bumpy

## 2020-05-21 NOTE — Progress Notes (Signed)
New Patient Visit  Subjective  Jeffrey Buchanan is a 65 y.o. male who presents for the following: growths (Face, in visual field, growing/L arm, thighs) and UBSE (No hx of Skin ca).  Some stick out and are irritating. Also has rash on elbows and legs he would like checked.   The following portions of the chart were reviewed this encounter and updated as appropriate:       Review of Systems:  No other skin or systemic complaints except as noted in HPI or Assessment and Plan.  Objective  Well appearing patient in no apparent distress; mood and affect are within normal limits.  All skin waist up examined.  Objective  L infraocular x 1, L upper forehead x 1, L malar cheek x 3, L posterior upper arm x 1, L temple x 1, R thigh x 1 (8), Right infraocular x 2 (2): Erythematous keratotic or waxy stuck-on papules  Objective  Bil elbows: Mild erythema and scale with lichenification  Objective  Bil medial ankles: Spider veins  Objective  Right Lower Leg - Anterior: Stasis changes bil lower legs, pitting edema bil lower legs   Assessment & Plan  Inflamed seborrheic keratosis (10) Right infraocular x 2 (2); L infraocular x 1, L upper forehead x 1, L malar cheek x 3, L posterior upper arm x 1, L temple x 1, R thigh x 1 (8)  LN2 x 8 Snip excision x 2  Destruction of lesion - L infraocular x 1, L upper forehead x 1, L malar cheek x 3, L posterior upper arm x 1, L temple x 1, R thigh x 1  Destruction method: cryotherapy   Informed consent: discussed and consent obtained   Lesion destroyed using liquid nitrogen: Yes   Region frozen until ice ball extended beyond lesion: Yes   Outcome: patient tolerated procedure well with no complications   Post-procedure details: wound care instructions given    Epidermal / dermal shaving - Right infraocular x 2  Lesion diameter (cm):  0.6 Informed consent: discussed and consent obtained   Anesthesia: the lesion was anesthetized in a standard  fashion   Anesthetic:  1% lidocaine w/ epinephrine 1-100,000 buffered w/ 8.4% NaHCO3 Instrument used: scissors   Hemostasis achieved with: pressure, aluminum chloride and electrodesiccation   Outcome: patient tolerated procedure well   Post-procedure details: wound care instructions given   Additional details:  R infraocular x 2  Xerosis cutis Bil elbows  With lichenification, due to persistent pressure on elbows (pt rest arms on table edge), not itchy  Recommend Amlactin Rapid Relief, sample given, or Gold Bond Rough and Bumpy qd/bid  Spider veins Bil medial ankles  Benign, observe  Discussed compression stockings daily  Stasis dermatitis of both legs Right Lower Leg - Anterior  Stasis changes with Pruritus  Benign, itchy at times (pt uses OTC HC cream which helps) Discussed compression socks qam Recommend Amlactin Rapid Relief or Gold Bond Rough and Bumpy cream qd/bid  Discussed Rx Mometasone cream if otc HC cream not effective for itch  Stasis in the legs causes chronic leg swelling, which may result in itchy or painful rashes, skin discoloration, skin texture changes, and sometimes ulceration.  Recommend daily compression hose/stockings- easiest to put on first thing in morning, remove at bedtime.  Elevate legs as much as possible. Avoid salt/sodium rich foods.    Seborrheic Keratoses - Stuck-on, waxy, tan-brown papules and plaques  - Discussed benign etiology and prognosis. - Observe - Call for  any changes - face, R temporal hairline, back  Skin cancer screening performed today.  Return in about 2 months (around 07/19/2020) for ISK f/u.   I, Othelia Pulling, RMA, am acting as scribe for Brendolyn Patty, MD . Documentation: I have reviewed the above documentation for accuracy and completeness, and I agree with the above.  Brendolyn Patty MD

## 2020-07-24 ENCOUNTER — Ambulatory Visit (INDEPENDENT_AMBULATORY_CARE_PROVIDER_SITE_OTHER): Payer: BC Managed Care – PPO | Admitting: Dermatology

## 2020-07-24 ENCOUNTER — Other Ambulatory Visit: Payer: Self-pay

## 2020-07-24 DIAGNOSIS — L300 Nummular dermatitis: Secondary | ICD-10-CM

## 2020-07-24 DIAGNOSIS — L820 Inflamed seborrheic keratosis: Secondary | ICD-10-CM

## 2020-07-24 DIAGNOSIS — L821 Other seborrheic keratosis: Secondary | ICD-10-CM

## 2020-07-24 MED ORDER — TRIAMCINOLONE ACETONIDE 0.1 % EX CREA: 1.0000 "application " | TOPICAL_CREAM | Freq: Two times a day (BID) | CUTANEOUS | 0 refills | Status: DC | PRN

## 2020-07-24 NOTE — Patient Instructions (Addendum)
Cryotherapy Aftercare  . Wash gently with soap and water everyday.   Marland Kitchen Apply Vaseline and Band-Aid daily until healed.   Topical steroids (such as triamcinolone, fluocinolone, fluocinonide, mometasone, clobetasol, halobetasol, betamethasone, hydrocortisone) can cause thinning and lightening of the skin if they are used for too long in the same area. Your physician has selected the right strength medicine for your problem and area affected on the body. Please use your medication only as directed by your physician to prevent side effects. Avoid applying to face, groin, and axilla. Use as directed. Risk of skin atrophy with long-term use reviewed.    If you have any questions or concerns for your doctor, please call our main line at [phone] and press option 4 to reach your doctor's medical assistant. If no one answers, please leave a voicemail as directed and we will return your call as soon as possible. Messages left after 4 pm will be answered the following business day.   You may also send [place] a message via [place]. We typically respond to MyChart messages within 1-2 business days.  For prescription refills, please ask your pharmacy to contact our office. Our fax number is [phone].  If you have an urgent issue when the clinic is closed that cannot wait until the next business day, you can page your doctor at the number below.    Please note that while we do our best to be available for urgent issues outside of office hours, we are not available 24/7.   If you have an urgent issue and are unable to reach [place], you may choose to seek medical care at your doctor's office, retail clinic, urgent care center, or emergency room.  If you have a medical emergency, please immediately call 911 or go to the emergency department.  Pager Numbers  - Dr. Nehemiah Massed: [phone]  - Dr. Laurence Ferrari: [phone]  - Dr. Nicole Kindred: [phone]  In the event of inclement weather, please call our main line at  [phone] for an update on the status of any delays or closures.  Dermatology Medication Tips: Please keep the boxes that topical medications come in in order to help keep track of the instructions about where and how to use these. Pharmacies typically print the medication instructions only on the boxes and not directly on the medication tubes.   If your medication is too expensive, please contact our office at [phone] option 4 or send [place] a message through [place].   We are unable to tell what your co-pay for medications will be in advance as this is different depending on your insurance coverage. However, we may be able to find a substitute medication at lower cost or fill out paperwork to get insurance to cover a needed medication.   If a prior authorization is required to get your medication covered by your insurance company, please allow [place] 1-2 business days to complete this process.  Drug prices often vary depending on where the prescription is filled and some pharmacies may offer cheaper prices.  The website www.goodrx.com contains coupons for medications through different pharmacies. The prices here do not account for what the cost may be with help from insurance (it may be cheaper with your insurance), but the website can give you the price if you did not use any insurance.  - You can print the associated coupon and take it with your prescription to the pharmacy.  - You may also stop by our office during regular business hours and pick up a  GoodRx coupon card.  - If you need your prescription sent electronically to a different pharmacy, notify our office through [facility] or by phone at [phone] option 4.

## 2020-07-24 NOTE — Progress Notes (Signed)
   Follow-Up Visit   Subjective  Jeffrey Buchanan is a 65 y.o. male who presents for the following: Follow-up (Patient here today for 2 month follow up for isk on right thigh and a place on right forehead he would like to discuss. ). He has a couple other irritated, itchy spots on his R thigh he would like checked.  He also has a scaly spot on the tip of his nose.  The following portions of the chart were reviewed this encounter and updated as appropriate:       Objective  Well appearing patient in no apparent distress; mood and affect are within normal limits.  A focused examination was performed including face, right thigh. Relevant physical exam findings are noted in the Assessment and Plan.  Objective  right anterior thigh x 2, left inferior nasal tip x 1, (3): Erythematous keratotic or waxy stuck-on papule  Objective  Right temporal hairline: Stuck-on, waxy, tan-brown plaque   Objective  Right Thigh - Anterior/lateral: Coin shaped pink scaly patch slightly waxy- itches per pt  Assessment & Plan  Inflamed seborrheic keratosis (3) right anterior thigh x 2, left inferior nasal tip x 1,  Vrs wart (nose) Prior to procedure, discussed risks of blister formation, small wound, skin dyspigmentation, or rare scar following cryotherapy.    Destruction of lesion - right anterior thigh x 2, left inferior nasal tip x 1,  Destruction method: cryotherapy   Informed consent: discussed and consent obtained   Lesion destroyed using liquid nitrogen: Yes   Region frozen until ice ball extended beyond lesion: Yes   Outcome: patient tolerated procedure well with no complications   Post-procedure details: wound care instructions given    Seborrheic keratosis Right temporal hairline  Benign, observe.    Discussed treatment options (cryotherapy) and informed could take multiple treatments to remove due to large size  Patient deferred treatment today.       Nummular  dermatitis Right Thigh - Anterior/lateral  Nummular Dermatitis vs ISK vrs porokeratosis Recheck on f/up  Start Triamcinolone cream 0.1 % cream apply to affected areas daily as needed for itch. Avoid applying to face, groin, and axilla. Use as directed. Risk of skin atrophy with long-term use reviewed.   Topical steroids (such as triamcinolone, fluocinolone, fluocinonide, mometasone, clobetasol, halobetasol, betamethasone, hydrocortisone) can cause thinning and lightening of the skin if they are used for too long in the same area. Your physician has selected the right strength medicine for your problem and area affected on the body. Please use your medication only as directed by your physician to prevent side effects.     triamcinolone cream (KENALOG) 0.1 % - Right Thigh - Anterior/lateral  Seborrheic Keratoses - Stuck-on, waxy, tan-brown papules and/or plaques  - Benign-appearing - Discussed benign etiology and prognosis. - Observe - Call for any changes   Return in about 3 months (around 10/23/2020) for isk follow up, recheck R lat thigh.  I, Ruthell Rummage, CMA, am acting as scribe for Brendolyn Patty, MD.  Documentation: I have reviewed the above documentation for accuracy and completeness, and I agree with the above.  Brendolyn Patty MD

## 2020-09-20 ENCOUNTER — Other Ambulatory Visit: Payer: Self-pay | Admitting: Urology

## 2020-09-20 DIAGNOSIS — N3943 Post-void dribbling: Secondary | ICD-10-CM

## 2020-10-09 ENCOUNTER — Other Ambulatory Visit: Payer: Self-pay

## 2020-10-09 ENCOUNTER — Encounter: Payer: Self-pay | Admitting: *Deleted

## 2020-10-09 ENCOUNTER — Emergency Department: Payer: BC Managed Care – PPO

## 2020-10-09 DIAGNOSIS — I2510 Atherosclerotic heart disease of native coronary artery without angina pectoris: Secondary | ICD-10-CM | POA: Diagnosis not present

## 2020-10-09 DIAGNOSIS — N3001 Acute cystitis with hematuria: Secondary | ICD-10-CM | POA: Diagnosis not present

## 2020-10-09 DIAGNOSIS — Z7902 Long term (current) use of antithrombotics/antiplatelets: Secondary | ICD-10-CM | POA: Insufficient documentation

## 2020-10-09 DIAGNOSIS — Z8551 Personal history of malignant neoplasm of bladder: Secondary | ICD-10-CM | POA: Diagnosis not present

## 2020-10-09 DIAGNOSIS — Z79899 Other long term (current) drug therapy: Secondary | ICD-10-CM | POA: Diagnosis not present

## 2020-10-09 DIAGNOSIS — Z7982 Long term (current) use of aspirin: Secondary | ICD-10-CM | POA: Diagnosis not present

## 2020-10-09 DIAGNOSIS — F17210 Nicotine dependence, cigarettes, uncomplicated: Secondary | ICD-10-CM | POA: Diagnosis not present

## 2020-10-09 DIAGNOSIS — I10 Essential (primary) hypertension: Secondary | ICD-10-CM | POA: Diagnosis not present

## 2020-10-09 DIAGNOSIS — R300 Dysuria: Secondary | ICD-10-CM | POA: Diagnosis present

## 2020-10-09 LAB — BASIC METABOLIC PANEL
Anion gap: 9 (ref 5–15)
BUN: 18 mg/dL (ref 8–23)
CO2: 26 mmol/L (ref 22–32)
Calcium: 9.5 mg/dL (ref 8.9–10.3)
Chloride: 105 mmol/L (ref 98–111)
Creatinine, Ser: 1.02 mg/dL (ref 0.61–1.24)
GFR, Estimated: >60 mL/min (ref >60)
Glucose, Bld: 105 mg/dL — ABNORMAL HIGH (ref 70–99)
Potassium: 4.1 mmol/L (ref 3.5–5.1)
Sodium: 140 mmol/L (ref 135–145)

## 2020-10-09 LAB — URINALYSIS, COMPLETE (UACMP) WITH MICROSCOPIC: Specific Gravity, Urine: 1.027 (ref 1.005–1.030)

## 2020-10-09 LAB — CBC
HCT: 52.3 % — ABNORMAL HIGH (ref 39.0–52.0)
Hemoglobin: 16.9 g/dL (ref 13.0–17.0)
MCH: 30.1 pg (ref 26.0–34.0)
MCHC: 32.3 g/dL (ref 30.0–36.0)
MCV: 93.2 fL (ref 80.0–100.0)
Platelets: 177 10*3/uL (ref 150–400)
RBC: 5.61 MIL/uL (ref 4.22–5.81)
RDW: 13.2 % (ref 11.5–15.5)
WBC: 10.6 10*3/uL — ABNORMAL HIGH (ref 4.0–10.5)
nRBC: 0 % (ref 0.0–0.2)

## 2020-10-09 NOTE — ED Triage Notes (Signed)
Pt arrives with c/o bloody and painful urine. Feels like he has to urinate a lot, but only small amounts. Hx of kidney stone, bladder ca

## 2020-10-09 NOTE — ED Notes (Signed)
20ml on bladder scan.

## 2020-10-09 NOTE — ED Triage Notes (Signed)
Pt not able to provide enough for urine sample, specimen cup provided

## 2020-10-10 ENCOUNTER — Encounter: Payer: Self-pay | Admitting: Urology

## 2020-10-10 ENCOUNTER — Emergency Department
Admission: EM | Admit: 2020-10-10 | Discharge: 2020-10-10 | Disposition: A | Discharge status: Home / Self Care | Payer: BC Managed Care – PPO | Attending: Emergency Medicine | Admitting: Emergency Medicine

## 2020-10-10 ENCOUNTER — Telehealth: Payer: Self-pay | Admitting: *Deleted

## 2020-10-10 DIAGNOSIS — N3001 Acute cystitis with hematuria: Secondary | ICD-10-CM

## 2020-10-10 DIAGNOSIS — R310 Gross hematuria: Secondary | ICD-10-CM

## 2020-10-10 DIAGNOSIS — R300 Dysuria: Secondary | ICD-10-CM

## 2020-10-10 MED ORDER — CEPHALEXIN 500 MG PO CAPS: 500.0000 mg | ORAL_CAPSULE | Freq: Four times a day (QID) | ORAL | 0 refills | Status: AC

## 2020-10-10 MED ORDER — CEPHALEXIN 500 MG PO CAPS
500.0000 mg | ORAL_CAPSULE | Freq: Once | ORAL | Status: AC
  Administered 2020-10-10: 500 mg via ORAL
  Filled 2020-10-10: qty 1

## 2020-10-10 NOTE — ED Provider Notes (Signed)
[facility] Emergency Department Provider Note ____________________________________________   Event Date/Time   First MD Initiated Contact with Patient 10/10/20 0024     (approximate)  I have reviewed the triage vital signs and the nursing notes.  HISTORY  Chief Complaint Urinary Retention   HPI Jeffrey Buchanan is a 65 y.o. malewho presents to the ED for evaluation of urinary retention.   Chart review indicates hx CAD, HTN, HLD, obesity.  Chronic pain syndrome on opiates.  History of BPH prescribed tamsulosin.  Nephrolithiasis.  Large intrarenal stones.  Patient presents to the ED for evaluation of dysuria, hematuria over the past 10-12 hours.  He reports noticing symptoms earlier this afternoon, primarily with dysuria, urinary frequency and dribbling of his urine.  He reports that he often dribbles his urine and has some degree of incontinence, but reports it has been worsening throughout the day today.  Further reports hematuria over the past 6-8 hours.  Denies any emesis, fevers, flank pain, groin pain or testicular pain.  Denies penile pain unless he is actively voiding.  Denies additional discharge beyond dribbling of urine.  Past Medical History:  Diagnosis Date   Anxiety    Arthritis    At risk for sleep apnea    STOP-BANG= 5     SENT TO PCP 09-16-2013   Bladder cancer [facility])    Coronary artery disease CARDIOLOGIST-- DR Edyth Gunnels [facility])   MI  IN 2005--  S/P  PTCA  X2 STENTS   DJD (degenerative joint disease)    GERD (gastroesophageal reflux disease)    Headache    History of adenomatous polyp of colon    S/P  PARTIAL COLECTOMY 2006   History of kidney stones    History of myocardial infarction    2005--  S/P  STENTING X2   Hyperlipidemia    Hypertension    Myocardial infarction [facility])    Neuromuscular disorder ([place])    S/P coronary artery stent placement    2005    There are no problems to display for this patient.   Past Surgical  History:  Procedure Laterality Date   CARDIOVASCULAR STRESS TEST  2014  dr Nehemiah Massed   normal / no ischemia   COLONOSCOPY WITH PROPOFOL N/A 03/04/2017   Procedure: COLONOSCOPY WITH PROPOFOL;  Surgeon: Toledo, Benay Pike, MD;  Location: [facility] ENDOSCOPY;  Service: Gastroenterology;  Laterality: N/A;   CORONARY ANGIOPLASTY WITH STENT PLACEMENT  01/ 2005   [facility]   STENT to RCA   CYSTOSCOPY W/ RETROGRADES Bilateral 09/26/2013   Procedure: CYSTOSCOPY WITH BILATERAL RETROGRADE PYELOGRAM;  Surgeon: Sharyn Creamer, MD;  Location: [facility];  Service: Urology;  Laterality: Bilateral;   CYSTOSCOPY WITH LITHOLAPAXY N/A 06/13/2019   Procedure: CYSTOSCOPY;  Surgeon: Hollice Espy, MD;  Location: [facility] ORS;  Service: Urology;  Laterality: N/A;   ESOPHAGOGASTRODUODENOSCOPY (EGD) WITH PROPOFOL N/A 03/04/2017   Procedure: ESOPHAGOGASTRODUODENOSCOPY (EGD) WITH PROPOFOL;  Surgeon: Toledo, Benay Pike, MD;  Location: [facility] ENDOSCOPY;  Service: Gastroenterology;  Laterality: N/A;   OPEN REDUCTION SHOULDER DISLOCATION  1976   PARTIAL COLECTOMY  2006   STONE EXTRACTION WITH BASKET N/A 06/13/2019   Procedure: STONE EXTRACTION WITH BASKET;  Surgeon: Hollice Espy, MD;  Location: [facility] ORS;  Service: Urology;  Laterality: N/A;   TRANSTHORACIC ECHOCARDIOGRAM  12-17-2011  dr Nehemiah Massed   mild lv dysfunction/  ef 45%/  moderate lvh/  mild mitral & tricuspid insufficiencey   TRANSURETHRAL RESECTION OF BLADDER TUMOR  2014  [facility]   TRANSURETHRAL RESECTION OF BLADDER TUMOR WITH GYRUS (TURBT-GYRUS) N/A 09/26/2013   Procedure: BLADDER BIOPSY;  Surgeon: Sharyn Creamer, MD;  Location: [facility];  Service: Urology;  Laterality: N/A;    Prior to Admission medications   Medication Sig Start Date End Date Taking? Authorizing Provider  cephALEXin (KEFLEX) 500 MG capsule Take 1 capsule (500 mg total) by mouth 4 (four) times daily for 7 days. 10/10/20 10/17/20 Yes Vladimir Crofts, MD  ALPRAZolam Duanne Moron) 0.25 MG  tablet Take 0.25 mg by mouth in the morning and at bedtime.  01/31/16   [provider]  aspirin EC 81 MG tablet Take 81 mg by mouth at bedtime.     [provider]  Cholecalciferol (VITAMIN D3) 2000 UNITS TABS Take 2,000 Units by mouth at bedtime.     [provider]  clopidogrel (PLAVIX) 75 MG tablet Take 75 mg by mouth at bedtime.  01/29/16   [provider]  finasteride (PROSCAR) 5 MG tablet TAKE 1 TABLET BY MOUTH EVERY DAY 01/30/20   Hollice Espy, MD  hydrochlorothiazide (HYDRODIURIL) 12.5 MG tablet Take 12.5 mg by mouth at bedtime.  12/31/15   [provider]  HYDROcodone-acetaminophen (NORCO/VICODIN) 5-325 MG tablet Take 1 tablet by mouth 4 (four) times daily as needed (pain.).  02/18/16   [provider]  ibuprofen (ADVIL) 200 MG tablet Take 200-400 mg by mouth every 8 (eight) hours as needed (for plantar fasciitis pain).    [provider]  lisinopril (ZESTRIL) 10 MG tablet Take 10 mg by mouth at bedtime. 05/09/19   [provider]  metoprolol succinate (TOPROL-XL) 50 MG 24 hr tablet Take 50 mg by mouth every evening. Take with or immediately following a meal.    [provider]  rosuvastatin (CRESTOR) 40 MG tablet Take 40 mg by mouth at bedtime. 04/25/19   [provider]  tamsulosin (FLOMAX) 0.4 MG CAPS capsule TAKE 2 CAPSULES (0.8 MG TOTAL) BY MOUTH AT BEDTIME. 09/21/20   McGowan, Larene Beach A, PA-C  triamcinolone cream (KENALOG) 0.1 % Apply 1 application topically 2 (two) times daily as needed (Rash). For itch on right thigh. Avoid applying to face, groin, and axilla. Use as directed. 07/24/20   Brendolyn Patty, MD    Allergies Escitalopram oxalate  Family History  Problem Relation Age of Onset   Prostate cancer Brother    Kidney disease Neg Hx     Social History Social History   Tobacco Use   Smoking status: Every Day    Packs/day: 1.00    Years: 32.00    Pack years: 32.00    Types:  Cigarettes    Last attempt to quit: 09/17/2003    Years since quitting: 17.0   Smokeless tobacco: Never  Vaping Use   Vaping Use: Never used  Substance Use Topics   Alcohol use: No   Drug use: No    Review of Systems  Constitutional: No fever/chills Eyes: No visual changes. ENT: No sore throat. Cardiovascular: Denies chest pain. Respiratory: Denies shortness of breath. Gastrointestinal: No abdominal pain.  No nausea, no vomiting.  No diarrhea.  No constipation. Genitourinary: Positive for dysuria and hematuria. Musculoskeletal: Negative for back pain. Skin: Negative for rash. Neurological: Negative for headaches, focal weakness or numbness.  ____________________________________________   PHYSICAL EXAM:  VITAL SIGNS: Vitals:   10/09/20 2042  BP: 119/67  Pulse: (!) 117  Resp: 18  Temp: 99.1 F (37.3 C)  SpO2: 96%     Constitutional:  Alert and oriented. Well appearing and in no acute distress.  Obese.  Ambulatory with a normal gait. Eyes: Conjunctivae are normal. PERRL. EOMI. Head: Atraumatic. Nose: No congestion/rhinnorhea. Mouth/Throat: Mucous membranes are moist.  Oropharynx non-erythematous. Neck: No stridor. No cervical spine tenderness to palpation. Cardiovascular: Normal rate, regular rhythm. Grossly normal heart sounds.  Good peripheral circulation. Respiratory: Normal respiratory effort.  No retractions. Lungs CTAB. Gastrointestinal: Soft , nondistended, nontender to palpation. No CVA tenderness. Musculoskeletal: No lower extremity tenderness nor edema.  No joint effusions. No signs of acute trauma. Neurologic:  Normal speech and language. No gross focal neurologic deficits are appreciated. No gait instability noted. Skin:  Skin is warm, dry and intact. No rash noted. Psychiatric: Mood and affect are normal. Speech and behavior are normal.  ____________________________________________   LABS (all labs ordered are listed, but only abnormal results are  displayed)  Labs Reviewed  URINALYSIS, COMPLETE (UACMP) WITH MICROSCOPIC - Abnormal; Notable for the following components:      Result Value   Color, Urine RED (*)    APPearance TURBID (*)    Glucose, UA   (*)    Value: TEST NOT REPORTED DUE TO COLOR INTERFERENCE OF URINE PIGMENT   Hgb urine dipstick   (*)    Value: TEST NOT REPORTED DUE TO COLOR INTERFERENCE OF URINE PIGMENT   Bilirubin Urine   (*)    Value: TEST NOT REPORTED DUE TO COLOR INTERFERENCE OF URINE PIGMENT   Ketones, ur   (*)    Value: TEST NOT REPORTED DUE TO COLOR INTERFERENCE OF URINE PIGMENT   Protein, ur   (*)    Value: TEST NOT REPORTED DUE TO COLOR INTERFERENCE OF URINE PIGMENT   Nitrite   (*)    Value: TEST NOT REPORTED DUE TO COLOR INTERFERENCE OF URINE PIGMENT   Leukocytes,Ua   (*)    Value: TEST NOT REPORTED DUE TO COLOR INTERFERENCE OF URINE PIGMENT   All other components within normal limits  BASIC METABOLIC PANEL - Abnormal; Notable for the following components:   Glucose, Bld 105 (*)    All other components within normal limits  CBC - Abnormal; Notable for the following components:   WBC 10.6 (*)    HCT 52.3 (*)    All other components within normal limits  URINE CULTURE   ____________________________________________  12 Lead EKG   ____________________________________________  RADIOLOGY  ED MD interpretation: CT renal study reviewed by me with large right-sided intrarenal stones, no ureterolithiasis or urologic obstruction, few stones within the bladder as well.  Official radiology report(s): CT Renal Stone Study  Result Date: 10/09/2020 CLINICAL DATA:  Flank pain. Urinary retention. History of kidney stone. EXAM: CT ABDOMEN AND PELVIS WITHOUT CONTRAST TECHNIQUE: Multidetector CT imaging of the abdomen and pelvis was performed following the standard protocol without IV contrast. COMPARISON:  CT 07/27/2019 FINDINGS: Lower chest: No pleural effusion or acute airspace disease. Coronary artery  calcifications, normal heart size. Hepatobiliary: Diffusely decreased hepatic density typical of steatosis. Stable subcapsular hypodensity in the right lobe, series 2, image 33, likely cyst. Gallbladder physiologically distended, no calcified stone. No biliary dilatation. Pancreas: No ductal dilatation or inflammation. Spleen: Upper normal in size spanning 13.2 cm. No focal abnormality. Adrenals/Urinary Tract: Normal adrenal glands. Bilateral renal calculi. There is a large stone in the right lower pole calyx measuring 2 cm, increased in size from prior exam. Largest stone in the left kidney measures 7 mm in the interpolar region simple cyst in the anterior right  kidney is similar to prior exam. The hypoattenuating lesion in the left kidney on prior contrast-enhanced CT is grossly stable but not well characterized. Both ureters are decompressed. There are multiple stones in the urinary bladder. A least 4 stones are seen in the midline dependently, largest measuring 10 mm. There is also a punctate calcification at the left urinary trigone. Urinary bladder is near completely empty with wall thickening and mild perivesicular fat stranding. No urethral stones are seen. Stomach/Bowel: Decompressed stomach. No small bowel obstruction or inflammation. Normal appendix. The colon is redundant. Chain sutures are noted in the transverse or redundant sigmoid colon difficult to delineate due to tortuosity. There is a 15 mm nodule in the central anterior abdomen that abuts small bowel loops and colon. Vascular/Lymphatic: Advanced aortic atherosclerosis. Linear calcification in through the central distal aorta consistent with chronic dissection, also seen on prior. No periaortic stranding. Circumaortic left renal vein. Small periportal lymph nodes are typically reactive. Reproductive: Enlarged prostate gland spans 6.8 cm transverse. Other: No free air or ascites. There is a small fat containing supraumbilical ventral abdominal  wall hernia without inflammation. Musculoskeletal: There are no acute or suspicious osseous abnormalities. IMPRESSION: 1. Bilateral nonobstructing intrarenal calculi. Largest stone in the right lower pole calyx measures 2 cm, increased in size from prior exam. No hydronephrosis or obstructive uropathy. 2. Multiple stones in the urinary bladder, largest measuring 10 mm. There is also a punctate calcification at the left urinary trigone. 3. Urinary bladder is near completely empty with wall thickening and mild perivesicular fat stranding, suggesting cystitis. 4. Enlarged prostate gland. 5. Hepatic steatosis. 6. A 15 mm nodule in the central anterior abdomen abuts small bowel loops and colon. This may represent a lymph node, however is nonspecific. Possibility of short segment bowel wall thickening is also considered. Recommend up-to-date colonoscopy. Aortic Atherosclerosis (ICD10-I70.0). Electronically Signed   By: Keith Rake M.D.   On: 10/09/2020 21:37    ____________________________________________   PROCEDURES and INTERVENTIONS  Procedure(s) performed (including Critical Care):  Procedures  Medications  cephALEXin (KEFLEX) capsule 500 mg (500 mg Oral Given 10/10/20 0056)    ____________________________________________   MDM / ED COURSE   65 year old male with history of BPH and intrarenal stone presents to the ED with dysuria, hematuria and worsening incontinence of urine, concerning for acute cystitis, and ultimately amenable to outpatient management.  Exam is reassuring with an ambulatory patient without distress.  Benign abdomen without peritoneal features.  Blood work with minimal leukocytosis, otherwise reassuring with intact renal function.  Urinalysis with gross hematuria.  CT renal study obtained without evidence of ureteral stones or urologic obstruction.  Large intrarenal and bladder stones are noted.  Concern for acute cystitis considering his dysuria, worsening hematuria and  incontinence in the setting of bladder wall thickening on CT.  We will culture his urine and start him on a short course of Keflex.  No significant urinary retention to necessitate Foley catheter.  Will discharge with antibiotics recommendations to follow-up with urology as an outpatient.   Clinical Course as of 10/10/20 0105  Wed Oct 10, 2020  0050 Shared decision-making with the patient regarding the possibility of infection or acute cystitis.  We discussed his dysuria, dribbling changes, CT appearance of his bladder wall.  Shared decision-making yields plan to provide course of antibiotics, culture his urine.  We discussed postvoid residual to assess for acute urinary retention and the possibility of Foley catheter placement.  Patient is agreeable with plan of care. [DS]  0102  Bladder scan with about 200 post void.  No current indications for indwelling Foley catheter placement.  Return to the bedside and discussed with patient my reasoning for antibiotics, he is agreeable.  We discussed following up with Dr. Erlene Quan and we discussed return precautions for the ED. [DS]    Clinical Course User Index [DS] Vladimir Crofts, MD    ____________________________________________   FINAL CLINICAL IMPRESSION(S) / ED DIAGNOSES  Final diagnoses:  Gross hematuria  Dysuria  Acute cystitis with hematuria     ED Discharge Orders          Ordered    cephALEXin (KEFLEX) 500 MG capsule  4 times daily        10/10/20 0104             Kei Langhorst Tamala Julian   Note:  This document was prepared using Dragon voice recognition software and may include unintentional dictation errors.    Vladimir Crofts, MD 10/10/20 [phone]

## 2020-10-10 NOTE — Telephone Encounter (Signed)
Patient was seen in the er and wanted you to look at his scan . I made follow up in 2 weeks.

## 2020-10-10 NOTE — ED Notes (Signed)
Pt reports urinary dribbling since 1600 today.  Pt states urine is bloody.   No back pain.  No n/v/d.  Pt states burning with urination.  Pt alert  Speech clear.

## 2020-10-10 NOTE — Discharge Instructions (Addendum)
Please finish this course of antibiotics to treat possible bladder infection, and follow-up with Dr. Erlene Quan in the clinic.  If you develop any fevers or worsening symptoms despite these measures, please return to the ED.

## 2020-10-10 NOTE — ED Notes (Signed)
Bladder scan 210 mls   md aware

## 2020-10-11 LAB — URINE CULTURE: Culture: NO GROWTH

## 2020-10-12 NOTE — Telephone Encounter (Signed)
Called patient to discuss further-informed next follow up. Advised urine culture was negative. Still having ongoing urgency and dysuria. Would like to know if there is something different he could take to help with his symptoms?

## 2020-10-24 ENCOUNTER — Ambulatory Visit (INDEPENDENT_AMBULATORY_CARE_PROVIDER_SITE_OTHER): Payer: BC Managed Care – PPO | Admitting: Urology

## 2020-10-24 ENCOUNTER — Other Ambulatory Visit: Payer: Self-pay

## 2020-10-24 VITALS — BP 146/82 | HR 85 | Ht 74.0 in | Wt 257.0 lb

## 2020-10-24 DIAGNOSIS — N200 Calculus of kidney: Secondary | ICD-10-CM | POA: Diagnosis not present

## 2020-10-24 DIAGNOSIS — Z87898 Personal history of other specified conditions: Secondary | ICD-10-CM

## 2020-10-24 DIAGNOSIS — N3943 Post-void dribbling: Secondary | ICD-10-CM

## 2020-10-24 DIAGNOSIS — N210 Calculus in bladder: Secondary | ICD-10-CM

## 2020-10-24 DIAGNOSIS — N401 Enlarged prostate with lower urinary tract symptoms: Secondary | ICD-10-CM

## 2020-10-24 NOTE — Patient Instructions (Signed)

## 2020-10-25 LAB — URINALYSIS, COMPLETE
Bilirubin, UA: NEGATIVE
Glucose, UA: NEGATIVE
Ketones, UA: NEGATIVE
Leukocytes,UA: NEGATIVE
Nitrite, UA: NEGATIVE
Specific Gravity, UA: 1.025 (ref 1.005–1.030)
Urobilinogen, Ur: 0.2 mg/dL (ref 0.2–1.0)
pH, UA: 6 (ref 5.0–7.5)

## 2020-10-25 LAB — MICROSCOPIC EXAMINATION: RBC, Urine: >30 /hpf — AB (ref 0–2)

## 2020-10-25 NOTE — Progress Notes (Signed)
10/24/2020 10:04 AM   Jeffrey Buchanan [birth year] [id number]  Referring provider: [name], [facility],  [place] [address]  Chief Complaint  Patient presents with   Hematuria    HPI: 65 year old male with personal history of kidney stones recurrent bladder stones, underlying BPH who presents today for ER follow-up.  He was seen and evaluated on 10/10/2020 for worsening urinary symptoms including urgency, frequency, gross hematuria as well as dysuria.  He had another noncontrast CT scan indicating recurrent bladder stones, at least 4 measuring up to a centimeter each as well as further progression of his right-sided kidney stones now measuring up to 2 cm but remained nonobstructing.  The bladder wall itself appeared to be thick and with some perivesical fat stranding.  Urinalysis was suspicious but urine culture was ultimately negative.  He continued to have urinary symptoms for at least 5 or 6 days after this ER visit.  He was initially prescribed antibiotics which were poorly tolerated.  After his culture came back negative, he is advised that he could stop these.  Today, he reports that his urinary symptoms are back to his baseline.  He is no longer having hematuria urgency or frequency.  Please see previous notes for details.  He is undergone several bladder stone procedures in the past.  Is counseled to undergo an outlet procedure as well however ultimately elected to forego outlet procedure on previous occasions.  He has had similar concerns related to kidney stone intervention (i.e. booked for stone surgery and then canceled)  He is on chronic aspirin and Plavix, history of coronary artery disease followed by Jeffrey Buchanan.     PMH: Past Medical History:  Diagnosis Date   Anxiety    Arthritis    At risk for sleep apnea    STOP-BANG= 5     SENT TO PCP 09-16-2013   Bladder cancer [facility])    Coronary artery disease CARDIOLOGIST-- DR  Jeffrey Buchanan)   MI  IN 2005--  S/P  PTCA  X2 STENTS   DJD (degenerative joint disease)    GERD (gastroesophageal reflux disease)    Headache    History of adenomatous polyp of colon    S/P  PARTIAL COLECTOMY 2006   History of kidney stones    History of myocardial infarction    2005--  S/P  STENTING X2   Hyperlipidemia    Hypertension    Myocardial infarction [facility])    Neuromuscular disorder ([place])    S/P coronary artery stent placement    2005    Surgical History: Past Surgical History:  Procedure Laterality Date   CARDIOVASCULAR STRESS TEST  2014  dr Jeffrey Buchanan   normal / no ischemia   COLONOSCOPY WITH PROPOFOL N/A 03/04/2017   Procedure: COLONOSCOPY WITH PROPOFOL;  Surgeon: Jeffrey Buchanan;  Location: [facility] ENDOSCOPY;  Service: Gastroenterology;  Laterality: N/A;   CORONARY ANGIOPLASTY WITH STENT PLACEMENT  01/ 2005   [facility]   STENT to RCA   CYSTOSCOPY W/ RETROGRADES Bilateral 09/26/2013   Procedure: CYSTOSCOPY WITH BILATERAL RETROGRADE PYELOGRAM;  Surgeon: Jeffrey Buchanan;  Location: [facility];  Service: Urology;  Laterality: Bilateral;   CYSTOSCOPY WITH LITHOLAPAXY N/A 06/13/2019   Procedure: CYSTOSCOPY;  Surgeon: Jeffrey Buchanan;  Location: [facility] ORS;  Service: Urology;  Laterality: N/A;   ESOPHAGOGASTRODUODENOSCOPY (EGD) WITH PROPOFOL N/A 03/04/2017   Procedure: ESOPHAGOGASTRODUODENOSCOPY (EGD) WITH PROPOFOL;  Surgeon: Jeffrey Buchanan;  Location: [facility]  ENDOSCOPY;  Service: Gastroenterology;  Laterality: N/A;   OPEN REDUCTION SHOULDER DISLOCATION  1976   PARTIAL COLECTOMY  2006   STONE EXTRACTION WITH BASKET N/A 06/13/2019   Procedure: STONE EXTRACTION WITH BASKET;  Surgeon: Jeffrey Buchanan;  Location: [facility] ORS;  Service: Urology;  Laterality: N/A;   TRANSTHORACIC ECHOCARDIOGRAM  12-17-2011  dr Jeffrey Buchanan   mild lv dysfunction/  ef 45%/  moderate lvh/  mild mitral & tricuspid insufficiencey   TRANSURETHRAL RESECTION OF BLADDER TUMOR  2014     [facility]   TRANSURETHRAL RESECTION OF BLADDER TUMOR WITH GYRUS (TURBT-GYRUS) N/A 09/26/2013   Procedure: BLADDER BIOPSY;  Surgeon: Jeffrey Buchanan;  Location: [facility];  Service: Urology;  Laterality: N/A;    Home Medications:  Allergies as of 10/24/2020       Reactions   Cephalosporins Diarrhea   Escitalopram Oxalate Other (See Comments)   Unknown reaction type        Medication List        Accurate as of October 24, 2020 11:59 PM. If you have any questions, ask your nurse or doctor.          ALPRAZolam 0.25 MG tablet Commonly known as: XANAX Take 0.25 mg by mouth in the morning and at bedtime.   aspirin EC 81 MG tablet Take 81 mg by mouth at bedtime.   clopidogrel 75 MG tablet Commonly known as: PLAVIX Take 75 mg by mouth at bedtime.   finasteride 5 MG tablet Commonly known as: PROSCAR Take by mouth.   finasteride 5 MG tablet Commonly known as: PROSCAR TAKE 1 TABLET BY MOUTH EVERY DAY   hydrochlorothiazide 12.5 MG tablet Commonly known as: HYDRODIURIL Take 12.5 mg by mouth at bedtime.   HYDROcodone-acetaminophen 5-325 MG tablet Commonly known as: NORCO/VICODIN Take 1 tablet by mouth 4 (four) times daily as needed (pain.).   ibuprofen 200 MG tablet Commonly known as: ADVIL Take 200-400 mg by mouth every 8 (eight) hours as needed (for plantar fasciitis pain).   lisinopril 10 MG tablet Commonly known as: ZESTRIL Take 10 mg by mouth at bedtime.   lisinopril 10 MG tablet Commonly known as: ZESTRIL Take 1 tablet by mouth daily.   metoprolol succinate 50 MG 24 hr tablet Commonly known as: TOPROL-XL Take 50 mg by mouth every evening. Take with or immediately following a meal.   rosuvastatin 40 MG tablet Commonly known as: CRESTOR Take 40 mg by mouth at bedtime.   tamsulosin 0.4 MG Caps capsule Commonly known as: FLOMAX TAKE 2 CAPSULES (0.8 MG TOTAL) BY MOUTH AT BEDTIME.   triamcinolone cream 0.1 % Commonly known as:  KENALOG Apply 1 application topically 2 (two) times daily as needed (Rash). For itch on right thigh. Avoid applying to face, groin, and axilla. Use as directed.   Vitamin D3 50 MCG (2000 UT) Tabs Take 2,000 Units by mouth at bedtime.        Allergies:  Allergies  Allergen Reactions   Cephalosporins Diarrhea   Escitalopram Oxalate Other (See Comments)    Unknown reaction type    Family History: Family History  Problem Relation Age of Onset   Prostate cancer Brother    Kidney disease Neg Hx     Social History:  reports that he has been smoking cigarettes. He has a 32.00 pack-year smoking history. He has never used smokeless tobacco. He reports that he does not drink alcohol and does not use drugs.   Physical Exam: BP (!) 146/82  Pulse 85   Ht '6\' 2"'$  (1.88 m)   Wt 257 lb (116.6 kg)   BMI 33.00 kg/m   Constitutional:  Alert and oriented, No acute distress. HEENT: [place] AT, moist mucus membranes.  Trachea midline, no masses. Cardiovascular: No clubbing, cyanosis, or edema. Respiratory: Normal respiratory effort, no increased work of breathing. Skin: No rashes, bruises or suspicious lesions. Neurologic: Grossly intact, no focal deficits, moving all 4 extremities. Psychiatric: Normal mood and affect.  Laboratory Data: Lab Results  Component Value Date   WBC 10.6 (H) 10/09/2020   HGB 16.9 10/09/2020   HCT 52.3 (H) 10/09/2020   MCV 93.2 10/09/2020   PLT 177 10/09/2020    Lab Results  Component Value Date   CREATININE 1.02 10/09/2020    Urinalysis Results for orders placed or performed during the Buchanan encounter of 10/10/20  Urine Culture   Specimen: Urine, Random  Result Value Ref Range   Specimen Description      URINE, RANDOM Performed at [facility], [address], [place], [place] [address]    Special Requests      NONE Performed at [facility], [address], [place], [place] [address]    Culture      NO GROWTH Performed at  [facility], [place] [address] [place],  [address]    Report Status 10/11/2020 FINAL   Urinalysis, Complete w Microscopic Urine, Clean Catch  Result Value Ref Range   Color, Urine RED (A) YELLOW   APPearance TURBID (A) CLEAR   Specific Gravity, Urine 1.027 1.005 - 1.030   pH  5.0 - 8.0    TEST NOT REPORTED DUE TO COLOR INTERFERENCE OF URINE PIGMENT   Glucose, UA (A) NEGATIVE mg/dL    TEST NOT REPORTED DUE TO COLOR INTERFERENCE OF URINE PIGMENT   Hgb urine dipstick (A) NEGATIVE    TEST NOT REPORTED DUE TO COLOR INTERFERENCE OF URINE PIGMENT   Bilirubin Urine (A) NEGATIVE    TEST NOT REPORTED DUE TO COLOR INTERFERENCE OF URINE PIGMENT   Ketones, ur (A) NEGATIVE mg/dL    TEST NOT REPORTED DUE TO COLOR INTERFERENCE OF URINE PIGMENT   Protein, ur (A) NEGATIVE mg/dL    TEST NOT REPORTED DUE TO COLOR INTERFERENCE OF URINE PIGMENT   Nitrite (A) NEGATIVE    TEST NOT REPORTED DUE TO COLOR INTERFERENCE OF URINE PIGMENT   Leukocytes,Ua (A) NEGATIVE    TEST NOT REPORTED DUE TO COLOR INTERFERENCE OF URINE PIGMENT  Basic metabolic panel  Result Value Ref Range   Sodium 140 135 - 145 mmol/L   Potassium 4.1 3.5 - 5.1 mmol/L   Chloride 105 98 - 111 mmol/L   CO2 26 22 - 32 mmol/L   Glucose, Bld 105 (H) 70 - 99 mg/dL   BUN 18 8 - 23 mg/dL   Creatinine, Ser 1.02 0.61 - 1.24 mg/dL   Calcium 9.5 8.9 - 10.3 mg/dL   GFR, Estimated >60 >60 mL/min   Anion gap 9 5 - 15  CBC  Result Value Ref Range   WBC 10.6 (H) 4.0 - 10.5 K/uL   RBC 5.61 4.22 - 5.81 MIL/uL   Hemoglobin 16.9 13.0 - 17.0 g/dL   HCT 52.3 (H) 39.0 - 52.0 %   MCV 93.2 80.0 - 100.0 fL   MCH 30.1 26.0 - 34.0 pg   MCHC 32.3 30.0 - 36.0 g/dL   RDW 13.2 11.5 - 15.5 %   Platelets 177 150 - 400 K/uL  nRBC 0.0 0.0 - 0.2 %    Pertinent Imaging: CT Renal Stone Study  Narrative CLINICAL DATA:  Flank pain. Urinary retention. History of kidney stone.  EXAM: CT ABDOMEN AND PELVIS WITHOUT CONTRAST  TECHNIQUE: Multidetector  CT imaging of the abdomen and pelvis was performed following the standard protocol without IV contrast.  COMPARISON:  CT 07/27/2019  FINDINGS: Lower chest: No pleural effusion or acute airspace disease. Coronary artery calcifications, normal heart size.  Hepatobiliary: Diffusely decreased hepatic density typical of steatosis. Stable subcapsular hypodensity in the right lobe, series 2, image 33, likely cyst. Gallbladder physiologically distended, no calcified stone. No biliary dilatation.  Pancreas: No ductal dilatation or inflammation.  Spleen: Upper normal in size spanning 13.2 cm. No focal abnormality.  Adrenals/Urinary Tract: Normal adrenal glands. Bilateral renal calculi. There is a large stone in the right lower pole calyx measuring 2 cm, increased in size from prior exam. Largest stone in the left kidney measures 7 mm in the interpolar region simple cyst in the anterior right kidney is similar to prior exam. The hypoattenuating lesion in the left kidney on prior contrast-enhanced CT is grossly stable but not well characterized. Both ureters are decompressed. There are multiple stones in the urinary bladder. A least 4 stones are seen in the midline dependently, largest measuring 10 mm. There is also a punctate calcification at the left urinary trigone. Urinary bladder is near completely empty with wall thickening and mild perivesicular fat stranding. No urethral stones are seen.  Stomach/Bowel: Decompressed stomach. No small bowel obstruction or inflammation. Normal appendix. The colon is redundant. Chain sutures are noted in the transverse or redundant sigmoid colon difficult to delineate due to tortuosity. There is a 15 mm nodule in the central anterior abdomen that abuts small bowel loops and colon.  Vascular/Lymphatic: Advanced aortic atherosclerosis. Linear calcification in through the central distal aorta consistent with chronic dissection, also seen on prior. No  periaortic stranding. Circumaortic left renal vein. Small periportal lymph nodes are typically reactive.  Reproductive: Enlarged prostate gland spans 6.8 cm transverse.  Other: No free air or ascites. There is a small fat containing supraumbilical ventral abdominal wall hernia without inflammation.  Musculoskeletal: There are no acute or suspicious osseous abnormalities.  IMPRESSION: 1. Bilateral nonobstructing intrarenal calculi. Largest stone in the right lower pole calyx measures 2 cm, increased in size from prior exam. No hydronephrosis or obstructive uropathy. 2. Multiple stones in the urinary bladder, largest measuring 10 mm. There is also a punctate calcification at the left urinary trigone. 3. Urinary bladder is near completely empty with wall thickening and mild perivesicular fat stranding, suggesting cystitis. 4. Enlarged prostate gland. 5. Hepatic steatosis. 6. A 15 mm nodule in the central anterior abdomen abuts small bowel loops and colon. This may represent a lymph node, however is nonspecific. Possibility of short segment bowel wall thickening is also considered. Recommend up-to-date colonoscopy.  Aortic Atherosclerosis (ICD10-I70.0).   Electronically Signed By: Keith Rake M.D. On: 10/09/2020 21:37   Assessment & Plan:    1. Benign prostatic hyperplasia with post-void dribbling Markedly enlarged prostate with recurrent bladder stones consistent with chronic outlet obstruction  Currently on maximal medical therapy with recurrent refractory symptoms and recurrent stone disease  We discussed cystolitholapaxy again today.  As on previous occasions, I do strongly recommend an outlet procedure concomitantly to reduce the risk of recurrent bladder stones.  He is finally open to this.  Based on the size of his prostate, he benefit from HoLEP.  We  discussed alternatives including TURP and other ablative techniques.  Prostate is likely too large for UroLift.  We  reviewed the surgery in detail today including the preoperative, intraoperative, and postoperative course.  This will most likely be an outpatient procedure pending the degree of post op hematuria.  He will go home with catheter for a few days post op and will either be taught how to remove his own catheter or return to the office for catheter removal.  Risk of bleeding, infection, damage surrounding structures, injury to the bladder/ urethral, bladder neck contracture, ureteral stricture, retrograde ejaculation, stress/ urge incontinence, exacerbation of irritative voiding symptoms were all discussed in detail.    - Urinalysis, Complete - CULTURE, URINE COMPREHENSIVE  2. Bladder stones Cystolitholapaxy at the time of HoLEP  3. Kidney stones Large stone burden, currently nonobstructive and asymptomatic  Will discuss further once the previous is treated  4. History of elevated PSA normalized  Patient will need cardiac clearance to hold at minimum his Plavix preop, Dr. Lanier Prude, [place] [address] [place], [place] [address] [phone]

## 2020-10-28 LAB — CULTURE, URINE COMPREHENSIVE

## 2020-10-29 ENCOUNTER — Telehealth: Payer: Self-pay | Admitting: *Deleted

## 2020-10-29 MED ORDER — NITROFURANTOIN MONOHYD MACRO 100 MG PO CAPS
100.0000 mg | ORAL_CAPSULE | Freq: Two times a day (BID) | ORAL | 0 refills | Status: DC
Start: 2020-10-29 — End: 2021-01-21

## 2020-10-29 NOTE — Telephone Encounter (Addendum)
Patient informed, voiced understanding.  Sent in RX to [facility]  ----- Message from Hollice Espy, MD sent at 10/29/2020  7:45 AM EDT ----- This urine culture was positive.  Recommend taking Macrobid twice daily for 10 days.  Hollice Espy, MD

## 2020-11-02 ENCOUNTER — Encounter: Payer: Self-pay | Admitting: Urology

## 2020-11-05 ENCOUNTER — Ambulatory Visit: Payer: BC Managed Care – PPO | Admitting: Dermatology

## 2020-12-25 ENCOUNTER — Ambulatory Visit: Payer: BC Managed Care – PPO | Admitting: Dermatology

## 2021-01-01 ENCOUNTER — Encounter: Payer: Self-pay | Admitting: Urology

## 2021-01-01 ENCOUNTER — Other Ambulatory Visit: Payer: Self-pay

## 2021-01-01 DIAGNOSIS — N3943 Post-void dribbling: Secondary | ICD-10-CM

## 2021-01-01 DIAGNOSIS — N210 Calculus in bladder: Secondary | ICD-10-CM

## 2021-01-01 NOTE — Progress Notes (Signed)
[facility] Posting Form   Surgery Date/Time: Date: 01/28/2021  Surgeon: Dr. Hollice Espy, MD  Surgery Location: Day Surgery  Inpt ( No  )   Outpt (Yes)   Obs ( No  )   Diagnosis: Bladder Stones N21.0; BPH N40.1  -CPT: 56256  Surgery: Holep  -CPT: 38937  Surgery: Cystolitholapaxy  Stop Anticoagulations: Yes  Cardiac/Medical/Pulmonary Clearance needed: Yes  Clearance needed from Dr: Nehemiah Massed  Clearance request sent on: Date: 01/01/21   *Orders entered into EPIC  Date: 01/01/21   *Case booked in EPIC  Date: 01/01/21  *Notified pt of Surgery: Date: 01/01/21  PRE-OP UA & CX: None  *Placed into Prior Authorization [place] Date: 01/01/21   Assistant/laser/rep:No

## 2021-01-01 NOTE — Progress Notes (Signed)
Surgical Physician Order Form  ** Scheduling expectation :  01/28/2021  *Length of Case:   *Clearance needed: yes, Dr. Nehemiah Massed  *Anticoagulation Instructions: Hold all anticoagulants  *Aspirin Instructions: Hold Aspirin and Plavix; unless otherwise specified by cardiology  *Post-op visit Date/Instructions:  1-3 day cath removal; 6 weeks post-op  *Diagnosis: Bladder Stone; BPH  *Procedure: Cysto Litholapaxy <2.5cm (95747) Holep (34037)  -Admit type: OUTpatient  -Anesthesia: General  -VTE Prophylaxis Standing Order SCD's       Other:   -Standing Lab Orders Per Anesthesia    Lab other:  None  -Standing Test orders EKG/Chest x-ray per Anesthesia       Test other:   - Medications:     Ancef 2gm IV   Other Instructions:

## 2021-01-03 ENCOUNTER — Encounter: Payer: Self-pay | Admitting: Urology

## 2021-01-09 ENCOUNTER — Encounter: Payer: Self-pay | Admitting: Urology

## 2021-01-09 ENCOUNTER — Telehealth: Payer: Self-pay | Admitting: Urgent Care

## 2021-01-09 NOTE — Progress Notes (Signed)
  Perioperative Services Pre-Admission/Anesthesia Testing     Date: 01/09/21  Name: Jeffrey Buchanan MRN:   [id number]  Re: Surgical clearance  Surgical clearance received from Dr. Derrick Ravel office (cardiology). Patient has been cleared for the planned [place] MORCELLATION; CYSTOSCOPY WITH LITHOLAPAXY scheduled for 01/28/2021 with Dr. Hollice Espy, MD.  Cardiologist notes that patient may proceed with an overall LOW risk of developing significant perioperative cardiovascular complications. Patient may hold DAPT medications for 3-5 days prior to his procedure with plans to resume 1 day postoperative or when bleeding risk felt to be minimal by surgery. Copy of signed clearance form placed on patient's OR chart for review by the surgical/anesthetic team on the day of his procedure.   Honor Loh, MSN, APRN, FNP-C, CEN [facility]  Peri-operative Services Nurse Practitioner Phone: [phone] 01/09/21 3:15 PM

## 2021-01-17 ENCOUNTER — Telehealth: Payer: Self-pay | Admitting: Urology

## 2021-01-17 NOTE — Telephone Encounter (Signed)
Pt left a voicemail to cancel Holep Procedure on 01/28/21. However he would like to proceed with the Cystolithlapaxy. I informed Dr. Erlene Quan of this decision and the pt. Also sent a mychart message.

## 2021-01-21 ENCOUNTER — Other Ambulatory Visit: Payer: BC Managed Care – PPO

## 2021-01-22 ENCOUNTER — Other Ambulatory Visit: Payer: Self-pay

## 2021-01-22 ENCOUNTER — Encounter
Admission: RE | Admit: 2021-01-22 | Discharge: 2021-01-22 | Disposition: A | Discharge status: Home / Self Care | Payer: BC Managed Care – PPO | Source: Ambulatory Visit | Attending: Urology | Admitting: Urology

## 2021-01-22 ENCOUNTER — Encounter: Payer: Self-pay | Admitting: Urology

## 2021-01-22 NOTE — Addendum Note (Signed)
Addended by: Gerald Leitz A on: 01/22/2021 01:14 PM   Modules accepted: Orders, SmartSet

## 2021-01-22 NOTE — Patient Instructions (Addendum)
Your procedure is scheduled on: Monday, October 24 Report to the Registration Desk on the 1st floor of the [facility]. To find out your arrival time, please call [phone] between 1PM - 3PM on: Friday, October 21  REMEMBER: Instructions that are not followed completely may result in serious medical risk, up to and including death; or upon the discretion of your surgeon and anesthesiologist your surgery may need to be rescheduled.  Do not eat or drink after midnight the night before surgery.  No gum chewing, lozengers or hard candies.  TAKE THESE MEDICATIONS THE MORNING OF SURGERY WITH A SIP OF WATER:  Hydrocodone if needed for pain  Follow recommendations from Cardiologist, Pulmonologist or PCP regarding stopping Aspirin and Plavix. Hold 3-5 days prior to procedure.  One week prior to surgery: Stop Anti-inflammatories (NSAIDS) such as Advil, Aleve, Ibuprofen, Motrin, Naproxen, Naprosyn and Aspirin based products such as Excedrin, Goodys Powder, BC Powder. Stop ANY OVER THE COUNTER supplements until after surgery. You may however, continue to take Tylenol if needed for pain up until the day of surgery.  No Alcohol for 24 hours before or after surgery.  No Smoking including e-cigarettes for 24 hours prior to surgery.  No chewable tobacco products for at least 6 hours prior to surgery.  No nicotine patches on the day of surgery.  Do not use any "recreational" drugs for at least a week prior to your surgery.  Please be advised that the combination of cocaine and anesthesia may have negative outcomes, up to and including death. If you test positive for cocaine, your surgery will be cancelled.  On the morning of surgery brush your teeth with toothpaste and water, you may rinse your mouth with mouthwash if you wish. Do not swallow any toothpaste or mouthwash.  Do not wear jewelry, make-up, hairpins, clips or nail polish.  Do not wear lotions, powders, or perfumes.   Contact  lenses, hearing aids and dentures may not be worn into surgery.  Do not bring valuables to the hospital. [facility] is not responsible for any missing/lost belongings or valuables.   Notify your doctor if there is any change in your medical condition (cold, fever, infection).  Wear comfortable clothing (specific to your surgery type) to the hospital.  If you are being discharged the day of surgery, you will not be allowed to drive home. You will need a responsible adult (18 years or older) to drive you home and stay with you that night.   If you are taking public transportation, you will need to have a responsible adult (18 years or older) with you. Please confirm with your physician that it is acceptable to use public transportation.   Please call the [place] Dept. at [phone] if you have any questions about these instructions.  Surgery Visitation Policy:  Patients undergoing a surgery or procedure may have one family member or support person with them as long as that person is not COVID-19 positive or experiencing its symptoms.  That person may remain in the waiting area during the procedure and may rotate out with other people.

## 2021-01-22 NOTE — Progress Notes (Signed)
Perioperative Services  Pre-Admission/Anesthesia Testing Clinical Review  Date: 01/22/21  Patient Demographics:  Name: Jeffrey Buchanan DOB:   [birth year] MRN:   [id number]  Planned Surgical Procedure(s):    Case: [id number] Date/Time: 01/28/21 0730   Procedure: CYSTOSCOPY WITH LITHOLAPAXY   Anesthesia type: General   Pre-op diagnosis: BPH, Bladder stone   Location: [facility] OR ROOM 10 / [place] [facility]   Surgeons: Hollice Espy, MD     NOTE: Available PAT nursing documentation and vital signs have been reviewed. Clinical nursing staff has updated patient's PMH/PSHx, current medication list, and drug allergies/intolerances to ensure comprehensive history available to assist in medical decision making as it pertains to the aforementioned surgical procedure and anticipated anesthetic course. Extensive review of available clinical information performed. Jeffrey Buchanan PMH and PSHx updated with any diagnoses/procedures that  may have been inadvertently omitted during his intake with the pre-admission testing department's nursing staff.  Clinical Discussion:  Jeffrey Buchanan is a 65 y.o. male who is submitted for pre-surgical anesthesia review and clearance prior to him undergoing the above procedure. Patient is a Current Smoker (32 pack years). Pertinent PMH includes: CAD, MI, HTN, HLD, GERD (no daily Tx), OA, DJD, chronic back pain, urothelial carcinoma, BPH, nephrolithiasis, anxiety (on BZO).  Patient is followed by cardiology Nehemiah Massed, MD). He was last seen in the cardiology clinic on 01/09/2021; notes reviewed.  At the time of his clinic visit, patient doing well overall from a cardiovascular perspective.  He denied any chest pain, shortness breath, PND, orthopnea, significant peripheral edema, palpitations, vertiginous symptoms, or presyncope/syncope.  PMH significant for cardiovascular diagnoses.  Patient suffered an inferior STEMI on 04/06/2003.  He underwent diagnostic left  heart catheterization on 04/10/2003 revealing a reduced LVEF of 40%.  There was multivessel CAD; 90% stenosis of the proximal RCA, 40% stenoses x 2 of the mid RCA, 30 and 20% stenoses of the proximal LCx. Subsequent PCI was performed placing a 3.5 x 18 mm and a 4.0 x 23 mm Vision stent to the RCA.  Exercise stress test performed on 12/26/2020 demonstrated normal exercise tolerance with a normal heart rate response to exercise.  There was no EKG changes consistent with ischemia.  Following stent placement, patient has remained on daily DAPT therapy (ASA + clopidogrel); compliant with therapy with no evidence of GI bleeding.  Blood pressure reasonably controlled at 130/80 on currently prescribed diuretic, ACEi, and beta-blocker therapies.  Patient is on a statin for his HLD.  Patient is not diabetic.  Functional capacity currently limited by orthopedic pain, however patient still felt to be able to achieve at least 4 METS of activity without angina/anginal equivalent symptoms.  No changes were made to his medication regimen.  Patient to follow-up with outpatient cardiology in 6 months or sooner if needed.  Jeffrey Buchanan is scheduled for an CYSTOSCOPY WITH LITHOLAPAXY on 01/28/2021 with Dr. Hollice Espy, MD. Given patient's past medical history significant for cardiovascular diagnoses, presurgical cardiac clearance was sought by the PAT team. Per cardiology, "this patient is optimized for surgery and may proceed with the planned procedural course with a LOW risk of significant perioperative cardiovascular complications".  Again, this patient is on daily DAPT therapy.  He has been instructed on recommendations from cardiology for holding his DAPT medications for 5 days prior to his procedure.  Patient is aware that his last dose of both aspirin and clopidogrel will be on 01/22/2021.  Patient denies previous perioperative complications with anesthesia in the past. In  review of the available records, it is  noted that patient underwent a general anesthetic course here (ASA III) in 06/2019 without documented complications.   Vitals with BMI 01/22/2021 10/24/2020 10/10/2020  Height 6\' 2"  6\' 2"  -  Weight 250 lbs 257 lbs -  BMI 09.38 18.29 -  Systolic - 937 -  Diastolic - 82 -  Pulse - 85 (No Data)    Providers/Specialists:   NOTE: Primary physician provider listed below. Patient may have been seen by APP or partner within same practice.   PROVIDER ROLE / SPECIALTY LAST Lu Duffel, MD UROLOGY (SURGEON) 10/24/2020  [name], MD PRIMARY CARE PROVIDER 01/03/2021  Serafina Royals, MD CARDIOLOGY 01/09/2021   Allergies:  Cephalosporins and Escitalopram oxalate  Current Home Medications:   No current facility-administered medications for this encounter.    ALPRAZolam (XANAX) 0.25 MG tablet   aspirin EC 81 MG tablet   Cholecalciferol (VITAMIN D3) 2000 UNITS TABS   clopidogrel (PLAVIX) 75 MG tablet   finasteride (PROSCAR) 5 MG tablet   hydrochlorothiazide (HYDRODIURIL) 12.5 MG tablet   HYDROcodone-acetaminophen (NORCO/VICODIN) 5-325 MG tablet   ibuprofen (ADVIL) 200 MG tablet   lisinopril (ZESTRIL) 10 MG tablet   metoprolol succinate (TOPROL-XL) 50 MG 24 hr tablet   rosuvastatin (CRESTOR) 40 MG tablet   tamsulosin (FLOMAX) 0.4 MG CAPS capsule   triamcinolone cream (KENALOG) 0.1 %   History:   Past Medical History:  Diagnosis Date   Acute MI, inferior wall ([place]) 04/06/2003   a.) PCI 04/10/2003 --> 90% pRCA --> 3.5 x 18 mm and 4.0 x 23 mm Vision stents to RCA.   Anxiety    Arthritis    BPH (benign prostatic hyperplasia)    Chronic anticoagulation    a.) DAPT therapy (ASA + clopidogrel)   Chronic back pain    Coronary artery disease 04/10/2003   a.) LHC 04/10/2003 --> EF 40%; 90 %pRCA, 40% x 2 mRCA, 30% and 20% pLCx; 3.5 x 18 mm and 4.0 x 23 mm Vision stents to RCA.   DJD (degenerative joint disease)    GERD (gastroesophageal reflux disease)    Headache     History of adenomatous polyp of colon    S/P  PARTIAL COLECTOMY 2006   History of kidney stones    HLD (hyperlipidemia)    Hyperlipidemia    Hypertension    Skin cancer    Urothelial carcinoma of bladder ([place]) 09/14/2012   a.) Bx 09/14/2012 --> pathology (+) for low grade papillary urothelial carcinoma.   Vitamin D deficiency    Past Surgical History:  Procedure Laterality Date   COLONOSCOPY WITH PROPOFOL N/A 03/04/2017   Procedure: COLONOSCOPY WITH PROPOFOL;  Surgeon: Toledo, Benay Pike, MD;  Location: [facility];  Service: Gastroenterology;  Laterality: N/A;   CORONARY ANGIOPLASTY WITH STENT PLACEMENT Left 04/10/2003   Procedure: CORONARY ANGIOPLASTY WITH STENT PLACEMENT (3.5 x 18 mm and 4.0 x 23 mm Vision stents to RCA); Location: [place]; Surgeon: Serafina Royals, MD   CYSTOSCOPY W/ RETROGRADES Bilateral 09/26/2013   Procedure: CYSTOSCOPY WITH BILATERAL RETROGRADE PYELOGRAM;  Surgeon: Sharyn Creamer, MD;  Location: [facility];  Service: Urology;  Laterality: Bilateral;   CYSTOSCOPY WITH LITHOLAPAXY N/A 06/13/2019   Procedure: CYSTOSCOPY;  Surgeon: Hollice Espy, MD;  Location: [facility] ORS;  Service: Urology;  Laterality: N/A;   ESOPHAGOGASTRODUODENOSCOPY (EGD) WITH PROPOFOL N/A 03/04/2017   Procedure: ESOPHAGOGASTRODUODENOSCOPY (EGD) WITH PROPOFOL;  Surgeon: Toledo, Benay Pike, MD;  Location: [facility];  Service:  Gastroenterology;  Laterality: N/A;   OPEN REDUCTION SHOULDER DISLOCATION  04/07/1974   PARTIAL COLECTOMY  04/07/2004   STONE EXTRACTION WITH BASKET N/A 06/13/2019   Procedure: STONE EXTRACTION WITH BASKET;  Surgeon: Hollice Espy, MD;  Location: [facility] ORS;  Service: Urology;  Laterality: N/A;   TRANSURETHRAL RESECTION OF BLADDER TUMOR N/A 2014   TRANSURETHRAL RESECTION OF BLADDER TUMOR WITH GYRUS (TURBT-GYRUS) N/A 09/26/2013   Procedure: BLADDER BIOPSY;  Surgeon: Sharyn Creamer, MD;  [facility];  Service: Urology;   Laterality: N/A;   Family History  Problem Relation Age of Onset   Prostate cancer Brother    Kidney disease Neg Hx    Social History   Tobacco Use   Smoking status: Every Day    Packs/day: 1.00    Years: 32.00    Pack years: 32.00    Types: Cigarettes   Smokeless tobacco: Never  Vaping Use   Vaping Use: Never used  Substance Use Topics   Alcohol use: No   Drug use: No    Pertinent Clinical Results:  LABS: Labs reviewed: Acceptable for surgery.  Lab Results  Component Value Date   WBC 10.6 (H) 10/09/2020   HGB 16.9 10/09/2020   HCT 52.3 (H) 10/09/2020   MCV 93.2 10/09/2020   PLT 177 10/09/2020   Lab Results  Component Value Date   NA 140 10/09/2020   K 4.1 10/09/2020   CO2 26 10/09/2020   GLUCOSE 105 (H) 10/09/2020   BUN 18 10/09/2020   CREATININE 1.02 10/09/2020   CALCIUM 9.5 10/09/2020   GFRNONAA >60 10/09/2020    ECG: Date: 12/03/2020 Rate: 91 bpm Rhythm:  NSR; nonspecific IVCD Axis (leads I and aVF): Normal Intervals: PR 162 ms. QRS 134 ms. QTc 474 ms. ST segment and T wave changes: No evidence of acute ST segment elevation or depression Comparison: Similar to previous tracing obtained on 10/11/2019 NOTE: Tracing obtained at [facility]; unable for review. Above based on cardiologist's interpretation.    IMAGING / PROCEDURES: EXERCISE STRESS TEST performed on 12/26/2020 Normal exercise tolerance Normal heart rate response to exercise No ST or T wave changes noted during stress  LEFT HEART CATHETERIZATION AND CORONARY ANGIOGRAPHY performed on 04/10/2003 Acute inferior STEMI on 04/06/2003 LVEF 40% Multivessel CAD 90% stenosis of the proximal RCA 40% stenoses x 2 of the mid RCA 30% and 20% stenoses of the proximal LCx Successful PCI 3.5 x 18 mm and 4.0 x 23 mm Vision stents to the RCA  Impression and Plan:  Jeffrey Buchanan has been referred for pre-anesthesia review and clearance prior to him undergoing the planned anesthetic and  procedural courses. Available labs, pertinent testing, and imaging results were personally reviewed by me. This patient has been appropriately cleared by cardiology with an overall LOW risk of significant perioperative cardiovascular complications.  Based on clinical review performed today (01/22/21), barring any significant acute changes in the patient's overall condition, it is anticipated that he will be able to proceed with the planned surgical intervention. Any acute changes in clinical condition may necessitate his procedure being postponed and/or cancelled. Patient will meet with anesthesia team (MD and/or CRNA) on the day of his procedure for preoperative evaluation/assessment. Questions regarding anesthetic course will be fielded at that time.   Pre-surgical instructions were reviewed with the patient during his PAT appointment and questions were fielded by PAT clinical staff. Patient was advised that if any questions or concerns arise prior to his procedure then he should return  a call to PAT and/or his surgeon's office to discuss.  Honor Loh, MSN, APRN, FNP-C, CEN [facility]  Peri-operative Services Nurse Practitioner Phone: [phone] Fax: [phone] 01/22/21 3:09 PM  NOTE: This note has been prepared using Dragon dictation software. Despite my best ability to proofread, there is always the potential that unintentional transcriptional errors may still occur from this process.

## 2021-01-28 ENCOUNTER — Ambulatory Visit: Payer: BC Managed Care – PPO | Admitting: Urgent Care

## 2021-01-28 ENCOUNTER — Encounter: Payer: Self-pay | Admitting: Urology

## 2021-01-28 ENCOUNTER — Ambulatory Visit: Payer: BC Managed Care – PPO

## 2021-01-28 ENCOUNTER — Encounter
Admission: RE | Disposition: A | Discharge status: Home / Self Care | Payer: Self-pay | Source: Home / Self Care | Attending: Urology

## 2021-01-28 ENCOUNTER — Ambulatory Visit
Admission: RE | Admit: 2021-01-28 | Discharge: 2021-01-28 | Disposition: A | Discharge status: Home / Self Care | Payer: BC Managed Care – PPO | Attending: Urology | Admitting: Urology

## 2021-01-28 DIAGNOSIS — Z9049 Acquired absence of other specified parts of digestive tract: Secondary | ICD-10-CM | POA: Insufficient documentation

## 2021-01-28 DIAGNOSIS — I2510 Atherosclerotic heart disease of native coronary artery without angina pectoris: Secondary | ICD-10-CM | POA: Diagnosis not present

## 2021-01-28 DIAGNOSIS — Z87442 Personal history of urinary calculi: Secondary | ICD-10-CM | POA: Insufficient documentation

## 2021-01-28 DIAGNOSIS — K219 Gastro-esophageal reflux disease without esophagitis: Secondary | ICD-10-CM | POA: Insufficient documentation

## 2021-01-28 DIAGNOSIS — N401 Enlarged prostate with lower urinary tract symptoms: Secondary | ICD-10-CM

## 2021-01-28 DIAGNOSIS — E785 Hyperlipidemia, unspecified: Secondary | ICD-10-CM | POA: Insufficient documentation

## 2021-01-28 DIAGNOSIS — K760 Fatty (change of) liver, not elsewhere classified: Secondary | ICD-10-CM | POA: Insufficient documentation

## 2021-01-28 DIAGNOSIS — N210 Calculus in bladder: Secondary | ICD-10-CM | POA: Insufficient documentation

## 2021-01-28 DIAGNOSIS — Z955 Presence of coronary angioplasty implant and graft: Secondary | ICD-10-CM | POA: Diagnosis not present

## 2021-01-28 DIAGNOSIS — I700 Atherosclerosis of aorta: Secondary | ICD-10-CM | POA: Diagnosis not present

## 2021-01-28 DIAGNOSIS — E875 Hyperkalemia: Secondary | ICD-10-CM | POA: Insufficient documentation

## 2021-01-28 DIAGNOSIS — I252 Old myocardial infarction: Secondary | ICD-10-CM | POA: Diagnosis not present

## 2021-01-28 DIAGNOSIS — F17210 Nicotine dependence, cigarettes, uncomplicated: Secondary | ICD-10-CM | POA: Diagnosis not present

## 2021-01-28 DIAGNOSIS — N138 Other obstructive and reflux uropathy: Secondary | ICD-10-CM | POA: Insufficient documentation

## 2021-01-28 DIAGNOSIS — N320 Bladder-neck obstruction: Secondary | ICD-10-CM | POA: Insufficient documentation

## 2021-01-28 DIAGNOSIS — N3943 Post-void dribbling: Secondary | ICD-10-CM | POA: Insufficient documentation

## 2021-01-28 DIAGNOSIS — Z7982 Long term (current) use of aspirin: Secondary | ICD-10-CM | POA: Diagnosis not present

## 2021-01-28 DIAGNOSIS — Z881 Allergy status to other antibiotic agents status: Secondary | ICD-10-CM | POA: Insufficient documentation

## 2021-01-28 DIAGNOSIS — Z8551 Personal history of malignant neoplasm of bladder: Secondary | ICD-10-CM | POA: Insufficient documentation

## 2021-01-28 DIAGNOSIS — Z7902 Long term (current) use of antithrombotics/antiplatelets: Secondary | ICD-10-CM | POA: Insufficient documentation

## 2021-01-28 DIAGNOSIS — I10 Essential (primary) hypertension: Secondary | ICD-10-CM | POA: Diagnosis not present

## 2021-01-28 DIAGNOSIS — Z79899 Other long term (current) drug therapy: Secondary | ICD-10-CM | POA: Insufficient documentation

## 2021-01-28 HISTORY — DX: Unspecified malignant neoplasm of skin, unspecified: C44.90

## 2021-01-28 HISTORY — DX: Hyperlipidemia, unspecified: E78.5

## 2021-01-28 HISTORY — DX: Other chronic pain: G89.29

## 2021-01-28 HISTORY — DX: Long term (current) use of anticoagulants: Z79.01

## 2021-01-28 HISTORY — PX: CYSTOSCOPY WITH LITHOLAPAXY: SHX1425

## 2021-01-28 HISTORY — DX: Benign prostatic hyperplasia without lower urinary tract symptoms: N40.0

## 2021-01-28 HISTORY — DX: Vitamin D deficiency, unspecified: E55.9

## 2021-01-28 SURGERY — CYSTOSCOPY, WITH BLADDER CALCULUS LITHOLAPAXY
Anesthesia: General

## 2021-01-28 MED ORDER — PROPOFOL 10 MG/ML IV BOLUS
INTRAVENOUS | Status: AC
  Filled 2021-01-28: qty 20

## 2021-01-28 MED ORDER — FAMOTIDINE 20 MG PO TABS
ORAL_TABLET | ORAL | Status: AC
  Administered 2021-01-28: 20 mg via ORAL
  Filled 2021-01-28: qty 1

## 2021-01-28 MED ORDER — ORAL CARE MOUTH RINSE
15.0000 mL | Freq: Once | OROMUCOSAL | Status: AC
Start: 2021-01-28 — End: 2021-01-28

## 2021-01-28 MED ORDER — ACETAMINOPHEN 10 MG/ML IV SOLN
INTRAVENOUS | Status: DC | PRN
  Administered 2021-01-28: 1000 mg via INTRAVENOUS

## 2021-01-28 MED ORDER — PHENYLEPHRINE HCL (PRESSORS) 10 MG/ML IV SOLN: INTRAVENOUS | Status: DC | PRN

## 2021-01-28 MED ORDER — PROPOFOL 10 MG/ML IV BOLUS
INTRAVENOUS | Status: DC | PRN
  Administered 2021-01-28: 100 mg via INTRAVENOUS

## 2021-01-28 MED ORDER — ONDANSETRON HCL 4 MG/2ML IJ SOLN
INTRAMUSCULAR | Status: AC
  Filled 2021-01-28: qty 2

## 2021-01-28 MED ORDER — OXYCODONE HCL 5 MG PO TABS: 5.0000 mg | ORAL_TABLET | Freq: Once | ORAL | Status: DC | PRN

## 2021-01-28 MED ORDER — MIDAZOLAM HCL 2 MG/2ML IJ SOLN
INTRAMUSCULAR | Status: AC
  Filled 2021-01-28: qty 2

## 2021-01-28 MED ORDER — LIDOCAINE HCL (CARDIAC) PF 100 MG/5ML IV SOSY
PREFILLED_SYRINGE | INTRAVENOUS | Status: DC | PRN
  Administered 2021-01-28: 100 mg via INTRAVENOUS

## 2021-01-28 MED ORDER — SEVOFLURANE IN SOLN
RESPIRATORY_TRACT | Status: AC
  Filled 2021-01-28: qty 250

## 2021-01-28 MED ORDER — ONDANSETRON HCL 4 MG/2ML IJ SOLN
INTRAMUSCULAR | Status: DC | PRN
  Administered 2021-01-28: 4 mg via INTRAVENOUS

## 2021-01-28 MED ORDER — DEXAMETHASONE SODIUM PHOSPHATE 10 MG/ML IJ SOLN
INTRAMUSCULAR | Status: AC
  Filled 2021-01-28: qty 1

## 2021-01-28 MED ORDER — DEXAMETHASONE SODIUM PHOSPHATE 10 MG/ML IJ SOLN
INTRAMUSCULAR | Status: DC | PRN
  Administered 2021-01-28: 10 mg via INTRAVENOUS

## 2021-01-28 MED ORDER — SUCCINYLCHOLINE CHLORIDE 200 MG/10ML IV SOSY
PREFILLED_SYRINGE | INTRAVENOUS | Status: AC
  Filled 2021-01-28: qty 10

## 2021-01-28 MED ORDER — FENTANYL CITRATE (PF) 100 MCG/2ML IJ SOLN
INTRAMUSCULAR | Status: DC | PRN
  Administered 2021-01-28 (×4): 25 ug via INTRAVENOUS

## 2021-01-28 MED ORDER — CHLORHEXIDINE GLUCONATE 0.12 % MT SOLN
OROMUCOSAL | Status: AC
  Administered 2021-01-28: 15 mL via OROMUCOSAL
  Filled 2021-01-28: qty 15

## 2021-01-28 MED ORDER — ACETAMINOPHEN 10 MG/ML IV SOLN
INTRAVENOUS | Status: AC
  Filled 2021-01-28: qty 100

## 2021-01-28 MED ORDER — ONDANSETRON HCL 4 MG/2ML IJ SOLN
4.0000 mg | Freq: Once | INTRAMUSCULAR | Status: AC
  Administered 2021-01-28: 4 mg via INTRAVENOUS

## 2021-01-28 MED ORDER — FENTANYL CITRATE (PF) 100 MCG/2ML IJ SOLN: 25.0000 ug | INTRAMUSCULAR | Status: DC | PRN

## 2021-01-28 MED ORDER — FENTANYL CITRATE (PF) 100 MCG/2ML IJ SOLN
INTRAMUSCULAR | Status: AC
  Filled 2021-01-28: qty 2

## 2021-01-28 MED ORDER — CEFAZOLIN SODIUM-DEXTROSE 2-4 GM/100ML-% IV SOLN
INTRAVENOUS | Status: AC
  Filled 2021-01-28: qty 100

## 2021-01-28 MED ORDER — SODIUM CHLORIDE 0.9 % IR SOLN
Status: DC | PRN
  Administered 2021-01-28: 6000 mL

## 2021-01-28 MED ORDER — LIDOCAINE HCL (PF) 2 % IJ SOLN
INTRAMUSCULAR | Status: AC
  Filled 2021-01-28: qty 5

## 2021-01-28 MED ORDER — FAMOTIDINE 20 MG PO TABS: 20.0000 mg | ORAL_TABLET | Freq: Once | ORAL | Status: AC

## 2021-01-28 MED ORDER — OXYCODONE HCL 5 MG/5ML PO SOLN: 5.0000 mg | Freq: Once | ORAL | Status: DC | PRN

## 2021-01-28 MED ORDER — CEFAZOLIN SODIUM-DEXTROSE 2-4 GM/100ML-% IV SOLN
2.0000 g | INTRAVENOUS | Status: AC
  Administered 2021-01-28: 2 g via INTRAVENOUS

## 2021-01-28 MED ORDER — CHLORHEXIDINE GLUCONATE 0.12 % MT SOLN: 15.0000 mL | Freq: Once | OROMUCOSAL | Status: AC

## 2021-01-28 MED ORDER — PHENYLEPHRINE HCL (PRESSORS) 10 MG/ML IV SOLN
INTRAVENOUS | Status: DC | PRN
  Administered 2021-01-28: 40 ug via INTRAVENOUS
  Administered 2021-01-28 (×2): 80 ug via INTRAVENOUS
  Administered 2021-01-28: 40 ug via INTRAVENOUS

## 2021-01-28 MED ORDER — MIDAZOLAM HCL 2 MG/2ML IJ SOLN
INTRAMUSCULAR | Status: DC | PRN
Start: 2021-01-28 — End: 2021-01-28
  Administered 2021-01-28: 2 mg via INTRAVENOUS

## 2021-01-28 MED ORDER — LACTATED RINGERS IV SOLN: INTRAVENOUS | Status: DC

## 2021-01-28 SURGICAL SUPPLY — 18 items
BAG DRAIN CYSTO-URO LG1000N (MISCELLANEOUS) ×2 IMPLANT
BASKET ZERO TIP 1.9FR (BASKET) ×2 IMPLANT
CATH URETL OPEN 5X70 (CATHETERS) ×2 IMPLANT
FIBER LASER MOSES 550 DFL (Laser) ×2 IMPLANT
GAUZE 4X4 16PLY ~~LOC~~+RFID DBL (SPONGE) ×4 IMPLANT
GLOVE SURG ENC MOIS LTX SZ6.5 (GLOVE) ×2 IMPLANT
GOWN STRL REUS W/ TWL LRG LVL3 (GOWN DISPOSABLE) ×2 IMPLANT
GOWN STRL REUS W/TWL LRG LVL3 (GOWN DISPOSABLE) ×2
KIT TURNOVER CYSTO (KITS) ×2 IMPLANT
MANIFOLD NEPTUNE II (INSTRUMENTS) ×2 IMPLANT
Open ended 5 F ureteral catheter ×2 IMPLANT
PACK CYSTO AR (MISCELLANEOUS) ×2 IMPLANT
SET IRRIG Y TYPE TUR BLADDER L (SET/KITS/TRAYS/PACK) ×2 IMPLANT
SYR TOOMEY IRRIG 70ML (MISCELLANEOUS) ×2
SYRINGE TOOMEY IRRIG 70ML (MISCELLANEOUS) ×1 IMPLANT
WATER STERILE IRR 1000ML POUR (IV SOLUTION) ×2 IMPLANT
WATER STERILE IRR 3000ML UROMA (IV SOLUTION) ×2 IMPLANT
WATER STERILE IRR 500ML POUR (IV SOLUTION) ×2 IMPLANT

## 2021-01-28 NOTE — Anesthesia Procedure Notes (Signed)
Procedure Name: LMA Insertion Date/Time: 01/28/2021 7:53 AM Performed by: Natasha Mead, CRNA Pre-anesthesia Checklist: Patient identified, Emergency Drugs available, Suction available, Patient being monitored and Timeout performed Patient Re-evaluated:Patient Re-evaluated prior to induction Oxygen Delivery Method: Circle system utilized Preoxygenation: Pre-oxygenation with 100% oxygen Induction Type: IV induction Ventilation: Oral airway inserted - appropriate to patient size and Mask ventilation with difficulty LMA: LMA inserted LMA Size: 5.0 Number of attempts: 1 Dental Injury: Teeth and Oropharynx as per pre-operative assessment and Bloody posterior oropharynx

## 2021-01-28 NOTE — Discharge Instructions (Addendum)

## 2021-01-28 NOTE — H&P (Signed)
RRR CTAB  01/28/21  Elected not to peruse HoLEP, cystolithalopaxy only   Jeffrey Buchanan 02-Nov-1955 [id number]   Referring provider: [name], MD [facility] [place],  [place] [address]      Chief Complaint  Patient presents with   Hematuria      HPI: 65 year old male with personal history of kidney stones recurrent bladder stones, underlying BPH who presents today for ER follow-up.  He was seen and evaluated on 10/10/2020 for worsening urinary symptoms including urgency, frequency, gross hematuria as well as dysuria.  He had another noncontrast CT scan indicating recurrent bladder stones, at least 4 measuring up to a centimeter each as well as further progression of his right-sided kidney stones now measuring up to 2 cm but remained nonobstructing.  The bladder wall itself appeared to be thick and with some perivesical fat stranding.  Urinalysis was suspicious but urine culture was ultimately negative.  He continued to have urinary symptoms for at least 5 or 6 days after this ER visit.  He was initially prescribed antibiotics which were poorly tolerated.  After his culture came back negative, he is advised that he could stop these.   Today, he reports that his urinary symptoms are back to his baseline.  He is no longer having hematuria urgency or frequency.   Please see previous notes for details.  He is undergone several bladder stone procedures in the past.  Is counseled to undergo an outlet procedure as well however ultimately elected to forego outlet procedure on previous occasions.  He has had similar concerns related to kidney stone intervention (i.e. booked for stone surgery and then canceled)  He is on chronic aspirin and Plavix, history of coronary artery disease followed by [facility].       PMH:     Past Medical History:  Diagnosis Date   Anxiety     Arthritis     At risk for sleep apnea      STOP-BANG= 5     SENT TO PCP  09-16-2013   Bladder cancer [facility])     Coronary artery disease CARDIOLOGIST-- DR Edyth Gunnels [facility])    MI  IN 2005--  S/P  PTCA  X2 STENTS   DJD (degenerative joint disease)     GERD (gastroesophageal reflux disease)     Headache     History of adenomatous polyp of colon      S/P  PARTIAL COLECTOMY 2006   History of kidney stones     History of myocardial infarction      2005--  S/P  STENTING X2   Hyperlipidemia     Hypertension     Myocardial infarction [facility])     Neuromuscular disorder ([place])     S/P coronary artery stent placement      2005      Surgical History:      Past Surgical History:  Procedure Laterality Date   CARDIOVASCULAR STRESS TEST   2014  dr Nehemiah Massed    normal / no ischemia   COLONOSCOPY WITH PROPOFOL N/A 03/04/2017    Procedure: COLONOSCOPY WITH PROPOFOL;  Surgeon: Toledo, Benay Pike, MD;  Location: [facility];  Service: Gastroenterology;  Laterality: N/A;   CORONARY ANGIOPLASTY WITH STENT PLACEMENT   01/ 2005   [facility]    STENT to RCA   CYSTOSCOPY W/ RETROGRADES Bilateral 09/26/2013    Procedure: CYSTOSCOPY WITH BILATERAL RETROGRADE PYELOGRAM;  Surgeon: Sharyn Creamer, MD;  Location: [facility];  Service: Urology;  Laterality: Bilateral;   CYSTOSCOPY WITH LITHOLAPAXY N/A 06/13/2019    Procedure: CYSTOSCOPY;  Surgeon: Hollice Espy, MD;  Location: [facility];  Service: Urology;  Laterality: N/A;   ESOPHAGOGASTRODUODENOSCOPY (EGD) WITH PROPOFOL N/A 03/04/2017    Procedure: ESOPHAGOGASTRODUODENOSCOPY (EGD) WITH PROPOFOL;  Surgeon: Toledo, Benay Pike, MD;  Location: [facility];  Service: Gastroenterology;  Laterality: N/A;   OPEN REDUCTION SHOULDER DISLOCATION   1976   PARTIAL COLECTOMY   2006   STONE EXTRACTION WITH BASKET N/A 06/13/2019    Procedure: STONE EXTRACTION WITH BASKET;  Surgeon: Hollice Espy, MD;  Location: [facility];  Service: Urology;  Laterality: N/A;   TRANSTHORACIC ECHOCARDIOGRAM   12-17-2011  dr Nehemiah Massed    mild lv  dysfunction/  ef 45%/  moderate lvh/  mild mitral & tricuspid insufficiencey   TRANSURETHRAL RESECTION OF BLADDER TUMOR   2014    [facility]   TRANSURETHRAL RESECTION OF BLADDER TUMOR WITH GYRUS (TURBT-GYRUS) N/A 09/26/2013    Procedure: BLADDER BIOPSY;  Surgeon: Sharyn Creamer, MD;  [facility];  Service: Urology;  Laterality: N/A;      Home Medications:  Allergies as of 10/24/2020         Reactions    Cephalosporins Diarrhea    Escitalopram Oxalate Other (See Comments)    Unknown reaction type            Medication List           Accurate as of October 24, 2020 11:59 PM. If you have any questions, ask your nurse or doctor.              ALPRAZolam 0.25 MG tablet Commonly known as: XANAX Take 0.25 mg by mouth in the morning and at bedtime.    aspirin EC 81 MG tablet Take 81 mg by mouth at bedtime.    clopidogrel 75 MG tablet Commonly known as: PLAVIX Take 75 mg by mouth at bedtime.    finasteride 5 MG tablet Commonly known as: PROSCAR Take by mouth.    finasteride 5 MG tablet Commonly known as: PROSCAR TAKE 1 TABLET BY MOUTH EVERY DAY    hydrochlorothiazide 12.5 MG tablet Commonly known as: HYDRODIURIL Take 12.5 mg by mouth at bedtime.    HYDROcodone-acetaminophen 5-325 MG tablet Commonly known as: NORCO/VICODIN Take 1 tablet by mouth 4 (four) times daily as needed (pain.).    ibuprofen 200 MG tablet Commonly known as: ADVIL Take 200-400 mg by mouth every 8 (eight) hours as needed (for plantar fasciitis pain).    lisinopril 10 MG tablet Commonly known as: ZESTRIL Take 10 mg by mouth at bedtime.    lisinopril 10 MG tablet Commonly known as: ZESTRIL Take 1 tablet by mouth daily.    metoprolol succinate 50 MG 24 hr tablet Commonly known as: TOPROL-XL Take 50 mg by mouth every evening. Take with or immediately following a meal.    rosuvastatin 40 MG tablet Commonly known as: CRESTOR Take 40 mg by mouth at bedtime.    tamsulosin  0.4 MG Caps capsule Commonly known as: FLOMAX TAKE 2 CAPSULES (0.8 MG TOTAL) BY MOUTH AT BEDTIME.    triamcinolone cream 0.1 % Commonly known as: KENALOG Apply 1 application topically 2 (two) times daily as needed (Rash). For itch on right thigh. Avoid applying to face, groin, and axilla. Use as directed.    Vitamin D3 50 MCG (2000 UT) Tabs Take 2,000 Units by mouth at bedtime.  Allergies:       Allergies  Allergen Reactions   Cephalosporins Diarrhea   Escitalopram Oxalate Other (See Comments)      Unknown reaction type      Family History:      Family History  Problem Relation Age of Onset   Prostate cancer Brother     Kidney disease Neg Hx        Social History:  reports that he has been smoking cigarettes. He has a 32.00 pack-year smoking history. He has never used smokeless tobacco. He reports that he does not drink alcohol and does not use drugs.     Physical Exam: BP (!) 146/82   Pulse 85   Ht 6\' 2"  (1.88 m)   Wt 257 lb (116.6 kg)   BMI 33.00 kg/m   Constitutional:  Alert and oriented, No acute distress. HEENT: [place] AT, moist mucus membranes.  Trachea midline, no masses. Cardiovascular: No clubbing, cyanosis, or edema. Respiratory: Normal respiratory effort, no increased work of breathing. Skin: No rashes, bruises or suspicious lesions. Neurologic: Grossly intact, no focal deficits, moving all 4 extremities. Psychiatric: Normal mood and affect.   Laboratory Data: Recent Labs       Lab Results  Component Value Date    WBC 10.6 (H) 10/09/2020    HGB 16.9 10/09/2020    HCT 52.3 (H) 10/09/2020    MCV 93.2 10/09/2020    PLT 177 10/09/2020        Recent Labs       Lab Results  Component Value Date    CREATININE 1.02 10/09/2020        Urinalysis       Results for orders placed or performed during the hospital encounter of 10/10/20  Urine Culture    Specimen: Urine, Random  Result Value Ref Range    Specimen Description           URINE, RANDOM Performed at [facility], [address] [place], [place] [address]      Special Requests          NONE Performed at [facility], [address] [place], [place] [address]      Culture          NO GROWTH Performed at [facility], [place] [address], [place],  [address]      Report Status 10/11/2020 FINAL    Urinalysis, Complete w Microscopic Urine, Clean Catch  Result Value Ref Range    Color, Urine RED (A) YELLOW    APPearance TURBID (A) CLEAR    Specific Gravity, Urine 1.027 1.005 - 1.030    pH   5.0 - 8.0      TEST NOT REPORTED DUE TO COLOR INTERFERENCE OF URINE PIGMENT    Glucose, UA (A) NEGATIVE mg/dL      TEST NOT REPORTED DUE TO COLOR INTERFERENCE OF URINE PIGMENT    Hgb urine dipstick (A) NEGATIVE      TEST NOT REPORTED DUE TO COLOR INTERFERENCE OF URINE PIGMENT    Bilirubin Urine (A) NEGATIVE      TEST NOT REPORTED DUE TO COLOR INTERFERENCE OF URINE PIGMENT    Ketones, ur (A) NEGATIVE mg/dL      TEST NOT REPORTED DUE TO COLOR INTERFERENCE OF URINE PIGMENT    Protein, ur (A) NEGATIVE mg/dL      TEST NOT REPORTED DUE TO COLOR INTERFERENCE OF URINE PIGMENT    Nitrite (A) NEGATIVE      TEST  NOT REPORTED DUE TO COLOR INTERFERENCE OF URINE PIGMENT    Leukocytes,Ua (A) NEGATIVE      TEST NOT REPORTED DUE TO COLOR INTERFERENCE OF URINE PIGMENT  Basic metabolic panel  Result Value Ref Range    Sodium 140 135 - 145 mmol/L    Potassium 4.1 3.5 - 5.1 mmol/L    Chloride 105 98 - 111 mmol/L    CO2 26 22 - 32 mmol/L    Glucose, Bld 105 (H) 70 - 99 mg/dL    BUN 18 8 - 23 mg/dL    Creatinine, Ser 1.02 0.61 - 1.24 mg/dL    Calcium 9.5 8.9 - 10.3 mg/dL    GFR, Estimated >60 >60 mL/min    Anion gap 9 5 - 15  CBC  Result Value Ref Range    WBC 10.6 (H) 4.0 - 10.5 K/uL    RBC 5.61 4.22 - 5.81 MIL/uL    Hemoglobin 16.9 13.0 - 17.0 g/dL    HCT 52.3 (H) 39.0 - 52.0 %    MCV 93.2 80.0 - 100.0 fL    MCH 30.1 26.0 - 34.0 pg    MCHC  32.3 30.0 - 36.0 g/dL    RDW 13.2 11.5 - 15.5 %    Platelets 177 150 - 400 K/uL    nRBC 0.0 0.0 - 0.2 %    Pertinent Imaging: CT Renal Stone Study   Narrative CLINICAL DATA:  Flank pain. Urinary retention. History of kidney stone.   EXAM: CT ABDOMEN AND PELVIS WITHOUT CONTRAST   TECHNIQUE: Multidetector CT imaging of the abdomen and pelvis was performed following the standard protocol without IV contrast.   COMPARISON:  CT 07/27/2019   FINDINGS: Lower chest: No pleural effusion or acute airspace disease. Coronary artery calcifications, normal heart size.   Hepatobiliary: Diffusely decreased hepatic density typical of steatosis. Stable subcapsular hypodensity in the right lobe, series 2, image 33, likely cyst. Gallbladder physiologically distended, no calcified stone. No biliary dilatation.   Pancreas: No ductal dilatation or inflammation.   Spleen: Upper normal in size spanning 13.2 cm. No focal abnormality.   Adrenals/Urinary Tract: Normal adrenal glands. Bilateral renal calculi. There is a large stone in the right lower pole calyx measuring 2 cm, increased in size from prior exam. Largest stone in the left kidney measures 7 mm in the interpolar region simple cyst in the anterior right kidney is similar to prior exam. The hypoattenuating lesion in the left kidney on prior contrast-enhanced CT is grossly stable but not well characterized. Both ureters are decompressed. There are multiple stones in the urinary bladder. A least 4 stones are seen in the midline dependently, largest measuring 10 mm. There is also a punctate calcification at the left urinary trigone. Urinary bladder is near completely empty with wall thickening and mild perivesicular fat stranding. No urethral stones are seen.   Stomach/Bowel: Decompressed stomach. No small bowel obstruction or inflammation. Normal appendix. The colon is redundant. Chain sutures are noted in the transverse or redundant  sigmoid colon difficult to delineate due to tortuosity. There is a 15 mm nodule in the central anterior abdomen that abuts small bowel loops and colon.   Vascular/Lymphatic: Advanced aortic atherosclerosis. Linear calcification in through the central distal aorta consistent with chronic dissection, also seen on prior. No periaortic stranding. Circumaortic left renal vein. Small periportal lymph nodes are typically reactive.   Reproductive: Enlarged prostate gland spans 6.8 cm transverse.   Other: No free air or ascites. There is a small  fat containing supraumbilical ventral abdominal wall hernia without inflammation.   Musculoskeletal: There are no acute or suspicious osseous abnormalities.   IMPRESSION: 1. Bilateral nonobstructing intrarenal calculi. Largest stone in the right lower pole calyx measures 2 cm, increased in size from prior exam. No hydronephrosis or obstructive uropathy. 2. Multiple stones in the urinary bladder, largest measuring 10 mm. There is also a punctate calcification at the left urinary trigone. 3. Urinary bladder is near completely empty with wall thickening and mild perivesicular fat stranding, suggesting cystitis. 4. Enlarged prostate gland. 5. Hepatic steatosis. 6. A 15 mm nodule in the central anterior abdomen abuts small bowel loops and colon. This may represent a lymph node, however is nonspecific. Possibility of short segment bowel wall thickening is also considered. Recommend up-to-date colonoscopy.   Aortic Atherosclerosis (ICD10-I70.0).     Electronically Signed By: Keith Rake M.D. On: 10/09/2020 21:37     Assessment & Plan:     1. Benign prostatic hyperplasia with post-void dribbling Markedly enlarged prostate with recurrent bladder stones consistent with chronic outlet obstruction  Currently on maximal medical therapy with recurrent refractory symptoms and recurrent stone disease  We discussed cystolitholapaxy again today.  As  on previous occasions, I do strongly recommend an outlet procedure concomitantly to reduce the risk of recurrent bladder stones.  He is finally open to this.  Based on the size of his prostate, he benefit from HoLEP.  We discussed alternatives including TURP and other ablative techniques.  Prostate is likely too large for UroLift.  We reviewed the surgery in detail today including the preoperative, intraoperative, and postoperative course.  This will most likely be an outpatient procedure pending the degree of post op hematuria.  He will go home with catheter for a few days post op and will either be taught how to remove his own catheter or return to the office for catheter removal.   Risk of bleeding, infection, damage surrounding structures, injury to the bladder/ urethral, bladder neck contracture, ureteral stricture, retrograde ejaculation, stress/ urge incontinence, exacerbation of irritative voiding symptoms were all discussed in detail.    - Urinalysis, Complete - CULTURE, URINE COMPREHENSIVE   2. Bladder stones Cystolitholapaxy at the time of HoLEP   3. Kidney stones Large stone burden, currently nonobstructive and asymptomatic  Will discuss further once the previous is treated   4. History of elevated PSA normalized   Patient will need cardiac clearance to hold at minimum his Plavix preop, Dr. Lanier Prude, [place] [address] [place], [place] [address] [phone]

## 2021-01-28 NOTE — Anesthesia Postprocedure Evaluation (Addendum)
Anesthesia Post Note  Patient: Jeffrey Buchanan  Procedure(s) Performed: CYSTOSCOPY WITH LITHOLAPAXY  Patient location during evaluation: PACU Anesthesia Type: General Level of consciousness: awake and alert Pain management: pain level controlled Vital Signs Assessment: post-procedure vital signs reviewed and stable Respiratory status: spontaneous breathing, nonlabored ventilation and respiratory function stable Cardiovascular status: blood pressure returned to baseline and stable Postop Assessment: no apparent nausea or vomiting Anesthetic complications: no   No notable events documented.   Last Vitals:  Vitals:   01/28/21 0900 01/28/21 0918  BP: 114/77 130/79  Pulse: 80 78  Resp: (!) 22 20  Temp: (!) 36.2 C (!) 36.2 C  SpO2: 96% 96%    Last Pain:  Vitals:   01/28/21 0918  TempSrc: Temporal  PainSc:                  [place] Ouch

## 2021-01-28 NOTE — Anesthesia Preprocedure Evaluation (Signed)
Anesthesia Evaluation  Patient identified by MRN, date of birth, ID band Patient awake    Reviewed: Allergy & Precautions, NPO status , Patient's Chart, lab work & pertinent test results  History of Anesthesia Complications Negative for: history of anesthetic complications  Airway Mallampati: III  TM Distance: >3 FB Neck ROM: full    Dental  (+) Chipped, Poor Dentition, Missing   Pulmonary Current Smoker and Patient abstained from smoking.,    Pulmonary exam normal        Cardiovascular Exercise Tolerance: Good hypertension, + CAD, + Past MI and + Cardiac Stents  Normal cardiovascular exam     Neuro/Psych  Headaches, PSYCHIATRIC DISORDERS    GI/Hepatic Neg liver ROS, GERD  Medicated and Controlled,  Endo/Other  negative endocrine ROS  Renal/GU      Musculoskeletal   Abdominal   Peds  Hematology negative hematology ROS (+)   Anesthesia Other Findings Past Medical History: 04/06/2003: Acute MI, inferior wall (HCC)     Comment:  a.) PCI 04/10/2003 --> 90% pRCA --> 3.5 x 18 mm and 4.0               x 23 mm Vision stents to RCA. No date: Anxiety No date: Arthritis No date: BPH (benign prostatic hyperplasia) No date: Chronic anticoagulation     Comment:  a.) DAPT therapy (ASA + clopidogrel) No date: Chronic back pain 04/10/2003: Coronary artery disease     Comment:  a.) LHC 04/10/2003 --> EF 40%; 90% pRCA, 40% x 2 mRCA,               30% and 20% pLCx; 3.5 x 18 mm and 4.0 x 23 mm Vision               stents to RCA. No date: DJD (degenerative joint disease) No date: GERD (gastroesophageal reflux disease) No date: Headache No date: History of adenomatous polyp of colon     Comment:  S/P  PARTIAL COLECTOMY 2006 No date: History of kidney stones No date: HLD (hyperlipidemia) No date: Hyperlipidemia No date: Hypertension No date: Skin cancer 09/14/2012: Urothelial carcinoma of bladder ([place])     Comment:  a.) Bx  09/14/2012 --> pathology (+) for low grade               papillary urothelial carcinoma. No date: Vitamin D deficiency  Past Surgical History: 03/04/2017: COLONOSCOPY WITH PROPOFOL; N/A     Comment:  Procedure: COLONOSCOPY WITH PROPOFOL;  Surgeon: Toledo,               Benay Pike, MD;  Location: [facility];  Service:               Gastroenterology;  Laterality: N/A; 04/10/2003: CORONARY ANGIOPLASTY WITH STENT PLACEMENT; Left     Comment:  Procedure: CORONARY ANGIOPLASTY WITH STENT PLACEMENT               (3.5 x 18 mm and 4.0 x 23 mm Vision stents to RCA);               Location: [place]; Surgeon: Serafina Royals, MD 09/26/2013: Consuela Mimes W/ RETROGRADES; Bilateral     Comment:  Procedure: CYSTOSCOPY WITH BILATERAL RETROGRADE               PYELOGRAM;  Surgeon: Sharyn Creamer, MD;  [facility]               [facility];  Service: Urology;  Laterality: Bilateral; 06/13/2019: CYSTOSCOPY WITH LITHOLAPAXY; N/A     Comment:  Procedure: CYSTOSCOPY;  Surgeon: Hollice Espy, MD;                Location: [facility];  Service: Urology;  Laterality: N/A; 03/04/2017: ESOPHAGOGASTRODUODENOSCOPY (EGD) WITH PROPOFOL; N/A     Comment:  Procedure: ESOPHAGOGASTRODUODENOSCOPY (EGD) WITH               PROPOFOL;  Surgeon: Toledo, Benay Pike, MD;  Location:               [facility];  Service: Gastroenterology;  Laterality:               N/A; 04/07/1974: OPEN REDUCTION SHOULDER DISLOCATION 04/07/2004: PARTIAL COLECTOMY 06/13/2019: STONE EXTRACTION WITH BASKET; N/A     Comment:  Procedure: STONE EXTRACTION WITH BASKET;  Surgeon:               Hollice Espy, MD;  Location: [facility];  Service:               Urology;  Laterality: N/A; 2014: TRANSURETHRAL RESECTION OF BLADDER TUMOR; N/A 09/26/2013: TRANSURETHRAL RESECTION OF BLADDER TUMOR WITH GYRUS  (TURBT-GYRUS); N/A     Comment:  Procedure: BLADDER BIOPSY;  Surgeon: Sharyn Creamer,               MD;  Location: [facility];  Service:               Urology;  Laterality: N/A;  BMI    Body Mass Index: 32.10 kg/m      Reproductive/Obstetrics negative OB ROS                             Anesthesia Physical Anesthesia Plan  ASA: 3  Anesthesia Plan: General LMA   Post-op Pain Management:    Induction: Intravenous  PONV Risk Score and Plan: Dexamethasone, Ondansetron, Midazolam and Treatment may vary due to age or medical condition  Airway Management Planned: LMA  Additional Equipment:   Intra-op Plan:   Post-operative Plan: Extubation in OR  Informed Consent: I have reviewed the patients History and Physical, chart, labs and discussed the procedure including the risks, benefits and alternatives for the proposed anesthesia with the patient or authorized representative who has indicated his/her understanding and acceptance.     Dental Advisory Given  Plan Discussed with: Anesthesiologist, CRNA and Surgeon  Anesthesia Plan Comments: (Patient consented for risks of anesthesia including but not limited to:  - adverse reactions to medications - damage to eyes, teeth, lips or other oral mucosa - nerve damage due to positioning  - sore throat or hoarseness - Damage to heart, brain, nerves, lungs, other parts of body or loss of life  Patient voiced understanding.)        Anesthesia Quick Evaluation

## 2021-01-28 NOTE — Transfer of Care (Signed)
Immediate Anesthesia Transfer of Care Note  Patient: Jeffrey Buchanan  Procedure(s) Performed: CYSTOSCOPY WITH LITHOLAPAXY  Patient Location: PACU  Anesthesia Type:General  Level of Consciousness: awake, alert  and oriented  Airway & Oxygen Therapy: Patient Spontanous Breathing and Patient connected to face mask oxygen  Post-op Assessment: Report given to RN and Post -op Vital signs reviewed and stable  Post vital signs: stable  Last Vitals:  Vitals Value Taken Time  BP    Temp    Pulse 87 01/28/21 0833  Resp 20 01/28/21 0833  SpO2 98 % 01/28/21 0833  Vitals shown include unvalidated device data.  Last Pain:  Vitals:   01/28/21 0643  TempSrc: Temporal  PainSc: 0-No pain         Complications: No notable events documented.

## 2021-01-28 NOTE — Op Note (Signed)
Date of procedure: 01/28/21  Preoperative diagnosis:  BPH with bladder outlet obstruction Bladder stones    Postoperative diagnosis:  Same as above  Procedure: Cystolitholapaxy  Surgeon: Hollice Espy, MD  Anesthesia: General  Complications: None  Intraoperative findings: Massive prostamegaly with bilobar coaptation elevated bladder neck.  Moderate to severely trabeculated bladder with multiple bladder stones.  4 to 5 stones in the bladder up to 1 cm.  Unable to identify trigone due to bladder neck elevation.  EBL: Minimal  Specimens: None  Drains: None  Indication: Jeffrey Buchanan is a 65 y.o. patient with nephrolithiasis, bladder outlet obstruction with prostamegaly and recurrent bladder stones.  Initially, he was counseled to undergo HoLEP with cystolitholapaxy and ultimately canceled the prostate portion of the procedure opting to only treat his bladder stones understanding that he will likely have recurrent stones.  After reviewing the management options for treatment, he elected to proceed with the above surgical procedure(s). We have discussed the potential benefits and risks of the procedure, side effects of the proposed treatment, the likelihood of the patient achieving the goals of the procedure, and any potential problems that might occur during the procedure or recuperation. Informed consent has been obtained.  Description of procedure:  The patient was taken to the operating room and general anesthesia was induced.  The patient was placed in the dorsal lithotomy position, prepped and draped in the usual sterile fashion, and preoperative antibiotics were administered. A preoperative time-out was performed.   A 26 French resectoscope using a blunt angled obturator was advanced into the bladder.  Notably, he had significant trilobar coaptation with elevated bladder neck and moderate to severe bladder trabeculation.  On visual inspection, there were at least 4 5 bladder  stones, measuring up to 1 cm each.  These were too large to be irrigated.  A 550 m laser fiber then was brought in and using dusting settings of 0.3 J and 80 Hz, there were fragmented into smaller pieces.  Some of these passed into the prostatic fossa but ultimately were able to be irrigated such that the entire prostatic fossa and bladder were free of bladder stones and any remaining debris.  There is no significant bleeding.  I did try to inspect the UO unsuccessfully due to distortion from elevated bladder neck.  The bladder was drained.  The patient was then repositioned in the supine position, reversed from anesthesia and taken to PACU in stable condition.  Plan: We will have him follow-up in 6 weeks with IPSS PVR.  Hollice Espy, M.D.

## 2021-01-29 ENCOUNTER — Encounter: Payer: Self-pay | Admitting: Urology

## 2021-01-30 ENCOUNTER — Encounter: Payer: Self-pay | Admitting: Urology

## 2021-01-30 ENCOUNTER — Ambulatory Visit: Payer: BC Managed Care – PPO | Admitting: Urology

## 2021-02-15 ENCOUNTER — Other Ambulatory Visit: Payer: Self-pay | Admitting: Urology

## 2021-02-15 DIAGNOSIS — N3943 Post-void dribbling: Secondary | ICD-10-CM

## 2021-03-10 NOTE — Progress Notes (Incomplete)
03/10/21 7:57 PM   Jeffrey Buchanan [birth year] [id number]  Referring provider:  [name], MD [facility] [place],  [place] [address] No chief complaint on file.    HPI: Jeffrey Buchanan is a 65 y.o.male with a personal history of kidney stones, recurrent bladder stones, and BPH, who presents today for 6 week post-op follow-up with IPSS and PVR.   10/10/2020 noncontrast CT revealed well as dysuria.  He had another noncontrast CT scan indicating recurrent bladder stones, at least 4 measuring up to a centimeter each as well as further progression of his right-sided kidney stones now measuring up to 2 cm but remained nonobstructing.  The bladder wall itself appeared to be thick and with some perivesical fat stranding.  He underwent cystolitholaplaxy on 01/28/2021 for bladder stones. Intraoperative findings showed massive prostamegaly with bilobar coaptation elevated bladder neck.  Moderate to severely trabeculated bladder with multiple bladder stones.  4 to 5 stones in the bladder up to 1 cm.  Unable to identify trigone due to bladder neck elevation.        PMH: Past Medical History:  Diagnosis Date   Acute MI, inferior wall ([place]) 04/06/2003   a.) PCI 04/10/2003 --> 90% pRCA --> 3.5 x 18 mm and 4.0 x 23 mm Vision stents to RCA.   Anxiety    Arthritis    BPH (benign prostatic hyperplasia)    Chronic anticoagulation    a.) DAPT therapy (ASA + clopidogrel)   Chronic back pain    Coronary artery disease 04/10/2003   a.) LHC 04/10/2003 --> EF 40%; 90% pRCA, 40% x 2 mRCA, 30% and 20% pLCx; 3.5 x 18 mm and 4.0 x 23 mm Vision stents to RCA.   DJD (degenerative joint disease)    GERD (gastroesophageal reflux disease)    Headache    History of adenomatous polyp of colon    S/P  PARTIAL COLECTOMY 2006   History of kidney stones    HLD (hyperlipidemia)    Hyperlipidemia    Hypertension    Skin cancer    Urothelial carcinoma of bladder ([place])  09/14/2012   a.) Bx 09/14/2012 --> pathology (+) for low grade papillary urothelial carcinoma.   Vitamin D deficiency     Surgical History: Past Surgical History:  Procedure Laterality Date   COLONOSCOPY WITH PROPOFOL N/A 03/04/2017   Procedure: COLONOSCOPY WITH PROPOFOL;  Surgeon: Buchanan, Jeffrey Pike, MD;  Location: [facility];  Service: Gastroenterology;  Laterality: N/A;   CORONARY ANGIOPLASTY WITH STENT PLACEMENT Left 04/10/2003   Procedure: CORONARY ANGIOPLASTY WITH STENT PLACEMENT (3.5 x 18 mm and 4.0 x 23 mm Vision stents to RCA); Location: [place]; Surgeon: Jeffrey Royals, MD   CYSTOSCOPY W/ RETROGRADES Bilateral 09/26/2013   Procedure: CYSTOSCOPY WITH BILATERAL RETROGRADE PYELOGRAM;  Surgeon: Jeffrey Creamer, MD;  Location: [facility];  Service: Urology;  Laterality: Bilateral;   CYSTOSCOPY WITH LITHOLAPAXY N/A 06/13/2019   Procedure: CYSTOSCOPY;  Surgeon: Jeffrey Espy, MD;  Location: [facility];  Service: Urology;  Laterality: N/A;   CYSTOSCOPY WITH LITHOLAPAXY N/A 01/28/2021   Procedure: CYSTOSCOPY WITH LITHOLAPAXY;  Surgeon: Jeffrey Espy, MD;  Location: [facility];  Service: Urology;  Laterality: N/A;   ESOPHAGOGASTRODUODENOSCOPY (EGD) WITH PROPOFOL N/A 03/04/2017   Procedure: ESOPHAGOGASTRODUODENOSCOPY (EGD) WITH PROPOFOL;  Surgeon: Buchanan, Jeffrey Pike, MD;  Location: [facility];  Service: Gastroenterology;  Laterality: N/A;   OPEN REDUCTION SHOULDER DISLOCATION  04/07/1974   PARTIAL COLECTOMY  04/07/2004  STONE EXTRACTION WITH BASKET N/A 06/13/2019   Procedure: STONE EXTRACTION WITH BASKET;  Surgeon: Jeffrey Espy, MD;  Location: [facility];  Service: Urology;  Laterality: N/A;   TRANSURETHRAL RESECTION OF BLADDER TUMOR N/A 2014   TRANSURETHRAL RESECTION OF BLADDER TUMOR WITH GYRUS (TURBT-GYRUS) N/A 09/26/2013   Procedure: BLADDER BIOPSY;  Surgeon: Jeffrey Creamer, MD;  Location: [facility];  Service: Urology;  Laterality: N/A;     Home Medications:  Allergies as of 03/12/2021       Reactions   Cephalosporins Diarrhea   Escitalopram Oxalate Other (See Comments)   lethargic        Medication List        Accurate as of March 10, 2021  7:57 PM. If you have any questions, ask your nurse or doctor.          ALPRAZolam 0.25 MG tablet Commonly known as: XANAX Take 0.25 mg by mouth in the morning and at bedtime.   aspirin EC 81 MG tablet Take 81 mg by mouth at bedtime.   clopidogrel 75 MG tablet Commonly known as: PLAVIX Take 75 mg by mouth at bedtime.   finasteride 5 MG tablet Commonly known as: PROSCAR TAKE 1 TABLET BY MOUTH EVERY DAY   hydrochlorothiazide 12.5 MG tablet Commonly known as: HYDRODIURIL Take 12.5 mg by mouth at bedtime.   HYDROcodone-acetaminophen 5-325 MG tablet Commonly known as: NORCO/VICODIN Take 1 tablet by mouth 4 (four) times daily as needed (pain.).   ibuprofen 200 MG tablet Commonly known as: ADVIL Take 200 mg by mouth daily.   lisinopril 10 MG tablet Commonly known as: ZESTRIL Take 10 mg by mouth at bedtime.   metoprolol succinate 50 MG 24 hr tablet Commonly known as: TOPROL-XL Take 50 mg by mouth every evening. Take with or immediately following a meal.   rosuvastatin 40 MG tablet Commonly known as: CRESTOR Take 40 mg by mouth at bedtime.   tamsulosin 0.4 MG Caps capsule Commonly known as: FLOMAX TAKE 2 CAPSULES BY MOUTH AT BEDTIME.   triamcinolone cream 0.1 % Commonly known as: KENALOG Apply 1 application topically 2 (two) times daily as needed (Rash). For itch on right thigh. Avoid applying to face, groin, and axilla. Use as directed.   Vitamin D3 50 MCG (2000 UT) Tabs Take 2,000 Units by mouth daily.        Allergies:  Allergies  Allergen Reactions   Cephalosporins Diarrhea   Escitalopram Oxalate Other (See Comments)    lethargic    Family History: Family History  Problem Relation Age of Onset   Prostate cancer Brother    Kidney  disease Neg Hx     Social History:  reports that he has been smoking cigarettes. He has a 32.00 pack-year smoking history. He has never used smokeless tobacco. He reports that he does not drink alcohol and does not use drugs.   Physical Exam: There were no vitals taken for this visit.  Constitutional:  Alert and oriented, No acute distress. HEENT:  AT, moist mucus membranes.  Trachea midline, no masses. Cardiovascular: No clubbing, cyanosis, or edema. Respiratory: Normal respiratory effort, no increased work of breathing. Skin: No rashes, bruises or suspicious lesions. Neurologic: Grossly intact, no focal deficits, moving all 4 extremities. Psychiatric: Normal mood and affect.  Laboratory Data:  Lab Results  Component Value Date   CREATININE 1.02 10/09/2020     Urinalysis   Pertinent Imaging: PVR***   Assessment & Plan:     No follow-ups on  file.  Altamese Saco as a scribe for Jeffrey Espy, MD.,have documented all relevant documentation on the behalf of Jeffrey Espy, MD,as directed by  Jeffrey Espy, MD while in the presence of Jeffrey Buchanan, [place] [address] [place], [place] [address] [phone]

## 2021-03-12 ENCOUNTER — Encounter: Payer: BC Managed Care – PPO | Admitting: Urology

## 2021-03-12 ENCOUNTER — Encounter: Payer: Self-pay | Admitting: Urology

## 2021-03-13 DIAGNOSIS — M9903 Segmental and somatic dysfunction of lumbar region: Secondary | ICD-10-CM | POA: Diagnosis not present

## 2021-03-13 DIAGNOSIS — M9902 Segmental and somatic dysfunction of thoracic region: Secondary | ICD-10-CM | POA: Diagnosis not present

## 2021-03-13 DIAGNOSIS — M62830 Muscle spasm of back: Secondary | ICD-10-CM | POA: Diagnosis not present

## 2021-03-13 DIAGNOSIS — M5136 Other intervertebral disc degeneration, lumbar region: Secondary | ICD-10-CM | POA: Diagnosis not present

## 2021-03-19 ENCOUNTER — Ambulatory Visit: Payer: Self-pay | Admitting: Urology

## 2021-03-20 ENCOUNTER — Encounter: Payer: Self-pay | Admitting: Urology

## 2021-03-29 DIAGNOSIS — I10 Essential (primary) hypertension: Secondary | ICD-10-CM | POA: Diagnosis not present

## 2021-03-29 DIAGNOSIS — Z79891 Long term (current) use of opiate analgesic: Secondary | ICD-10-CM | POA: Diagnosis not present

## 2021-03-29 DIAGNOSIS — K2100 Gastro-esophageal reflux disease with esophagitis, without bleeding: Secondary | ICD-10-CM | POA: Diagnosis not present

## 2021-03-29 DIAGNOSIS — R739 Hyperglycemia, unspecified: Secondary | ICD-10-CM | POA: Diagnosis not present

## 2021-03-29 DIAGNOSIS — G8929 Other chronic pain: Secondary | ICD-10-CM | POA: Diagnosis not present

## 2021-03-29 DIAGNOSIS — M545 Low back pain, unspecified: Secondary | ICD-10-CM | POA: Diagnosis not present

## 2021-03-29 DIAGNOSIS — Z125 Encounter for screening for malignant neoplasm of prostate: Secondary | ICD-10-CM | POA: Diagnosis not present

## 2021-03-29 DIAGNOSIS — E782 Mixed hyperlipidemia: Secondary | ICD-10-CM | POA: Diagnosis not present

## 2021-04-09 ENCOUNTER — Encounter: Payer: Self-pay | Admitting: Urology

## 2021-04-09 DIAGNOSIS — R972 Elevated prostate specific antigen [PSA]: Secondary | ICD-10-CM | POA: Diagnosis not present

## 2021-04-09 DIAGNOSIS — I10 Essential (primary) hypertension: Secondary | ICD-10-CM | POA: Diagnosis not present

## 2021-04-09 DIAGNOSIS — Z8551 Personal history of malignant neoplasm of bladder: Secondary | ICD-10-CM | POA: Diagnosis not present

## 2021-04-09 DIAGNOSIS — K2100 Gastro-esophageal reflux disease with esophagitis, without bleeding: Secondary | ICD-10-CM | POA: Diagnosis not present

## 2021-04-09 DIAGNOSIS — N400 Enlarged prostate without lower urinary tract symptoms: Secondary | ICD-10-CM | POA: Diagnosis not present

## 2021-04-09 DIAGNOSIS — M545 Low back pain, unspecified: Secondary | ICD-10-CM | POA: Diagnosis not present

## 2021-04-09 DIAGNOSIS — E559 Vitamin D deficiency, unspecified: Secondary | ICD-10-CM | POA: Diagnosis not present

## 2021-04-09 DIAGNOSIS — E782 Mixed hyperlipidemia: Secondary | ICD-10-CM | POA: Diagnosis not present

## 2021-04-09 DIAGNOSIS — R739 Hyperglycemia, unspecified: Secondary | ICD-10-CM | POA: Diagnosis not present

## 2021-04-09 DIAGNOSIS — Z79891 Long term (current) use of opiate analgesic: Secondary | ICD-10-CM | POA: Diagnosis not present

## 2021-04-09 DIAGNOSIS — G8929 Other chronic pain: Secondary | ICD-10-CM | POA: Diagnosis not present

## 2021-04-09 DIAGNOSIS — I2510 Atherosclerotic heart disease of native coronary artery without angina pectoris: Secondary | ICD-10-CM | POA: Diagnosis not present

## 2021-04-16 DIAGNOSIS — M5136 Other intervertebral disc degeneration, lumbar region: Secondary | ICD-10-CM | POA: Diagnosis not present

## 2021-04-16 DIAGNOSIS — M9902 Segmental and somatic dysfunction of thoracic region: Secondary | ICD-10-CM | POA: Diagnosis not present

## 2021-04-16 DIAGNOSIS — M62830 Muscle spasm of back: Secondary | ICD-10-CM | POA: Diagnosis not present

## 2021-04-16 DIAGNOSIS — M9903 Segmental and somatic dysfunction of lumbar region: Secondary | ICD-10-CM | POA: Diagnosis not present

## 2021-04-23 ENCOUNTER — Other Ambulatory Visit: Payer: Self-pay | Admitting: *Deleted

## 2021-04-23 DIAGNOSIS — N200 Calculus of kidney: Secondary | ICD-10-CM

## 2021-04-23 NOTE — Progress Notes (Signed)
04/24/21 9:22 AM   Jeffrey Buchanan [birth year] [id number]  Referring provider:  [name], MD [place] [facility] [place],  [place] [address] Chief Complaint  Patient presents with   Benign Prostatic Hypertrophy     HPI: Jeffrey Buchanan is a 66 y.o.male with a personal history of  kidney stones recurrent bladder stones underlying BPH, and ee  , who presents today for f/u with KUB, IPSS, PSA, and PVR.   CT scan on 10/10/2020 indicated recurrent bladder stones, at least 4 measuring up to a centimeter each as well as further progression of his right-sided kidney stones now measuring up to 2 cm but remained nonobstructing.  The bladder wall itself appeared to be thick and with some perivesical fat stranding.   He is s/p Cystolitholapaxy on 01/28/2021. Intraoperative findings showed massive prostamegaly with bilobar coaptation elevated bladder neck.  Moderate to severely trabeculated bladder with multiple bladder stones.  4 to 5 stones in the bladder up to 1 cm.  Unable to identify trigone due to bladder neck elevation.  He declined an outlet procedure now x 2 with recurrent bladder stone episodes.   His most recent PSA on 03/29/2021 was 3.17.   He previously entertained the idea of ESWL for left-sided midpole stone but ultimately decided not to go through with this.  He understands that this was suboptimal treatment for this size and location of stone.  Ultimately he canceled the procedure.  He does not want to treat the stones.  KUB today was personally reviewed and visualized stones in his left kidney are stable with significant stone burden in his right kidney but this is difficult  to see today due to bowel gas, may possibly have a small bladder stone.   He reports that his symptoms are stable at this time. He reports that his bladder stone discomfort has resolved.   He is on chronic Flomax and finasteride.     IPSS     Row Name 04/24/21 0800          International Prostate Symptom Score   How often have you had the sensation of not emptying your bladder? Not at All     How often have you had to urinate less than every two hours? More than half the time     How often have you found you stopped and started again several times when you urinated? Less than 1 in 5 times     How often have you found it difficult to postpone urination? Less than half the time     How often have you had a weak urinary stream? About half the time     How often have you had to strain to start urination? Not at All     How many times did you typically get up at night to urinate? 3 Times     Total IPSS Score 13       Quality of Life due to urinary symptoms   If you were to spend the rest of your life with your urinary condition just the way it is now how would you feel about that? Mixed              Score:  1-7 Mild 8-19 Moderate 20-35 Severe  PMH: Past Medical History:  Diagnosis Date   Acute MI, inferior wall ([place]) 04/06/2003   a.) PCI 04/10/2003 --> 90% pRCA --> 3.5 x 18 mm and 4.0 x 23 mm Vision  stents to RCA.   Anxiety    Arthritis    BPH (benign prostatic hyperplasia)    Chronic anticoagulation    a.) DAPT therapy (ASA + clopidogrel)   Chronic back pain    Coronary artery disease 04/10/2003   a.) LHC 04/10/2003 --> EF 40%; 90% pRCA, 40% x 2 mRCA, 30% and 20% pLCx; 3.5 x 18 mm and 4.0 x 23 mm Vision stents to RCA.   DJD (degenerative joint disease)    GERD (gastroesophageal reflux disease)    Headache    History of adenomatous polyp of colon    S/P  PARTIAL COLECTOMY 2006   History of kidney stones    HLD (hyperlipidemia)    Hyperlipidemia    Hypertension    Skin cancer    Urothelial carcinoma of bladder ([place]) 09/14/2012   a.) Bx 09/14/2012 --> pathology (+) for low grade papillary urothelial carcinoma.   Vitamin D deficiency     Surgical History: Past Surgical History:  Procedure Laterality Date   COLONOSCOPY WITH PROPOFOL N/A  03/04/2017   Procedure: COLONOSCOPY WITH PROPOFOL;  Surgeon: Toledo, Benay Pike, MD;  Location: [facility];  Service: Gastroenterology;  Laterality: N/A;   CORONARY ANGIOPLASTY WITH STENT PLACEMENT Left 04/10/2003   Procedure: CORONARY ANGIOPLASTY WITH STENT PLACEMENT (3.5 x 18 mm and 4.0 x 23 mm Vision stents to RCA); Location: [place]; Surgeon: Serafina Royals, MD   CYSTOSCOPY W/ RETROGRADES Bilateral 09/26/2013   Procedure: CYSTOSCOPY WITH BILATERAL RETROGRADE PYELOGRAM;  Surgeon: Sharyn Creamer, MD;  Location[facility];  Service: Urology;  Laterality: Bilateral;   CYSTOSCOPY WITH LITHOLAPAXY N/A 06/13/2019   Procedure: CYSTOSCOPY;  Surgeon: Hollice Espy, MD;  Location: [facility];  Service: Urology;  Laterality: N/A;   CYSTOSCOPY WITH LITHOLAPAXY N/A 01/28/2021   Procedure: CYSTOSCOPY WITH LITHOLAPAXY;  Surgeon: Hollice Espy, MD;  Location: [facility];  Service: Urology;  Laterality: N/A;   ESOPHAGOGASTRODUODENOSCOPY (EGD) WITH PROPOFOL N/A 03/04/2017   Procedure: ESOPHAGOGASTRODUODENOSCOPY (EGD) WITH PROPOFOL;  Surgeon: Toledo, Benay Pike, MD;  Location: [facility];  Service: Gastroenterology;  Laterality: N/A;   OPEN REDUCTION SHOULDER DISLOCATION  04/07/1974   PARTIAL COLECTOMY  04/07/2004   STONE EXTRACTION WITH BASKET N/A 06/13/2019   Procedure: STONE EXTRACTION WITH BASKET;  Surgeon: Hollice Espy, MD;  Location: [facility];  Service: Urology;  Laterality: N/A;   TRANSURETHRAL RESECTION OF BLADDER TUMOR N/A 2014   TRANSURETHRAL RESECTION OF BLADDER TUMOR WITH GYRUS (TURBT-GYRUS) N/A 09/26/2013   Procedure: BLADDER BIOPSY;  Surgeon: Sharyn Creamer, MD;  Location: [facility];  Service: Urology;  Laterality: N/A;    Home Medications:  Allergies as of 04/24/2021       Reactions   Cephalosporins Diarrhea   Escitalopram Oxalate Other (See Comments)   lethargic        Medication List        Accurate as of April 24, 2021  9:22 AM. If  you have any questions, ask your nurse or doctor.          ALPRAZolam 0.25 MG tablet Commonly known as: XANAX Take 0.25 mg by mouth in the morning and at bedtime.   aspirin EC 81 MG tablet Take 81 mg by mouth at bedtime.   clopidogrel 75 MG tablet Commonly known as: PLAVIX Take 75 mg by mouth at bedtime.   finasteride 5 MG tablet Commonly known as: PROSCAR TAKE 1 TABLET BY MOUTH EVERY DAY   hydrochlorothiazide 12.5 MG tablet Commonly known as: HYDRODIURIL Take 12.5  mg by mouth at bedtime.   HYDROcodone-acetaminophen 5-325 MG tablet Commonly known as: NORCO/VICODIN Take 1 tablet by mouth 4 (four) times daily as needed (pain.).   ibuprofen 200 MG tablet Commonly known as: ADVIL Take 200 mg by mouth daily.   lisinopril 10 MG tablet Commonly known as: ZESTRIL Take 10 mg by mouth at bedtime.   metoprolol succinate 50 MG 24 hr tablet Commonly known as: TOPROL-XL Take 50 mg by mouth every evening. Take with or immediately following a meal.   rosuvastatin 40 MG tablet Commonly known as: CRESTOR Take 40 mg by mouth at bedtime.   tamsulosin 0.4 MG Caps capsule Commonly known as: FLOMAX TAKE 2 CAPSULES BY MOUTH AT BEDTIME.   triamcinolone cream 0.1 % Commonly known as: KENALOG Apply 1 application topically 2 (two) times daily as needed (Rash). For itch on right thigh. Avoid applying to face, groin, and axilla. Use as directed.   Vitamin D3 50 MCG (2000 UT) Tabs Take 2,000 Units by mouth daily.        Allergies:  Allergies  Allergen Reactions   Cephalosporins Diarrhea   Escitalopram Oxalate Other (See Comments)    lethargic    Family History: Family History  Problem Relation Age of Onset   Prostate cancer Brother    Kidney disease Neg Hx     Social History:  reports that he has been smoking cigarettes. He has a 32.00 pack-year smoking history. He has never used smokeless tobacco. He reports that he does not drink alcohol and does not use  drugs.   Physical Exam: BP 125/74    Pulse 75    Ht 6\' 2"  (1.88 m)    Wt 239 lb (108.4 kg)    BMI 30.69 kg/m   Constitutional:  Alert and oriented, No acute distress. HEENT: Central AT, moist mucus membranes.  Trachea midline, no masses. Cardiovascular: No clubbing, cyanosis, or edema. Respiratory: Normal respiratory effort, no increased work of breathing. Skin: No rashes, bruises or suspicious lesions. Neurologic: Grossly intact, no focal deficits, moving all 4 extremities. Psychiatric: Normal mood and affect.  Laboratory Data:  Lab Results  Component Value Date   CREATININE 1.02 10/09/2020   Pertinent Imaging: KUB was personally reviewed and interpreted today, see HPI.  This was compared to CT stone from 10/2020 which showed significant right-sided stone burden, up to 1.8 cm as well as smaller left-sided stone which appears stable today.  He may have recurrent bladder calcifications are small.   Assessment & Plan:    BPH with outlet obstruction  - Significant ongoing obstructive urinary symptoms on maximal medical therapy with a history of bladder stones - He continues to have obstruction; we will keep him on maximal medical therapy including Flomax and finasteride - He continues to decline procedure but would likely benefit from this greatly in terms of recurrent stones as well as from symptomatology -PSA screening up-to-date, elevated but likely appropriate for gland size, will continue to check annually  2. Kidney stones  - Stones are visualized and stable on KUB today  -  He is not interested in intervention, will continue to follow serial KUB annually.  Reviewed stone diet recommendations encourage hydration, etc.  Follow-up 1 year with KUB, IPSS/PVR/PSA  I,Kailey Littlejohn,acting as a scribe for Hollice Espy, MD.,have documented all relevant documentation on the behalf of Hollice Espy, MD,as directed by  Hollice Espy, MD while in the presence of Hollice Espy, MD.  I  have reviewed the above documentation for accuracy and  completeness, and I agree with the above.   Hollice Espy, MD   [facility] [address] [place], [place] [address] [phone]

## 2021-04-24 ENCOUNTER — Other Ambulatory Visit: Payer: Self-pay

## 2021-04-24 ENCOUNTER — Ambulatory Visit
Admission: RE | Admit: 2021-04-24 | Discharge: 2021-04-24 | Disposition: A | Discharge status: Home / Self Care | Payer: PPO | Source: Ambulatory Visit | Attending: Urology | Admitting: Urology

## 2021-04-24 ENCOUNTER — Ambulatory Visit: Payer: PPO | Admitting: Urology

## 2021-04-24 VITALS — BP 125/74 | HR 75 | Ht 74.0 in | Wt 239.0 lb

## 2021-04-24 DIAGNOSIS — N401 Enlarged prostate with lower urinary tract symptoms: Secondary | ICD-10-CM | POA: Diagnosis not present

## 2021-04-24 DIAGNOSIS — N200 Calculus of kidney: Secondary | ICD-10-CM | POA: Diagnosis not present

## 2021-04-24 DIAGNOSIS — N3943 Post-void dribbling: Secondary | ICD-10-CM

## 2021-05-14 DIAGNOSIS — M9903 Segmental and somatic dysfunction of lumbar region: Secondary | ICD-10-CM | POA: Diagnosis not present

## 2021-05-14 DIAGNOSIS — M62830 Muscle spasm of back: Secondary | ICD-10-CM | POA: Diagnosis not present

## 2021-05-14 DIAGNOSIS — M5136 Other intervertebral disc degeneration, lumbar region: Secondary | ICD-10-CM | POA: Diagnosis not present

## 2021-05-14 DIAGNOSIS — M9902 Segmental and somatic dysfunction of thoracic region: Secondary | ICD-10-CM | POA: Diagnosis not present

## 2021-06-13 ENCOUNTER — Other Ambulatory Visit: Payer: Self-pay | Admitting: Urology

## 2021-06-13 DIAGNOSIS — N3943 Post-void dribbling: Secondary | ICD-10-CM

## 2021-06-17 DIAGNOSIS — M5136 Other intervertebral disc degeneration, lumbar region: Secondary | ICD-10-CM | POA: Diagnosis not present

## 2021-06-17 DIAGNOSIS — M62830 Muscle spasm of back: Secondary | ICD-10-CM | POA: Diagnosis not present

## 2021-06-17 DIAGNOSIS — M9902 Segmental and somatic dysfunction of thoracic region: Secondary | ICD-10-CM | POA: Diagnosis not present

## 2021-06-17 DIAGNOSIS — M9903 Segmental and somatic dysfunction of lumbar region: Secondary | ICD-10-CM | POA: Diagnosis not present

## 2021-07-01 DIAGNOSIS — I2510 Atherosclerotic heart disease of native coronary artery without angina pectoris: Secondary | ICD-10-CM | POA: Diagnosis not present

## 2021-07-01 DIAGNOSIS — Z79891 Long term (current) use of opiate analgesic: Secondary | ICD-10-CM | POA: Diagnosis not present

## 2021-07-01 DIAGNOSIS — R739 Hyperglycemia, unspecified: Secondary | ICD-10-CM | POA: Diagnosis not present

## 2021-07-08 DIAGNOSIS — R739 Hyperglycemia, unspecified: Secondary | ICD-10-CM | POA: Diagnosis not present

## 2021-07-08 DIAGNOSIS — I2510 Atherosclerotic heart disease of native coronary artery without angina pectoris: Secondary | ICD-10-CM | POA: Diagnosis not present

## 2021-07-08 DIAGNOSIS — M545 Low back pain, unspecified: Secondary | ICD-10-CM | POA: Diagnosis not present

## 2021-07-08 DIAGNOSIS — K2100 Gastro-esophageal reflux disease with esophagitis, without bleeding: Secondary | ICD-10-CM | POA: Diagnosis not present

## 2021-07-08 DIAGNOSIS — Z79891 Long term (current) use of opiate analgesic: Secondary | ICD-10-CM | POA: Diagnosis not present

## 2021-07-08 DIAGNOSIS — G8929 Other chronic pain: Secondary | ICD-10-CM | POA: Diagnosis not present

## 2021-07-08 DIAGNOSIS — Z8551 Personal history of malignant neoplasm of bladder: Secondary | ICD-10-CM | POA: Diagnosis not present

## 2021-07-08 DIAGNOSIS — Z125 Encounter for screening for malignant neoplasm of prostate: Secondary | ICD-10-CM | POA: Diagnosis not present

## 2021-07-08 DIAGNOSIS — I10 Essential (primary) hypertension: Secondary | ICD-10-CM | POA: Diagnosis not present

## 2021-07-08 DIAGNOSIS — E782 Mixed hyperlipidemia: Secondary | ICD-10-CM | POA: Diagnosis not present

## 2021-07-10 DIAGNOSIS — I10 Essential (primary) hypertension: Secondary | ICD-10-CM | POA: Diagnosis not present

## 2021-07-10 DIAGNOSIS — I2510 Atherosclerotic heart disease of native coronary artery without angina pectoris: Secondary | ICD-10-CM | POA: Diagnosis not present

## 2021-07-10 DIAGNOSIS — E782 Mixed hyperlipidemia: Secondary | ICD-10-CM | POA: Diagnosis not present

## 2021-07-16 DIAGNOSIS — M62830 Muscle spasm of back: Secondary | ICD-10-CM | POA: Diagnosis not present

## 2021-07-16 DIAGNOSIS — M5136 Other intervertebral disc degeneration, lumbar region: Secondary | ICD-10-CM | POA: Diagnosis not present

## 2021-07-16 DIAGNOSIS — M9902 Segmental and somatic dysfunction of thoracic region: Secondary | ICD-10-CM | POA: Diagnosis not present

## 2021-07-16 DIAGNOSIS — M9903 Segmental and somatic dysfunction of lumbar region: Secondary | ICD-10-CM | POA: Diagnosis not present

## 2021-08-12 DIAGNOSIS — M62830 Muscle spasm of back: Secondary | ICD-10-CM | POA: Diagnosis not present

## 2021-08-12 DIAGNOSIS — M9903 Segmental and somatic dysfunction of lumbar region: Secondary | ICD-10-CM | POA: Diagnosis not present

## 2021-08-12 DIAGNOSIS — M9902 Segmental and somatic dysfunction of thoracic region: Secondary | ICD-10-CM | POA: Diagnosis not present

## 2021-08-12 DIAGNOSIS — M5136 Other intervertebral disc degeneration, lumbar region: Secondary | ICD-10-CM | POA: Diagnosis not present

## 2021-08-28 ENCOUNTER — Ambulatory Visit: Payer: PPO | Admitting: Dermatology

## 2021-08-28 DIAGNOSIS — L820 Inflamed seborrheic keratosis: Secondary | ICD-10-CM

## 2021-08-28 DIAGNOSIS — L578 Other skin changes due to chronic exposure to nonionizing radiation: Secondary | ICD-10-CM

## 2021-08-28 DIAGNOSIS — L853 Xerosis cutis: Secondary | ICD-10-CM | POA: Diagnosis not present

## 2021-08-28 DIAGNOSIS — B079 Viral wart, unspecified: Secondary | ICD-10-CM | POA: Diagnosis not present

## 2021-08-28 DIAGNOSIS — L821 Other seborrheic keratosis: Secondary | ICD-10-CM

## 2021-08-28 NOTE — Progress Notes (Signed)
Follow-Up Visit   Subjective  Jeffrey Buchanan is a 66 y.o. male who presents for the following: Skin Problem (Concerned about a spot that is growing he noticed over the weekend at left upper eyelid. He is afraid spot may scratch eye. /Patient also reports a rash at right thigh states itches occasionally. He was prescribed tmc to use and said that it helped some but stopped using. He has just been using hc 1 % cream to areas. /Reports rough scaly thickened areas at elbows. He forgot what cream was recommended to use at elbows. ).  He rests his arms up on the edge of the table playing [place].  The patient has spots, moles and lesions to be evaluated, some may be new or changing and the patient has concerns that these could be cancer.  The following portions of the chart were reviewed this encounter and updated as appropriate:      Review of Systems: No other skin or systemic complaints except as noted in HPI or Assessment and Plan.   Objective  Well appearing patient in no apparent distress; mood and affect are within normal limits.  A focused examination was performed including left upper eyelid, face, b/l elbows, left forearm, right thigh. Relevant physical exam findings are noted in the Assessment and Plan.  left upper eyelid Tiny waxy flesh flat papules at left upper eyelid, with one at margin  left forearm x 1 Verrucous papule -- Discussed viral etiology and contagion.   b/l elbows Violaceous plaque with lichenification and hyperkeratosis  right lateral thigh x 1 Erythematous stuck-on, waxy papule with surrounding mild erythema   Assessment & Plan  Seborrheic keratosis left upper eyelid  Benign, Observe.  Patient concerned may scratch eye and is harmful.  Discussed this is a benign growth and will not harm eye  Discussed the lesion at margin is too small for removal today, will continue to monitor, instructed if grows larger and bothersome will consider removal by snip  excision   Viral warts, unspecified type left forearm x 1  Discussed viral etiology and risk of spread.  Discussed multiple treatments may be required to clear warts.  Discussed possible post-treatment dyspigmentation and risk of recurrence.  Destruction of lesion - left forearm x 1  Destruction method: cryotherapy   Informed consent: discussed and consent obtained   Lesion destroyed using liquid nitrogen: Yes   Region frozen until ice ball extended beyond lesion: Yes   Outcome: patient tolerated procedure well with no complications   Post-procedure details: wound care instructions given   Additional details:  Prior to procedure, discussed risks of blister formation, small wound, skin dyspigmentation, or rare scar following cryotherapy. Recommend Vaseline ointment to treated areas while healing.   Xerosis cutis b/l elbows  With lichenification, due to persistent pressure on elbows (pt rests arms on table edge), not itchy   Recommend Amlactin cream or Gold Bond Rough and Bumpy qd/bid - information given in patient handout  Can use Tmc 0.1 % cream for flares at elbows qd to bid prn. Caution skin atrophy with long-term use.   Topical steroids (such as triamcinolone, fluocinolone, fluocinonide, mometasone, clobetasol, halobetasol, betamethasone, hydrocortisone) can cause thinning and lightening of the skin if they are used for too long in the same area. Your physician has selected the right strength medicine for your problem and area affected on the body. Please use your medication only as directed by your physician to prevent side effects.     Inflamed seborrheic  keratosis right lateral thigh x 1  With associated Nummular Dermatitis  Cryotherapy today to ISK Can Restart TMC once area at right thigh has healed if still itchy. Triamcinolone cream 0.1 % cream apply to affected areas daily as needed for itch. Avoid applying to face, groin, and axilla. Use as directed. Risk of skin  atrophy with long-term use reviewed.    Topical steroids (such as triamcinolone, fluocinolone, fluocinonide, mometasone, clobetasol, halobetasol, betamethasone, hydrocortisone) can cause thinning and lightening of the skin if they are used for too long in the same area. Your physician has selected the right strength medicine for your problem and area affected on the body. Please use your medication only as directed by your physician to prevent side effects.   Destruction of lesion - right lateral thigh x 1  Destruction method: cryotherapy   Informed consent: discussed and consent obtained   Lesion destroyed using liquid nitrogen: Yes   Region frozen until ice ball extended beyond lesion: Yes   Outcome: patient tolerated procedure well with no complications   Post-procedure details: wound care instructions given   Additional details:  Prior to procedure, discussed risks of blister formation, small wound, skin dyspigmentation, or rare scar following cryotherapy. Recommend Vaseline ointment to treated areas while healing.    Seborrheic Keratoses - Stuck-on, waxy, tan-brown papules and/or plaques  - Benign-appearing - Discussed benign etiology and prognosis. - Observe - Call for any changes   Actinic Damage - chronic, secondary to cumulative UV radiation exposure/sun exposure over time - diffuse scaly erythematous macules with underlying dyspigmentation - Recommend daily broad spectrum sunscreen SPF 30+ to sun-exposed areas, reapply every 2 hours as needed.  - Recommend staying in the shade or wearing long sleeves, sun glasses (UVA+UVB protection) and wide brim hats (4-inch brim around the entire circumference of the hat). - Call for new or changing lesions.   Return if symptoms worsen or fail to improve. I, Ruthell Rummage, CMA, am acting as scribe for Brendolyn Patty, MD.  Documentation: I have reviewed the above documentation for accuracy and completeness, and I agree with the  above.  Brendolyn Patty MD

## 2021-08-28 NOTE — Patient Instructions (Addendum)
For Elbows  Can use Triamcinolone cream 0.1 %  for flares at elbows daily to twice daily as needed.  Avoid applying to face, groin, and axilla. Use as directed. Long-term use can cause thinning of the skin.  Topical steroids (such as triamcinolone, fluocinolone, fluocinonide, mometasone, clobetasol, halobetasol, betamethasone, hydrocortisone) can cause thinning and lightening of the skin if they are used for too long in the same area. Your physician has selected the right strength medicine for your problem and area affected on the body. Please use your medication only as directed by your physician to prevent side effects.    Recommend starting moisturizer with exfoliant (Urea, Salicylic acid, or Lactic acid) one to two times daily to help smooth rough and bumpy skin.  OTC options include Cetaphil Rough and Bumpy lotion (Urea), Eucerin Roughness Relief lotion or spot treatment cream (Urea), CeraVe SA lotion/cream for Rough and Bumpy skin (Sal Acid), Gold Bond Rough and Bumpy cream (Sal Acid), and AmLactin 12% lotion/cream (Lactic Acid).  If applying in morning, also apply sunscreen to sun-exposed areas, since these exfoliating moisturizers can increase sensitivity to sun.   For spots at thigh  Can Restart once area at right thigh has healed Triamcinolone cream 0.1 % cream apply to affected areas daily as needed for itch. Avoid applying to face, groin, and axilla. Use as directed. Risk of skin atrophy with long-term use reviewed.    Topical steroids (such as triamcinolone, fluocinolone, fluocinonide, mometasone, clobetasol, halobetasol, betamethasone, hydrocortisone) can cause thinning and lightening of the skin if they are used for too long in the same area. Your physician has selected the right strength medicine for your problem and area affected on the body. Please use your medication only as directed by your physician to prevent side effects.     Cryotherapy Aftercare  Wash gently with soap  and water everyday.   Apply Vaseline and Band-Aid daily until healed.   Seborrheic Keratosis  What causes seborrheic keratoses? Seborrheic keratoses are harmless, common skin growths that first appear during adult life.  As time goes by, more growths appear.  Some people may develop a large number of them.  Seborrheic keratoses appear on both covered and uncovered body parts.  They are not caused by sunlight.  The tendency to develop seborrheic keratoses can be inherited.  They vary in color from skin-colored to gray, brown, or even black.  They can be either smooth or have a rough, warty surface.   Seborrheic keratoses are superficial and look as if they were stuck on the skin.  Under the microscope this type of keratosis looks like layers upon layers of skin.  That is why at times the top layer may seem to fall off, but the rest of the growth remains and re-grows.    Treatment Seborrheic keratoses do not need to be treated, but can easily be removed in the office.  Seborrheic keratoses often cause symptoms when they rub on clothing or jewelry.  Lesions can be in the way of shaving.  If they become inflamed, they can cause itching, soreness, or burning.  Removal of a seborrheic keratosis can be accomplished by freezing, burning, or surgery. If any spot bleeds, scabs, or grows rapidly, please return to have it checked, as these can be an indication of a skin cancer.       If You Need Anything After Your Visit  If you have any questions or concerns for your doctor, please call our main line at [phone] and press  option 4 to reach your doctor's medical assistant. If no one answers, please leave a voicemail as directed and we will return your call as soon as possible. Messages left after 4 pm will be answered the following business day.   You may also send [place] a message via [place]. We typically respond to MyChart messages within 1-2 business days.  For prescription refills, please ask your  pharmacy to contact our office. Our fax number is [phone].  If you have an urgent issue when the clinic is closed that cannot wait until the next business day, you can page your doctor at the number below.    Please note that while we do our best to be available for urgent issues outside of office hours, we are not available 24/7.   If you have an urgent issue and are unable to reach [place], you may choose to seek medical care at your doctor's office, retail clinic, urgent care center, or emergency room.  If you have a medical emergency, please immediately call 911 or go to the emergency department.  Pager Numbers  - Dr. Nehemiah Massed: [phone]  - Dr. Laurence Ferrari: [phone]  - Dr. Nicole Kindred: [phone]  In the event of inclement weather, please call our main line at [phone] for an update on the status of any delays or closures.  Dermatology Medication Tips: Please keep the boxes that topical medications come in in order to help keep track of the instructions about where and how to use these. Pharmacies typically print the medication instructions only on the boxes and not directly on the medication tubes.   If your medication is too expensive, please contact our office at [phone] option 4 or send [place] a message through [place].   We are unable to tell what your co-pay for medications will be in advance as this is different depending on your insurance coverage. However, we may be able to find a substitute medication at lower cost or fill out paperwork to get insurance to cover a needed medication.   If a prior authorization is required to get your medication covered by your insurance company, please allow [place] 1-2 business days to complete this process.  Drug prices often vary depending on where the prescription is filled and some pharmacies may offer cheaper prices.  The website www.goodrx.com contains coupons for medications through different pharmacies. The prices here do not  account for what the cost may be with help from insurance (it may be cheaper with your insurance), but the website can give you the price if you did not use any insurance.  - You can print the associated coupon and take it with your prescription to the pharmacy.  - You may also stop by our office during regular business hours and pick up a GoodRx coupon card.  - If you need your prescription sent electronically to a different pharmacy, notify our office through [facility] or by phone at [phone] option 4.     Si Usted Necesita Algo Despus de Su Visita  Tambin puede enviarnos un mensaje a travs de Pharmacist, community. Por lo general respondemos a los mensajes de MyChart en el transcurso de 1 a 2 das hbiles.  Para renovar recetas, por favor pida a su farmacia que se ponga en contacto con nuestra oficina. Harland Dingwall de fax es [place] [phone].  Si tiene un asunto urgente cuando la clnica est cerrada y que no puede esperar hasta el siguiente da hbil, puede llamar/localizar a su doctor(a) al [employer]  aparece a continuacin.   Por favor, tenga en cuenta que aunque hacemos todo lo posible para estar disponibles para asuntos urgentes fuera del horario de [place], no estamos disponibles las 24 horas del da, los 7 das de la [place].   Si tiene un problema urgente y no puede comunicarse con nosotros, puede optar por buscar atencin mdica  en el consultorio de su doctor(a), en una clnica privada, en un centro de atencin urgente o en una sala de emergencias.  Si tiene Engineering geologist, por favor llame inmediatamente al 911 o vaya a la sala de emergencias.  Nmeros de bper  - Dr. Nehemiah Massed: [phone]  - Dra. Moye: [phone]  - Dra. Nicole Kindred: [phone]  En caso de inclemencias del [place], por favor llame a Johnsie Kindred principal al [phone] para una actualizacin sobre el [place] de cualquier retraso o cierre.  Consejos para la medicacin en dermatologa: Por  favor, guarde las cajas en las que vienen los medicamentos de uso tpico para ayudarle a seguir las instrucciones sobre dnde y cmo usarlos. Las farmacias generalmente imprimen las instrucciones del medicamento slo en las cajas y no directamente en los tubos del [place].   Si su medicamento es muy caro, por favor, pngase en contacto con Zigmund Daniel llamando al [phone] y presione la opcin 4 o envenos un mensaje a travs de Pharmacist, community.   No podemos decirle cul ser su copago por los medicamentos por adelantado ya que esto es diferente dependiendo de la cobertura de su seguro. Sin embargo, es posible que podamos encontrar un medicamento sustituto a Electrical engineer un formulario para que el seguro cubra el medicamento que se considera necesario.   Si se requiere una autorizacin previa para que su compaa de seguros [place] su medicamento, por favor permtanos de 1 a 2 das hbiles para completar este proceso.  Los precios de los medicamentos varan con frecuencia dependiendo del Environmental consultant de dnde se surte la receta y alguna farmacias pueden ofrecer precios ms baratos.  El sitio web www.goodrx.com tiene cupones para medicamentos de Airline pilot. Los precios aqu no tienen en cuenta lo que podra costar con la ayuda del seguro (puede ser ms barato con su seguro), pero el sitio web puede darle el precio si no utiliz Research scientist (physical sciences).  - Puede imprimir el cupn correspondiente y llevarlo con su receta a la farmacia.  - Tambin puede pasar por nuestra oficina durante el horario de atencin regular y Charity fundraiser una tarjeta de cupones de GoodRx.  - Si necesita que su receta se enve electrnicamente a una farmacia diferente, informe a nuestra oficina a travs de MyChart de [place] o por telfono llamando al [phone] y presione la opcin 4.

## 2021-09-16 DIAGNOSIS — M9902 Segmental and somatic dysfunction of thoracic region: Secondary | ICD-10-CM | POA: Diagnosis not present

## 2021-09-16 DIAGNOSIS — M62830 Muscle spasm of back: Secondary | ICD-10-CM | POA: Diagnosis not present

## 2021-09-16 DIAGNOSIS — M5136 Other intervertebral disc degeneration, lumbar region: Secondary | ICD-10-CM | POA: Diagnosis not present

## 2021-09-16 DIAGNOSIS — M9903 Segmental and somatic dysfunction of lumbar region: Secondary | ICD-10-CM | POA: Diagnosis not present

## 2021-10-03 ENCOUNTER — Other Ambulatory Visit: Payer: Self-pay | Admitting: Urology

## 2021-10-03 DIAGNOSIS — G8929 Other chronic pain: Secondary | ICD-10-CM | POA: Diagnosis not present

## 2021-10-03 DIAGNOSIS — E782 Mixed hyperlipidemia: Secondary | ICD-10-CM | POA: Diagnosis not present

## 2021-10-03 DIAGNOSIS — Z125 Encounter for screening for malignant neoplasm of prostate: Secondary | ICD-10-CM | POA: Diagnosis not present

## 2021-10-03 DIAGNOSIS — I10 Essential (primary) hypertension: Secondary | ICD-10-CM | POA: Diagnosis not present

## 2021-10-03 DIAGNOSIS — N401 Enlarged prostate with lower urinary tract symptoms: Secondary | ICD-10-CM

## 2021-10-03 DIAGNOSIS — Z79891 Long term (current) use of opiate analgesic: Secondary | ICD-10-CM | POA: Diagnosis not present

## 2021-10-03 DIAGNOSIS — R739 Hyperglycemia, unspecified: Secondary | ICD-10-CM | POA: Diagnosis not present

## 2021-10-03 DIAGNOSIS — M545 Low back pain, unspecified: Secondary | ICD-10-CM | POA: Diagnosis not present

## 2021-10-03 DIAGNOSIS — K2100 Gastro-esophageal reflux disease with esophagitis, without bleeding: Secondary | ICD-10-CM | POA: Diagnosis not present

## 2021-10-09 DIAGNOSIS — Z8551 Personal history of malignant neoplasm of bladder: Secondary | ICD-10-CM | POA: Diagnosis not present

## 2021-10-09 DIAGNOSIS — R7303 Prediabetes: Secondary | ICD-10-CM | POA: Diagnosis not present

## 2021-10-09 DIAGNOSIS — M545 Low back pain, unspecified: Secondary | ICD-10-CM | POA: Diagnosis not present

## 2021-10-09 DIAGNOSIS — K2100 Gastro-esophageal reflux disease with esophagitis, without bleeding: Secondary | ICD-10-CM | POA: Diagnosis not present

## 2021-10-09 DIAGNOSIS — Z79891 Long term (current) use of opiate analgesic: Secondary | ICD-10-CM | POA: Diagnosis not present

## 2021-10-09 DIAGNOSIS — N400 Enlarged prostate without lower urinary tract symptoms: Secondary | ICD-10-CM | POA: Diagnosis not present

## 2021-10-09 DIAGNOSIS — F17210 Nicotine dependence, cigarettes, uncomplicated: Secondary | ICD-10-CM | POA: Diagnosis not present

## 2021-10-09 DIAGNOSIS — E782 Mixed hyperlipidemia: Secondary | ICD-10-CM | POA: Diagnosis not present

## 2021-10-09 DIAGNOSIS — Z0000 Encounter for general adult medical examination without abnormal findings: Secondary | ICD-10-CM | POA: Diagnosis not present

## 2021-10-09 DIAGNOSIS — E559 Vitamin D deficiency, unspecified: Secondary | ICD-10-CM | POA: Diagnosis not present

## 2021-10-09 DIAGNOSIS — I10 Essential (primary) hypertension: Secondary | ICD-10-CM | POA: Diagnosis not present

## 2021-10-09 DIAGNOSIS — I2510 Atherosclerotic heart disease of native coronary artery without angina pectoris: Secondary | ICD-10-CM | POA: Diagnosis not present

## 2021-10-11 ENCOUNTER — Other Ambulatory Visit: Payer: Self-pay | Admitting: Urology

## 2021-10-11 DIAGNOSIS — N3943 Post-void dribbling: Secondary | ICD-10-CM

## 2021-10-14 DIAGNOSIS — M9902 Segmental and somatic dysfunction of thoracic region: Secondary | ICD-10-CM | POA: Diagnosis not present

## 2021-10-14 DIAGNOSIS — M62830 Muscle spasm of back: Secondary | ICD-10-CM | POA: Diagnosis not present

## 2021-10-14 DIAGNOSIS — M5136 Other intervertebral disc degeneration, lumbar region: Secondary | ICD-10-CM | POA: Diagnosis not present

## 2021-10-14 DIAGNOSIS — M9903 Segmental and somatic dysfunction of lumbar region: Secondary | ICD-10-CM | POA: Diagnosis not present

## 2021-10-29 ENCOUNTER — Other Ambulatory Visit: Payer: Self-pay | Admitting: Internal Medicine

## 2021-10-29 DIAGNOSIS — F17210 Nicotine dependence, cigarettes, uncomplicated: Secondary | ICD-10-CM

## 2021-10-29 DIAGNOSIS — I10 Essential (primary) hypertension: Secondary | ICD-10-CM

## 2021-11-05 ENCOUNTER — Other Ambulatory Visit: Payer: Self-pay | Admitting: Urology

## 2021-11-05 DIAGNOSIS — N3943 Post-void dribbling: Secondary | ICD-10-CM

## 2021-11-12 DIAGNOSIS — M9903 Segmental and somatic dysfunction of lumbar region: Secondary | ICD-10-CM | POA: Diagnosis not present

## 2021-11-12 DIAGNOSIS — M5136 Other intervertebral disc degeneration, lumbar region: Secondary | ICD-10-CM | POA: Diagnosis not present

## 2021-11-12 DIAGNOSIS — M9902 Segmental and somatic dysfunction of thoracic region: Secondary | ICD-10-CM | POA: Diagnosis not present

## 2021-11-12 DIAGNOSIS — M62830 Muscle spasm of back: Secondary | ICD-10-CM | POA: Diagnosis not present

## 2021-11-15 ENCOUNTER — Other Ambulatory Visit: Payer: PPO

## 2022-01-09 DIAGNOSIS — K2100 Gastro-esophageal reflux disease with esophagitis, without bleeding: Secondary | ICD-10-CM | POA: Diagnosis not present

## 2022-01-09 DIAGNOSIS — E782 Mixed hyperlipidemia: Secondary | ICD-10-CM | POA: Diagnosis not present

## 2022-01-09 DIAGNOSIS — Z79891 Long term (current) use of opiate analgesic: Secondary | ICD-10-CM | POA: Diagnosis not present

## 2022-01-09 DIAGNOSIS — R7303 Prediabetes: Secondary | ICD-10-CM | POA: Diagnosis not present

## 2022-01-09 DIAGNOSIS — I2510 Atherosclerotic heart disease of native coronary artery without angina pectoris: Secondary | ICD-10-CM | POA: Diagnosis not present

## 2022-01-15 DIAGNOSIS — R0602 Shortness of breath: Secondary | ICD-10-CM | POA: Diagnosis not present

## 2022-01-15 DIAGNOSIS — I10 Essential (primary) hypertension: Secondary | ICD-10-CM | POA: Diagnosis not present

## 2022-01-15 DIAGNOSIS — I493 Ventricular premature depolarization: Secondary | ICD-10-CM | POA: Diagnosis not present

## 2022-01-15 DIAGNOSIS — R002 Palpitations: Secondary | ICD-10-CM | POA: Diagnosis not present

## 2022-01-15 DIAGNOSIS — E782 Mixed hyperlipidemia: Secondary | ICD-10-CM | POA: Diagnosis not present

## 2022-01-15 DIAGNOSIS — I2510 Atherosclerotic heart disease of native coronary artery without angina pectoris: Secondary | ICD-10-CM | POA: Diagnosis not present

## 2022-01-16 DIAGNOSIS — I2510 Atherosclerotic heart disease of native coronary artery without angina pectoris: Secondary | ICD-10-CM | POA: Diagnosis not present

## 2022-01-16 DIAGNOSIS — E782 Mixed hyperlipidemia: Secondary | ICD-10-CM | POA: Diagnosis not present

## 2022-01-16 DIAGNOSIS — I10 Essential (primary) hypertension: Secondary | ICD-10-CM | POA: Diagnosis not present

## 2022-01-16 DIAGNOSIS — R7303 Prediabetes: Secondary | ICD-10-CM | POA: Diagnosis not present

## 2022-01-16 DIAGNOSIS — K2100 Gastro-esophageal reflux disease with esophagitis, without bleeding: Secondary | ICD-10-CM | POA: Diagnosis not present

## 2022-01-16 DIAGNOSIS — Z23 Encounter for immunization: Secondary | ICD-10-CM | POA: Diagnosis not present

## 2022-01-16 DIAGNOSIS — N400 Enlarged prostate without lower urinary tract symptoms: Secondary | ICD-10-CM | POA: Diagnosis not present

## 2022-01-16 DIAGNOSIS — E559 Vitamin D deficiency, unspecified: Secondary | ICD-10-CM | POA: Diagnosis not present

## 2022-01-16 DIAGNOSIS — G8929 Other chronic pain: Secondary | ICD-10-CM | POA: Diagnosis not present

## 2022-01-16 DIAGNOSIS — R972 Elevated prostate specific antigen [PSA]: Secondary | ICD-10-CM | POA: Diagnosis not present

## 2022-01-16 DIAGNOSIS — Z79891 Long term (current) use of opiate analgesic: Secondary | ICD-10-CM | POA: Diagnosis not present

## 2022-01-16 DIAGNOSIS — M545 Low back pain, unspecified: Secondary | ICD-10-CM | POA: Diagnosis not present

## 2022-01-17 DIAGNOSIS — M9902 Segmental and somatic dysfunction of thoracic region: Secondary | ICD-10-CM | POA: Diagnosis not present

## 2022-01-17 DIAGNOSIS — M62830 Muscle spasm of back: Secondary | ICD-10-CM | POA: Diagnosis not present

## 2022-01-17 DIAGNOSIS — M9903 Segmental and somatic dysfunction of lumbar region: Secondary | ICD-10-CM | POA: Diagnosis not present

## 2022-01-17 DIAGNOSIS — M5136 Other intervertebral disc degeneration, lumbar region: Secondary | ICD-10-CM | POA: Diagnosis not present

## 2022-01-27 DIAGNOSIS — R002 Palpitations: Secondary | ICD-10-CM | POA: Diagnosis not present

## 2022-02-04 DIAGNOSIS — R002 Palpitations: Secondary | ICD-10-CM | POA: Diagnosis not present

## 2022-02-04 DIAGNOSIS — R0602 Shortness of breath: Secondary | ICD-10-CM | POA: Diagnosis not present

## 2022-02-10 DIAGNOSIS — M5136 Other intervertebral disc degeneration, lumbar region: Secondary | ICD-10-CM | POA: Diagnosis not present

## 2022-02-10 DIAGNOSIS — M62830 Muscle spasm of back: Secondary | ICD-10-CM | POA: Diagnosis not present

## 2022-02-10 DIAGNOSIS — M9902 Segmental and somatic dysfunction of thoracic region: Secondary | ICD-10-CM | POA: Diagnosis not present

## 2022-02-10 DIAGNOSIS — M9903 Segmental and somatic dysfunction of lumbar region: Secondary | ICD-10-CM | POA: Diagnosis not present

## 2022-02-20 DIAGNOSIS — I2510 Atherosclerotic heart disease of native coronary artery without angina pectoris: Secondary | ICD-10-CM | POA: Diagnosis not present

## 2022-02-20 DIAGNOSIS — I5021 Acute systolic (congestive) heart failure: Secondary | ICD-10-CM | POA: Diagnosis not present

## 2022-02-20 DIAGNOSIS — I493 Ventricular premature depolarization: Secondary | ICD-10-CM | POA: Diagnosis not present

## 2022-02-20 DIAGNOSIS — I10 Essential (primary) hypertension: Secondary | ICD-10-CM | POA: Diagnosis not present

## 2022-02-20 DIAGNOSIS — I447 Left bundle-branch block, unspecified: Secondary | ICD-10-CM | POA: Diagnosis not present

## 2022-02-20 DIAGNOSIS — E782 Mixed hyperlipidemia: Secondary | ICD-10-CM | POA: Diagnosis not present

## 2022-02-20 DIAGNOSIS — R0602 Shortness of breath: Secondary | ICD-10-CM | POA: Diagnosis not present

## 2022-03-05 DIAGNOSIS — I447 Left bundle-branch block, unspecified: Secondary | ICD-10-CM | POA: Diagnosis not present

## 2022-03-05 DIAGNOSIS — I2510 Atherosclerotic heart disease of native coronary artery without angina pectoris: Secondary | ICD-10-CM | POA: Diagnosis not present

## 2022-03-05 DIAGNOSIS — I493 Ventricular premature depolarization: Secondary | ICD-10-CM | POA: Diagnosis not present

## 2022-03-05 DIAGNOSIS — R0602 Shortness of breath: Secondary | ICD-10-CM | POA: Diagnosis not present

## 2022-03-18 DIAGNOSIS — M5136 Other intervertebral disc degeneration, lumbar region: Secondary | ICD-10-CM | POA: Diagnosis not present

## 2022-03-18 DIAGNOSIS — M62830 Muscle spasm of back: Secondary | ICD-10-CM | POA: Diagnosis not present

## 2022-03-18 DIAGNOSIS — M9902 Segmental and somatic dysfunction of thoracic region: Secondary | ICD-10-CM | POA: Diagnosis not present

## 2022-03-18 DIAGNOSIS — M9903 Segmental and somatic dysfunction of lumbar region: Secondary | ICD-10-CM | POA: Diagnosis not present

## 2022-03-19 ENCOUNTER — Other Ambulatory Visit: Payer: Self-pay

## 2022-03-19 DIAGNOSIS — N3943 Post-void dribbling: Secondary | ICD-10-CM

## 2022-03-19 MED ORDER — TAMSULOSIN HCL 0.4 MG PO CAPS: 0.8000 mg | ORAL_CAPSULE | Freq: Every day | ORAL | 0 refills | Status: DC

## 2022-04-03 ENCOUNTER — Other Ambulatory Visit: Payer: Self-pay | Admitting: Urology

## 2022-04-15 DIAGNOSIS — G8929 Other chronic pain: Secondary | ICD-10-CM | POA: Diagnosis not present

## 2022-04-15 DIAGNOSIS — Z79891 Long term (current) use of opiate analgesic: Secondary | ICD-10-CM | POA: Diagnosis not present

## 2022-04-15 DIAGNOSIS — E782 Mixed hyperlipidemia: Secondary | ICD-10-CM | POA: Diagnosis not present

## 2022-04-15 DIAGNOSIS — M545 Low back pain, unspecified: Secondary | ICD-10-CM | POA: Diagnosis not present

## 2022-04-15 DIAGNOSIS — R7303 Prediabetes: Secondary | ICD-10-CM | POA: Diagnosis not present

## 2022-04-15 DIAGNOSIS — I2510 Atherosclerotic heart disease of native coronary artery without angina pectoris: Secondary | ICD-10-CM | POA: Diagnosis not present

## 2022-04-15 DIAGNOSIS — K2100 Gastro-esophageal reflux disease with esophagitis, without bleeding: Secondary | ICD-10-CM | POA: Diagnosis not present

## 2022-04-22 ENCOUNTER — Telehealth: Payer: Self-pay | Admitting: Urology

## 2022-04-22 DIAGNOSIS — E782 Mixed hyperlipidemia: Secondary | ICD-10-CM | POA: Diagnosis not present

## 2022-04-22 DIAGNOSIS — I5022 Chronic systolic (congestive) heart failure: Secondary | ICD-10-CM | POA: Diagnosis not present

## 2022-04-22 DIAGNOSIS — E559 Vitamin D deficiency, unspecified: Secondary | ICD-10-CM | POA: Diagnosis not present

## 2022-04-22 DIAGNOSIS — Z8551 Personal history of malignant neoplasm of bladder: Secondary | ICD-10-CM | POA: Diagnosis not present

## 2022-04-22 DIAGNOSIS — I10 Essential (primary) hypertension: Secondary | ICD-10-CM | POA: Diagnosis not present

## 2022-04-22 DIAGNOSIS — R7303 Prediabetes: Secondary | ICD-10-CM | POA: Diagnosis not present

## 2022-04-22 DIAGNOSIS — I2510 Atherosclerotic heart disease of native coronary artery without angina pectoris: Secondary | ICD-10-CM | POA: Diagnosis not present

## 2022-04-22 DIAGNOSIS — G8929 Other chronic pain: Secondary | ICD-10-CM | POA: Diagnosis not present

## 2022-04-22 DIAGNOSIS — M545 Low back pain, unspecified: Secondary | ICD-10-CM | POA: Diagnosis not present

## 2022-04-22 DIAGNOSIS — Z1211 Encounter for screening for malignant neoplasm of colon: Secondary | ICD-10-CM | POA: Diagnosis not present

## 2022-04-22 DIAGNOSIS — Z79891 Long term (current) use of opiate analgesic: Secondary | ICD-10-CM | POA: Diagnosis not present

## 2022-04-22 NOTE — Telephone Encounter (Signed)
Pt called wanted to know if he can have a urine test, or if Dr. Erlene Quan can order one, to have when he comes in for his PSA draw on Thursday 16th?  Please advise [phone]

## 2022-04-22 NOTE — Telephone Encounter (Signed)
Patient states started having symptoms of frequency, urgency with urination, slight pain when starts to urinate and then resolves. Started late 04/20/22 or possible early in the morning of 04/21/22. Spoke with Dr Erlene Quan and was advised patient needs an appointment, we are not able to just order a UA. Patient scheduled for 04/24/22 with Larene Beach for now, states if sx resolve he will call back to cancel

## 2022-04-23 ENCOUNTER — Other Ambulatory Visit: Payer: Self-pay | Admitting: Family Medicine

## 2022-04-23 DIAGNOSIS — N200 Calculus of kidney: Secondary | ICD-10-CM

## 2022-04-23 NOTE — Progress Notes (Signed)
04/24/2022 6:44 PM   Jeffrey Buchanan [birth year] [id number]  Referring provider: [name], MD [facility] [place],  [place] [id number]  Urological history: 1. BPH with LU TS -PSA (09/2021) 2.33 -cysto (01/2021) - massive prostamegaly with bilobar coaptation elevated bladder neck.  Moderate to severely trabeculated bladder with multiple bladder stones  -PVR 29 mL  -Tamsulosin 0.4 mg daily and finasteride 5 mg daily  2. Bladder cancer -low-grade TCC  -TURBT (2014) + -TURBT (2015) -  3. Bladder stones -s/p Cystolitholapaxy on 01/28/2021 -KUB (04/2022) - possible 6 mm round calcification overlying the midline pelvis   4.  Nephrolithiasis -Stone composition unknown -CT (10/2020) - Bilateral renal calculi. There is a large stone in the right lower pole calyx measuring 2 cm, increased in size from prior exam. Largest stone in the left kidney measures 7 mm in the interpolar region simple cyst in the anterior right kidney is similar to prior exam -KUB (04/2022) - possible 8 mm calcification overlying the left upper quadrant  5. Renal cyst -CT (10/2020) - simple cyst in the anterior right kidney   Chief Complaint  Patient presents with   Urinary Frequency    HPI: Jeffrey Buchanan is a 67 y.o. male who presents today for frequency, urgency with urination, slight pain when starts to urinate and then resolves. started 04/20/22.    He states today he is really having just a slight tinge in his penis when he urinates.  On Sunday, he states he was really symptomatic having to urinate every few minutes with a strong tinge with urination.  Today, he states the feeling in his penis has greatly decreased and is no longer bothersome.  He does not think he passed any stones.    Patient denies any modifying or aggravating factors.  Patient denies any gross hematuria, dysuria or suprapubic/flank pain.  Patient denies any fevers, chills, nausea or  vomiting.    UA yellow clear, specific gravity less than 1.005, 1+ blood, pH 5.0, 0-5 WBCs, 3-10 RBCs, 0-10 epithelial cells and a few bacteria.  KUB 6 mm bladder stone is noted.  Renal shadows not visualized. Possible 8 mm left renal stone and possible right renal stones.    PVR 29 mL    IPSS     Row Name 04/24/22 0900         International Prostate Symptom Score   How often have you had the sensation of not emptying your bladder? Less than half the time     How often have you had to urinate less than every two hours? About half the time     How often have you found you stopped and started again several times when you urinated? Less than half the time     How often have you found it difficult to postpone urination? Less than half the time     How often have you had a weak urinary stream? About half the time     How often have you had to strain to start urination? Less than 1 in 5 times     How many times did you typically get up at night to urinate? 3 Times     Total IPSS Score 16       Quality of Life due to urinary symptoms   If you were to spend the rest of your life with your urinary condition just the way it is now how would you feel about  that? Mixed              Score:  1-7 Mild 8-19 Moderate 20-35 Severe     PMH: Past Medical History:  Diagnosis Date   Acute MI, inferior wall ([place]) 04/06/2003   a.) PCI 04/10/2003 --> 90% pRCA --> 3.5 x 18 mm and 4.0 x 23 mm Vision stents to RCA.   Anxiety    Arthritis    BPH (benign prostatic hyperplasia)    Chronic anticoagulation    a.) DAPT therapy (ASA + clopidogrel)   Chronic back pain    Coronary artery disease 04/10/2003   a.) LHC 04/10/2003 --> EF 40%; 90% pRCA, 40% x 2 mRCA, 30% and 20% pLCx; 3.5 x 18 mm and 4.0 x 23 mm Vision stents to RCA.   DJD (degenerative joint disease)    GERD (gastroesophageal reflux disease)    Headache    History of adenomatous polyp of colon    S/P  PARTIAL COLECTOMY 2006    History of kidney stones    HLD (hyperlipidemia)    Hyperlipidemia    Hypertension    Skin cancer    Urothelial carcinoma of bladder ([place]) 09/14/2012   a.) Bx 09/14/2012 --> pathology (+) for low grade papillary urothelial carcinoma.   Vitamin D deficiency     Surgical History: Past Surgical History:  Procedure Laterality Date   COLONOSCOPY WITH PROPOFOL N/A 03/04/2017   Procedure: COLONOSCOPY WITH PROPOFOL;  Surgeon: Toledo, Benay Pike, MD;  Location: [facility];  Service: Gastroenterology;  Laterality: N/A;   CORONARY ANGIOPLASTY WITH STENT PLACEMENT Left 04/10/2003   Procedure: CORONARY ANGIOPLASTY WITH STENT PLACEMENT (3.5 x 18 mm and 4.0 x 23 mm Vision stents to RCA); Location: [facility]; Surgeon: Serafina Royals, MD   CYSTOSCOPY W/ RETROGRADES Bilateral 09/26/2013   Procedure: CYSTOSCOPY WITH BILATERAL RETROGRADE PYELOGRAM;  Surgeon: Sharyn Creamer, MD;  Location: [facility];  Service: Urology;  Laterality: Bilateral;   CYSTOSCOPY WITH LITHOLAPAXY N/A 06/13/2019   Procedure: CYSTOSCOPY;  Surgeon: Hollice Espy, MD;  Location: [facility];  Service: Urology;  Laterality: N/A;   CYSTOSCOPY WITH LITHOLAPAXY N/A 01/28/2021   Procedure: CYSTOSCOPY WITH LITHOLAPAXY;  Surgeon: Hollice Espy, MD;  Location: [facility];  Service: Urology;  Laterality: N/A;   ESOPHAGOGASTRODUODENOSCOPY (EGD) WITH PROPOFOL N/A 03/04/2017   Procedure: ESOPHAGOGASTRODUODENOSCOPY (EGD) WITH PROPOFOL;  Surgeon: Toledo, Benay Pike, MD;  Location: [facility];  Service: Gastroenterology;  Laterality: N/A;   OPEN REDUCTION SHOULDER DISLOCATION  04/07/1974   PARTIAL COLECTOMY  04/07/2004   STONE EXTRACTION WITH BASKET N/A 06/13/2019   Procedure: STONE EXTRACTION WITH BASKET;  Surgeon: Hollice Espy, MD;  Location: [facility];  Service: Urology;  Laterality: N/A;   TRANSURETHRAL RESECTION OF BLADDER TUMOR N/A 2014   TRANSURETHRAL RESECTION OF BLADDER TUMOR WITH GYRUS (TURBT-GYRUS) N/A 09/26/2013    Procedure: BLADDER BIOPSY;  Surgeon: Sharyn Creamer, MD;  Location: [facility];  Service: Urology;  Laterality: N/A;    Home Medications:  Allergies as of 04/24/2022       Reactions   Cephalosporins Diarrhea   Escitalopram Oxalate Other (See Comments)   lethargic        Medication List        Accurate as of April 24, 2022 11:59 PM. If you have any questions, ask your nurse or doctor.          ALPRAZolam 0.5 MG tablet Commonly known as: XANAX Take 0.5 mg by mouth 2 (two) times daily as needed  for sleep. What changed: Another medication with the same name was removed. Continue taking this medication, and follow the directions you see here. Changed by: Zara Council, PA-C   amiodarone 200 MG tablet Commonly known as: PACERONE Take 200 mg by mouth daily.   aspirin EC 81 MG tablet Take 81 mg by mouth at bedtime.   clopidogrel 75 MG tablet Commonly known as: PLAVIX Take 75 mg by mouth at bedtime.   finasteride 5 MG tablet Commonly known as: PROSCAR TAKE 1 TABLET BY MOUTH EVERY DAY   hydrochlorothiazide 12.5 MG tablet Commonly known as: HYDRODIURIL Take 12.5 mg by mouth at bedtime.   HYDROcodone-acetaminophen 5-325 MG tablet Commonly known as: NORCO/VICODIN Take 1 tablet by mouth 4 (four) times daily as needed (pain.).   ibuprofen 200 MG tablet Commonly known as: ADVIL Take 200 mg by mouth daily.   lisinopril 10 MG tablet Commonly known as: ZESTRIL Take 10 mg by mouth at bedtime.   metoprolol succinate 50 MG 24 hr tablet Commonly known as: TOPROL-XL Take 50 mg by mouth every evening. Take with or immediately following a meal.   rosuvastatin 40 MG tablet Commonly known as: CRESTOR Take 40 mg by mouth at bedtime.   tamsulosin 0.4 MG Caps capsule Commonly known as: FLOMAX Take 2 capsules (0.8 mg total) by mouth at bedtime.   triamcinolone cream 0.1 % Commonly known as: KENALOG Apply 1 application topically 2 (two) times daily as  needed (Rash). For itch on right thigh. Avoid applying to face, groin, and axilla. Use as directed.   Vitamin D3 50 MCG (2000 UT) Tabs Take 2,000 Units by mouth daily.        Allergies:  Allergies  Allergen Reactions   Cephalosporins Diarrhea   Escitalopram Oxalate Other (See Comments)    lethargic    Family History: Family History  Problem Relation Age of Onset   Prostate cancer Brother    Kidney disease Neg Hx     Social History:  reports that he has been smoking cigarettes. He has a 32.00 pack-year smoking history. He has never used smokeless tobacco. He reports that he does not drink alcohol and does not use drugs.  ROS: Pertinent ROS in HPI  Physical Exam: BP 127/71 (BP Location: Left Arm, Patient Position: Sitting, Cuff Size: Large)   Pulse 77   Ht '6\' 2"'$  (1.88 m)   Wt 225 lb (102.1 kg)   BMI 28.89 kg/m   Constitutional:  Well nourished. Alert and oriented, No acute distress. HEENT: [place] AT, moist mucus membranes.  Trachea midline Cardiovascular: No clubbing, cyanosis, or edema. Respiratory: Normal respiratory effort, no increased work of breathing. Neurologic: Grossly intact, no focal deficits, moving all 4 extremities. Psychiatric: Normal mood and affect.  Laboratory Data: WBC (White Blood Cell Count) 4.1 - 10.2 10^3/uL 7.4  RBC (Red Blood Cell Count) 4.69 - 6.13 10^6/uL 5.47  Hemoglobin 14.1 - 18.1 gm/dL 16.8  Hematocrit 40.0 - 52.0 % 52.0  MCV (Mean Corpuscular Volume) 80.0 - 100.0 fl 95.1  MCH (Mean Corpuscular Hemoglobin) 27.0 - 31.2 pg 30.7  MCHC (Mean Corpuscular Hemoglobin Concentration) 32.0 - 36.0 gm/dL 32.3  Platelet Count 150 - 450 10^3/uL 163  RDW-CV (Red Cell Distribution Width) 11.6 - 14.8 % 12.8  MPV (Mean Platelet Volume) 9.4 - 12.4 fl 9.6  Neutrophils 1.50 - 7.80 10^3/uL 4.62  Lymphocytes 1.00 - 3.60 10^3/uL 2.09  Monocytes 0.00 - 1.50 10^3/uL 0.49  Eosinophils 0.00 - 0.55 10^3/uL 0.15  Basophils 0.00 - 0.09  10^3/uL 0.04  Neutrophil %  32.0 - 70.0 % 62.6  Lymphocyte % 10.0 - 50.0 % 28.2  Monocyte % 4.0 - 13.0 % 6.6  Eosinophil % 1.0 - 5.0 % 2.0  Basophil% 0.0 - 2.0 % 0.5  Immature Granulocyte % <=0.7 % 0.1  Immature Granulocyte Count <=0.06 10^3/L 0.01  Resulting Agency  [facility] - LAB   Specimen Collected: 04/15/22 07:46   Performed by: [place]: 04/15/22 08:47  Received From: [facility]  Result Received: 04/22/22 16:08   Glucose 70 - 110 mg/dL 101  Sodium 136 - 145 mmol/L 141  Potassium 3.6 - 5.1 mmol/L 4.3  Chloride 97 - 109 mmol/L 102  Carbon Dioxide (CO2) 22.0 - 32.0 mmol/L 31.6  Urea Nitrogen (BUN) 7 - 25 mg/dL 25  Creatinine 0.7 - 1.3 mg/dL 1.1  Glomerular Filtration Rate (eGFR) >60 mL/min/1.73sq m 74  Comment: CKD-EPI (2021) does not include patient's race in the calculation of eGFR.  Monitoring changes of plasma creatinine and eGFR over time is useful for monitoring kidney function.  Interpretive Ranges for eGFR (CKD-EPI 2021):  eGFR:       >60 mL/min/1.73 sq. m - Normal eGFR:       30-59 mL/min/1.73 sq. m - Moderately Decreased eGFR:       15-29 mL/min/1.73 sq. m  - Severely Decreased eGFR:       < 15 mL/min/1.73 sq. m  - Kidney Failure   Note: These eGFR calculations do not apply in acute situations when eGFR is changing rapidly or patients on dialysis.  Calcium 8.7 - 10.3 mg/dL 10.0  AST 8 - 39 U/L 18  ALT 6 - 57 U/L 19  Alk Phos (alkaline Phosphatase) 34 - 104 U/L 65  Albumin 3.5 - 4.8 g/dL 4.5  Bilirubin, Total 0.3 - 1.2 mg/dL 0.4  Protein, Total 6.1 - 7.9 g/dL 7.3  A/G Ratio 1.0 - 5.0 gm/dL 1.6  Resulting Agency  [facility] - [facility]   Specimen Collected: 04/15/22 07:46   Performed by: [place]: 04/15/22 12:47  Received From: [facility]  Result Received: 04/22/22 16:08    Hemoglobin A1C 4.2 - 5.6 % 6.1 High   Average Blood Glucose (Calc) mg/dL 128  Resulting [facility] - [facility]  Narrative Performed by [facility] - [facility] Normal Range:    4.2 - 5.6% Increased Risk:  5.7 - 6.4% Diabetes:        >= 6.5% Glycemic Control for adults with diabetes:  <7%    Specimen Collected: 04/15/22 07:46   Performed by: [place]: 04/15/22 10:57  Received From: [facility]  Result Received: 04/22/22 16:08   Cholesterol, Total 100 - 200 mg/dL 112  Triglyceride 35 - 199 mg/dL 156  HDL (High Density Lipoprotein) Cholesterol 29.0 - 71.0 mg/dL 33.1  LDL Calculated 0 - 130 mg/dL 48  VLDL Cholesterol mg/dL 31  Cholesterol/HDL Ratio  3.4  Resulting Agency  [facility] - [facility]   Specimen Collected: 04/15/22 07:46   Performed by: [place]: 04/15/22 12:47  Received From: [facility]  Result Received: 04/22/22 16:08   Thyroid Stimulating Hormone (TSH) 0.450-5.330 uIU/ml uIU/mL 1.000  Resulting Agency  [facility] - LAB   Specimen Collected: 04/15/22 07:46   Performed by: [facility]  Resulted: 04/15/22 12:47  Received From: [facility]  Result Received: 04/22/22 16:08   Color Colorless, Straw, Light Yellow, Yellow, Dark Yellow Light Yellow  Clarity Clear Clear  Specific Gravity 1.005 - 1.030 1.028  pH, Urine 5.0 - 8.0 5.0  Protein, Urinalysis Negative mg/dL Trace Abnormal   Glucose, Urinalysis Negative mg/dL Negative  Ketones, Urinalysis Negative mg/dL Negative  Blood, Urinalysis Negative 2+ Abnormal   Nitrite, Urinalysis Negative Negative  Leukocyte Esterase, Urinalysis Negative Trace Abnormal   Bilirubin, Urinalysis Negative Negative  Urobilinogen, Urinalysis 0.2 - 1.0 mg/dL 0.2  WBC, UA <=5 /hpf 4  Red Blood Cells, Urinalysis <=3 /hpf 16 High   Bacteria, Urinalysis 0 - 5 /hpf 0-5  Squamous Epithelial Cells, Urinalysis /hpf 0  Resulting Agency  [facility] - [facility]   Specimen  Collected: 04/15/22 07:37   Performed by: [facility] Last Resulted: 04/15/22 08:40  Received From: [facility]  Result Received: 04/22/22 16:08    Urinalysis See EPIC and HPI I have reviewed the labs.   Pertinent Imaging:  04/24/22 09:01  Scan Result 11m   Narrative & Impression  CLINICAL DATA:  stones   EXAM: ABDOMEN - 1 VIEW   COMPARISON:  CT renal 10/09/2020. x-ray abdomen 04/24/2021 7:57 a.m.   FINDINGS: Limited evaluation due to overlapping osseous structures and overlying soft tissues. Renal shadows not visualized. The bowel gas pattern is normal. Redemonstration of possible 6 mm round calcification overlying the midline pelvis and possible 8 mm calcification overlying the left upper quadrant.   IMPRESSION: 1. Nonobstructive bowel gas pattern. 2. Redemonstration of possible 6 mm round calcification overlying the midline pelvis and possible 8 mm calcification overlying the left upper quadrant. Renal shadows not visualized. Limited evaluation due to overlapping osseous structures and overlying soft tissues.     Electronically Signed   By: MIven FinnM.D.   On: 04/25/2022 01:59  I have independently reviewed the films.  See HPI.   Assessment & Plan:    1. BPH w/ LUTS -Tamsulosin 0.5 mg and finasteride 5 mg daily -I encouraged him to seriously consider undergoing a HoLEP as he will likely continue to have worsening and worsening of his urinary symptoms over time -He continues to be hesitant -UA w/ micro heme -urine sent for culture -if urine culture is negative, we will get him in for repeat PSA -if urine culture is negative, we will treat with culture appropriate antibiotics and then schedule a repeat PSA in two months   2.  Nephrolithiasis -Renal stones appear to be stable on today's KUB although there is stool and gas overlying the renal shadows making it difficult for further evaluation -We did briefly discuss  obtaining a CT scan, but he declined at this time stating he is not very symptomatic  3. Bladder stones -Patient has a 6 mm bladder stone visible on today's KUB which is likely responsible for his symptoms he experienced on Sunday as well as the micro heme on today's UA -He continues to be hesitant to undergo a HoLEP  4. Microscopic hematuria -UA w/ microscopic hematuria -urine culture is pending  -likely secondary to bladder stones, but he has a remote history of bladder cancer and continues to smoke -if urine culture is negative, will need to consider a formal hematuria work up  Return for pending urine culture results .  These notes generated with voice recognition software. I apologize for typographical errors.  Elisse Pennick, PA-C  [facility] [address]  [place], [place] [address] [phone]

## 2022-04-24 ENCOUNTER — Ambulatory Visit
Admission: RE | Admit: 2022-04-24 | Discharge: 2022-04-24 | Disposition: A | Discharge status: Home / Self Care | Payer: PPO | Attending: Urology | Admitting: Urology

## 2022-04-24 ENCOUNTER — Ambulatory Visit: Payer: PPO | Admitting: Urology

## 2022-04-24 ENCOUNTER — Other Ambulatory Visit: Payer: PPO

## 2022-04-24 ENCOUNTER — Encounter: Payer: Self-pay | Admitting: Urology

## 2022-04-24 ENCOUNTER — Ambulatory Visit
Admission: RE | Admit: 2022-04-24 | Discharge: 2022-04-24 | Disposition: A | Discharge status: Home / Self Care | Payer: PPO | Source: Ambulatory Visit | Attending: Urology | Admitting: Urology

## 2022-04-24 VITALS — BP 127/71 | HR 77 | Ht 74.0 in | Wt 225.0 lb

## 2022-04-24 DIAGNOSIS — K8020 Calculus of gallbladder without cholecystitis without obstruction: Secondary | ICD-10-CM | POA: Diagnosis not present

## 2022-04-24 DIAGNOSIS — N210 Calculus in bladder: Secondary | ICD-10-CM

## 2022-04-24 DIAGNOSIS — N401 Enlarged prostate with lower urinary tract symptoms: Secondary | ICD-10-CM | POA: Insufficient documentation

## 2022-04-24 DIAGNOSIS — N200 Calculus of kidney: Secondary | ICD-10-CM | POA: Diagnosis not present

## 2022-04-24 DIAGNOSIS — N3943 Post-void dribbling: Secondary | ICD-10-CM

## 2022-04-24 DIAGNOSIS — R3129 Other microscopic hematuria: Secondary | ICD-10-CM | POA: Diagnosis not present

## 2022-04-24 LAB — MICROSCOPIC EXAMINATION

## 2022-04-24 LAB — URINALYSIS, COMPLETE
Bilirubin, UA: NEGATIVE
Glucose, UA: NEGATIVE
Ketones, UA: NEGATIVE
Leukocytes,UA: NEGATIVE
Nitrite, UA: NEGATIVE
Protein,UA: NEGATIVE
Specific Gravity, UA: <1.005 — ABNORMAL LOW (ref 1.005–1.030)
Urobilinogen, Ur: 0.2 mg/dL (ref 0.2–1.0)
pH, UA: 5 (ref 5.0–7.5)

## 2022-04-24 LAB — BLADDER SCAN AMB NON-IMAGING

## 2022-04-25 ENCOUNTER — Telehealth: Payer: Self-pay

## 2022-04-25 NOTE — Telephone Encounter (Signed)
Spoke with patient. Per patient he was told by Larene Beach not to cancel his appointment on 04/29/22 unless his urine culture came back positive then he would have to wait to re check PSA level because he will be on antibiotics. Appointment was cancelled already though on 04/24/22. Patient said his understanding was someone was going to call him to let him know how to proceed. Advised patient I would check with Larene Beach on this and see when patient is to come back for PSA and follow up with Dr Erlene Quan. Apologized for the confusion.

## 2022-04-25 NOTE — Telephone Encounter (Signed)
Hollice Espy, MD  Kris Mouton, CMA Mr. Bethard cancelled follow up, please reschedule.  Hollice Espy, MD   Left message for patient to call back to reschedule lab for PSA and follow up with Dr Erlene Quan. Patient saw Larene Beach on 04/24/22 for acute issues and follow up appointments were cancelled for some reason.

## 2022-04-27 LAB — CULTURE, URINE COMPREHENSIVE

## 2022-04-28 ENCOUNTER — Telehealth: Payer: Self-pay | Admitting: Family Medicine

## 2022-04-28 DIAGNOSIS — M5136 Other intervertebral disc degeneration, lumbar region: Secondary | ICD-10-CM | POA: Diagnosis not present

## 2022-04-28 DIAGNOSIS — M9903 Segmental and somatic dysfunction of lumbar region: Secondary | ICD-10-CM | POA: Diagnosis not present

## 2022-04-28 DIAGNOSIS — M9902 Segmental and somatic dysfunction of thoracic region: Secondary | ICD-10-CM | POA: Diagnosis not present

## 2022-04-28 DIAGNOSIS — M62830 Muscle spasm of back: Secondary | ICD-10-CM | POA: Diagnosis not present

## 2022-04-28 NOTE — Telephone Encounter (Signed)
-----  Message from Nori Riis, PA-C sent at 04/27/2022  6:52 PM EST ----- Please let Mr. Simien know that his urine culture was negative for infection.  We need to get him rescheduled for a repeat PSA and his appointment with Dr. Erlene Quan.

## 2022-04-28 NOTE — Telephone Encounter (Signed)
Patient notified and voiced understanding. Appointment made for follow up and lab.

## 2022-04-28 NOTE — Telephone Encounter (Signed)
Spoke to patient and informed him that UCX was negative. I have rescheduled patient for PSA, OV with Erlene Quan.

## 2022-04-29 ENCOUNTER — Ambulatory Visit: Payer: PPO | Admitting: Urology

## 2022-05-15 ENCOUNTER — Other Ambulatory Visit: Payer: Self-pay | Admitting: Urology

## 2022-05-15 DIAGNOSIS — N3943 Post-void dribbling: Secondary | ICD-10-CM

## 2022-05-21 DIAGNOSIS — M5136 Other intervertebral disc degeneration, lumbar region: Secondary | ICD-10-CM | POA: Diagnosis not present

## 2022-05-21 DIAGNOSIS — M9903 Segmental and somatic dysfunction of lumbar region: Secondary | ICD-10-CM | POA: Diagnosis not present

## 2022-05-21 DIAGNOSIS — M62830 Muscle spasm of back: Secondary | ICD-10-CM | POA: Diagnosis not present

## 2022-05-21 DIAGNOSIS — M9902 Segmental and somatic dysfunction of thoracic region: Secondary | ICD-10-CM | POA: Diagnosis not present

## 2022-05-29 ENCOUNTER — Other Ambulatory Visit: Payer: Self-pay | Admitting: *Deleted

## 2022-05-29 ENCOUNTER — Other Ambulatory Visit: Payer: PPO

## 2022-05-29 DIAGNOSIS — Z87898 Personal history of other specified conditions: Secondary | ICD-10-CM

## 2022-05-30 LAB — PSA: Prostate Specific Ag, Serum: 2.6 ng/mL (ref 0.0–4.0)

## 2022-06-03 ENCOUNTER — Ambulatory Visit: Payer: PPO | Admitting: Urology

## 2022-06-03 VITALS — BP 123/65 | HR 69 | Ht 74.0 in | Wt 227.4 lb

## 2022-06-03 DIAGNOSIS — N200 Calculus of kidney: Secondary | ICD-10-CM | POA: Diagnosis not present

## 2022-06-03 DIAGNOSIS — N210 Calculus in bladder: Secondary | ICD-10-CM

## 2022-06-03 DIAGNOSIS — N401 Enlarged prostate with lower urinary tract symptoms: Secondary | ICD-10-CM

## 2022-06-03 DIAGNOSIS — N138 Other obstructive and reflux uropathy: Secondary | ICD-10-CM

## 2022-06-03 LAB — BLADDER SCAN AMB NON-IMAGING: Scan Result: 20

## 2022-06-03 NOTE — Progress Notes (Signed)
I,Amy L Pierron,acting as a scribe for Hollice Espy, MD.,have documented all relevant documentation on the behalf of Hollice Espy, MD,as directed by  Hollice Espy, MD while in the presence of Hollice Espy, MD.  06/03/2022 5:26 PM   Jeffrey Buchanan 21-Apr-1955 [id number]  Referring provider: [name], MD [place] [facility] [place],  [place] [address]  Chief Complaint  Patient presents with   Benign Prostatic Hypertrophy    HPI: 67 year-old male with a complicated personal history of massive BPH with recurrent bladder and kidney stones who returns today for a follow up IPSS, PVR, and PSA.   He underwent Cystolitholapaxy on 01/28/2021 for multiple bladder stones. He declined an outlet procedure. He has elected conservative management for his kidney stones. He's being medically treated for his massive BPH with Finasteride and Flomax. His most recent PSA on 05/29/2022 is 2.6 which is stable from two years ago. His most recent KUB on April 24, 2022 shows a six millimeter bladder stone and an eight millimeter kidney stone in the left upper quadrant.   He reports not being bothered by his stones at this time. Back in January he experienced urgency for one full day and slight pain for 2-3 days.   Results for orders placed or performed in visit on 06/03/22  Bladder Scan (Post Void Residual) in office  Result Value Ref Range   Scan Result 20      IPSS     Row Name 06/03/22 1400         International Prostate Symptom Score   How often have you had the sensation of not emptying your bladder? Less than 1 in 5     How often have you had to urinate less than every two hours? Less than half the time     How often have you found you stopped and started again several times when you urinated? Less than 1 in 5 times     How often have you found it difficult to postpone urination? Less than 1 in 5 times     How often have you had a weak urinary stream? Less  than 1 in 5 times     How often have you had to strain to start urination? Not at All     How many times did you typically get up at night to urinate? 2 Times     Total IPSS Score 8       Quality of Life due to urinary symptoms   If you were to spend the rest of your life with your urinary condition just the way it is now how would you feel about that? Mixed            Score:  1-7 Mild 8-19 Moderate 20-35 Severe    PMH: Past Medical History:  Diagnosis Date   Acute MI, inferior wall ([place]) 04/06/2003   a.) PCI 04/10/2003 --> 90% pRCA --> 3.5 x 18 mm and 4.0 x 23 mm Vision stents to RCA.   Anxiety    Arthritis    BPH (benign prostatic hyperplasia)    Chronic anticoagulation    a.) DAPT therapy (ASA + clopidogrel)   Chronic back pain    Coronary artery disease 04/10/2003   a.) LHC 04/10/2003 --> EF 40%; 90% pRCA, 40% x 2 mRCA, 30% and 20% pLCx; 3.5 x 18 mm and 4.0 x 23 mm Vision stents to RCA.   DJD (degenerative joint disease)  GERD (gastroesophageal reflux disease)    Headache    History of adenomatous polyp of colon    S/P  PARTIAL COLECTOMY 2006   History of kidney stones    HLD (hyperlipidemia)    Hyperlipidemia    Hypertension    Skin cancer    Urothelial carcinoma of bladder ([place]) 09/14/2012   a.) Bx 09/14/2012 --> pathology (+) for low grade papillary urothelial carcinoma.   Vitamin D deficiency     Surgical History: Past Surgical History:  Procedure Laterality Date   COLONOSCOPY WITH PROPOFOL N/A 03/04/2017   Procedure: COLONOSCOPY WITH PROPOFOL;  Surgeon: Toledo, Benay Pike, MD;  Location: [facility];  Service: Gastroenterology;  Laterality: N/A;   CORONARY ANGIOPLASTY WITH STENT PLACEMENT Left 04/10/2003   Procedure: CORONARY ANGIOPLASTY WITH STENT PLACEMENT (3.5 x 18 mm and 4.0 x 23 mm Vision stents to RCA); Location: [place]; Surgeon: Serafina Royals, MD   CYSTOSCOPY W/ RETROGRADES Bilateral 09/26/2013   Procedure: CYSTOSCOPY WITH BILATERAL RETROGRADE  PYELOGRAM;  Surgeon: Sharyn Creamer, MD;  Location: [facility]  Service: Urology;  Laterality: Bilateral;   CYSTOSCOPY WITH LITHOLAPAXY N/A 06/13/2019   Procedure: CYSTOSCOPY;  Surgeon: Hollice Espy, MD;  Location: [facility];  Service: Urology;  Laterality: N/A;   CYSTOSCOPY WITH LITHOLAPAXY N/A 01/28/2021   Procedure: CYSTOSCOPY WITH LITHOLAPAXY;  Surgeon: Hollice Espy, MD;  Location: [facility];  Service: Urology;  Laterality: N/A;   ESOPHAGOGASTRODUODENOSCOPY (EGD) WITH PROPOFOL N/A 03/04/2017   Procedure: ESOPHAGOGASTRODUODENOSCOPY (EGD) WITH PROPOFOL;  Surgeon: Toledo, Benay Pike, MD;  Location: [facility];  Service: Gastroenterology;  Laterality: N/A;   OPEN REDUCTION SHOULDER DISLOCATION  04/07/1974   PARTIAL COLECTOMY  04/07/2004   STONE EXTRACTION WITH BASKET N/A 06/13/2019   Procedure: STONE EXTRACTION WITH BASKET;  Surgeon: Hollice Espy, MD;  Location: [facility];  Service: Urology;  Laterality: N/A;   TRANSURETHRAL RESECTION OF BLADDER TUMOR N/A 2014   TRANSURETHRAL RESECTION OF BLADDER TUMOR WITH GYRUS (TURBT-GYRUS) N/A 09/26/2013   Procedure: BLADDER BIOPSY;  Surgeon: Sharyn Creamer, MD;  Location: [facility];  Service: Urology;  Laterality: N/A;    Home Medications:  Allergies as of 06/03/2022       Reactions   Cephalosporins Diarrhea   Escitalopram Oxalate Other (See Comments)   lethargic        Medication List        Accurate as of June 03, 2022  5:26 PM. If you have any questions, ask your nurse or doctor.          STOP taking these medications    triamcinolone cream 0.1 % Commonly known as: KENALOG       TAKE these medications    ALPRAZolam 0.5 MG tablet Commonly known as: XANAX Take 0.5 mg by mouth 2 (two) times daily as needed for sleep.   amiodarone 200 MG tablet Commonly known as: PACERONE Take 200 mg by mouth daily.   aspirin EC 81 MG tablet Take 81 mg by mouth at bedtime.   clopidogrel  75 MG tablet Commonly known as: PLAVIX Take 75 mg by mouth at bedtime.   finasteride 5 MG tablet Commonly known as: PROSCAR TAKE 1 TABLET BY MOUTH EVERY DAY   hydrochlorothiazide 12.5 MG tablet Commonly known as: HYDRODIURIL Take 12.5 mg by mouth at bedtime.   HYDROcodone-acetaminophen 5-325 MG tablet Commonly known as: NORCO/VICODIN Take 1 tablet by mouth 4 (four) times daily as needed (pain.).   ibuprofen 200 MG tablet Commonly known as: ADVIL Take  200 mg by mouth daily.   lisinopril 10 MG tablet Commonly known as: ZESTRIL Take 10 mg by mouth at bedtime.   metoprolol succinate 50 MG 24 hr tablet Commonly known as: TOPROL-XL Take 50 mg by mouth every evening. Take with or immediately following a meal.   rosuvastatin 40 MG tablet Commonly known as: CRESTOR Take 40 mg by mouth at bedtime.   tamsulosin 0.4 MG Caps capsule Commonly known as: FLOMAX TAKE 2 CAPSULES BY MOUTH AT BEDTIME.   Vitamin D3 50 MCG (2000 UT) Tabs Generic drug: Cholecalciferol Take 2,000 Units by mouth daily.        Allergies:  Allergies  Allergen Reactions   Cephalosporins Diarrhea   Escitalopram Oxalate Other (See Comments)    lethargic    Family History: Family History  Problem Relation Age of Onset   Prostate cancer Brother    Kidney disease Neg Hx     Social History:  reports that he has been smoking cigarettes. He has a 32.00 pack-year smoking history. He has never used smokeless tobacco. He reports that he does not drink alcohol and does not use drugs.   Physical Exam: BP 123/65   Pulse 69   Ht '6\' 2"'$  (1.88 m)   Wt 227 lb 6 oz (103.1 kg)   BMI 29.19 kg/m   Constitutional:  Alert and oriented, No acute distress. HEENT: [place] AT, moist mucus membranes.  Trachea midline, no masses. Neurologic: Grossly intact, no focal deficits, moving all 4 extremities. Psychiatric: Normal mood and affect.  Pertinent Imaging:  Abdomen 1 view (KUB)  Narrative CLINICAL DATA:   stones  EXAM: ABDOMEN - 1 VIEW  COMPARISON:  CT renal 10/09/2020. x-ray abdomen 04/24/2021 7:57 a.m.  FINDINGS: Limited evaluation due to overlapping osseous structures and overlying soft tissues. Renal shadows not visualized. The bowel gas pattern is normal. Redemonstration of possible 6 mm round calcification overlying the midline pelvis and possible 8 mm calcification overlying the left upper quadrant.  IMPRESSION: 1. Nonobstructive bowel gas pattern. 2. Redemonstration of possible 6 mm round calcification overlying the midline pelvis and possible 8 mm calcification overlying the left upper quadrant. Renal shadows not visualized. Limited evaluation due to overlapping osseous structures and overlying soft tissues.  Electronically Signed By: Iven Finn M.D. On: 04/25/2022 01:59  Personally reviewed today and agree with radiologic interpretation.    Assessment & Plan:    Recurrent bladder stone  -  We offered Cystolitholapaxy, versus shockwave, versus continued observation.   - He's aware of the increased risk of infections and bladder cancer with chronic stone.  - He's leaning towards shockwave and will let us know if he wants to proceed. If he does, we'll have him return for paperwork and preoperative urine culture.  2. BPH with chronic outlet obstruction  -  Continue Finasteride and Flomax.   - He's emptying well today.  - IPSS shows adequate control.  3. Kidney stone  - Elects conservative management.   - We'll follow with serial KUB.  - Return in one year with KUB or sooner if he elects intervention.  Return in about 1 year (around 06/04/2023) for KUB, IPSS, PVR, PSA.  [facility] [address], [place], [place] [address] [phone]

## 2022-06-04 DIAGNOSIS — E782 Mixed hyperlipidemia: Secondary | ICD-10-CM | POA: Diagnosis not present

## 2022-06-04 DIAGNOSIS — I5022 Chronic systolic (congestive) heart failure: Secondary | ICD-10-CM | POA: Diagnosis not present

## 2022-06-04 DIAGNOSIS — I10 Essential (primary) hypertension: Secondary | ICD-10-CM | POA: Diagnosis not present

## 2022-06-04 DIAGNOSIS — I493 Ventricular premature depolarization: Secondary | ICD-10-CM | POA: Diagnosis not present

## 2022-06-04 DIAGNOSIS — I2510 Atherosclerotic heart disease of native coronary artery without angina pectoris: Secondary | ICD-10-CM | POA: Diagnosis not present

## 2022-06-16 DIAGNOSIS — M9902 Segmental and somatic dysfunction of thoracic region: Secondary | ICD-10-CM | POA: Diagnosis not present

## 2022-06-16 DIAGNOSIS — M62830 Muscle spasm of back: Secondary | ICD-10-CM | POA: Diagnosis not present

## 2022-06-16 DIAGNOSIS — M5136 Other intervertebral disc degeneration, lumbar region: Secondary | ICD-10-CM | POA: Diagnosis not present

## 2022-06-16 DIAGNOSIS — M9903 Segmental and somatic dysfunction of lumbar region: Secondary | ICD-10-CM | POA: Diagnosis not present

## 2022-06-18 DIAGNOSIS — I493 Ventricular premature depolarization: Secondary | ICD-10-CM | POA: Diagnosis not present

## 2022-06-18 DIAGNOSIS — I5022 Chronic systolic (congestive) heart failure: Secondary | ICD-10-CM | POA: Diagnosis not present

## 2022-06-21 IMAGING — CT CT RENAL STONE PROTOCOL
2 of 4 series · 14 of 46 positions shown, 16 images · non-contrast
Comparison: CT 07/27/2019

CLINICAL DATA: Flank pain. Urinary retention. History of kidney
stone.

EXAM:
CT ABDOMEN AND PELVIS WITHOUT CONTRAST
TECHNIQUE: Multidetector CT imaging of the abdomen and pelvis was performed
following the standard protocol without IV contrast.

[Series 2: stone full standard · axial · 0.95mm/px · z∈[-1474,-944]mm · 11 of 126 slices shown, 13 images]
[im 10/126  soft-tissue]
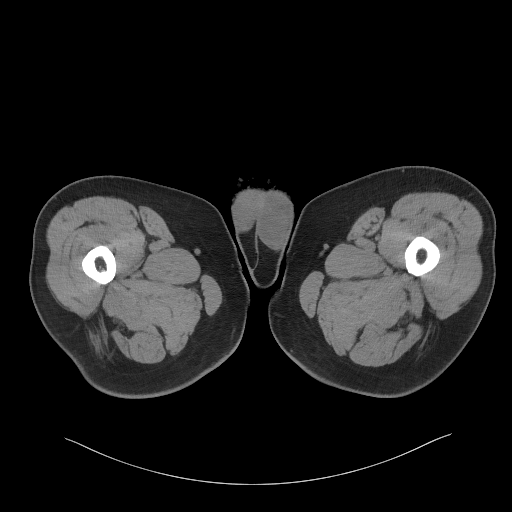
[im 10/126  bone]
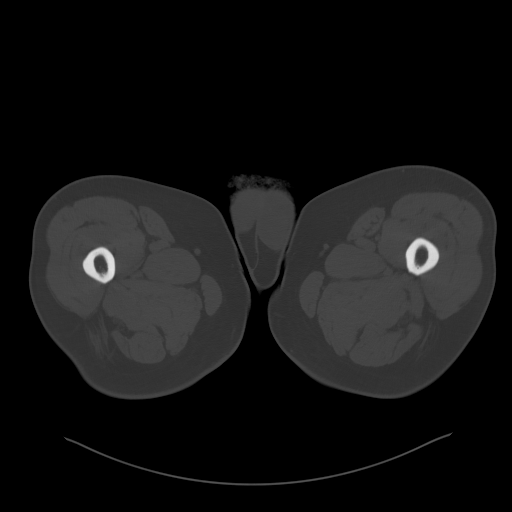
[im 20/126  soft-tissue]
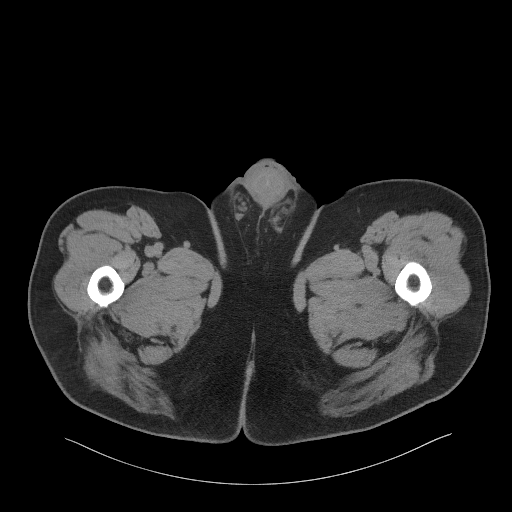
[im 29/126  soft-tissue]
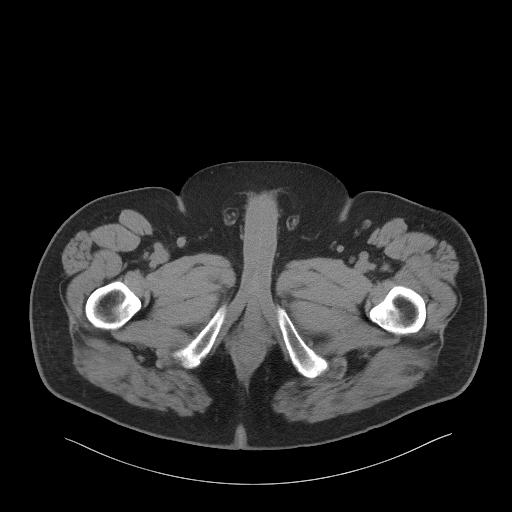
[im 39/126  soft-tissue]
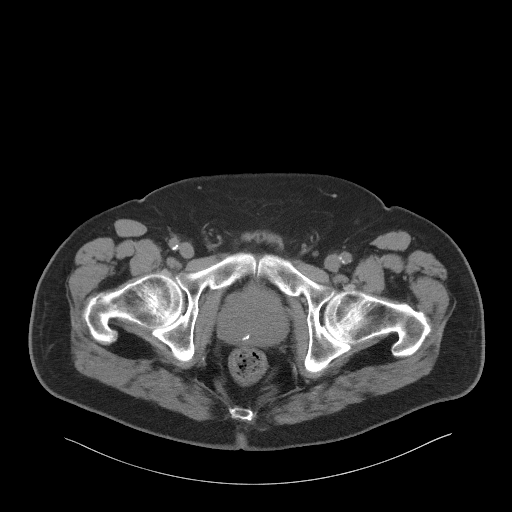
[im 53/126  soft-tissue]
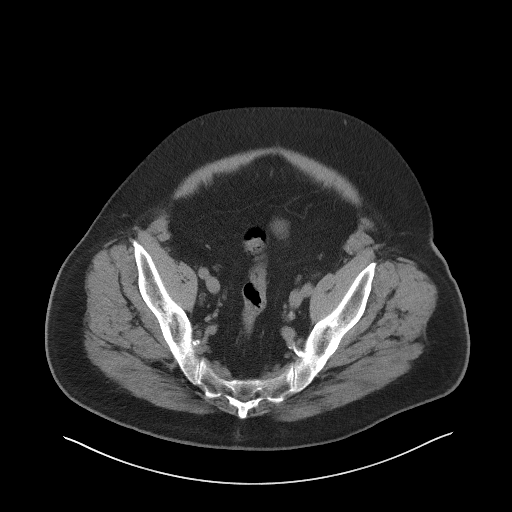
[im 63/126  soft-tissue]
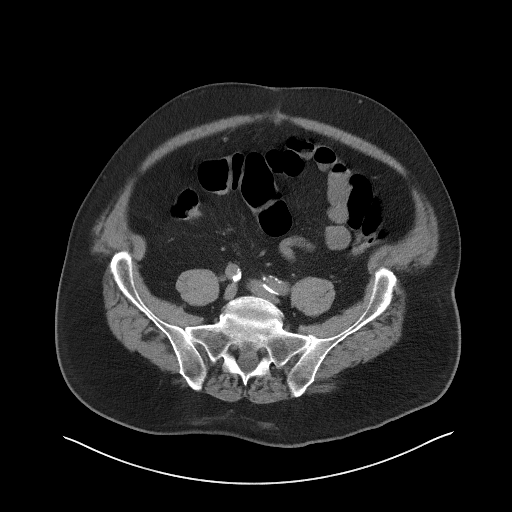
[im 73/126  soft-tissue]
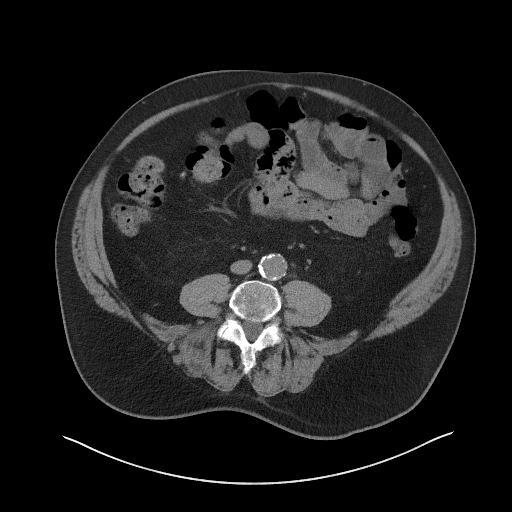
[im 87/126  soft-tissue]
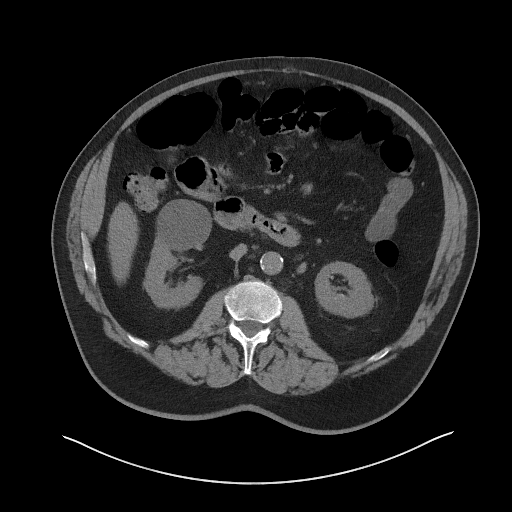
[im 97/126  soft-tissue]
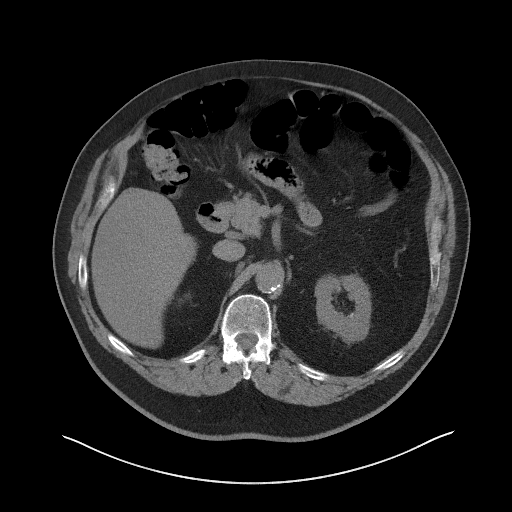
[im 97/126  bone]
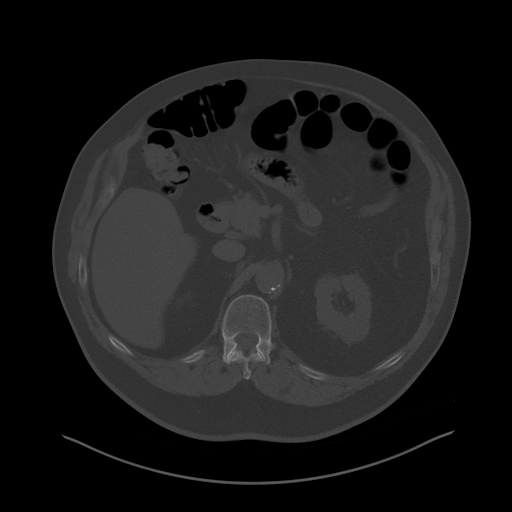
[im 106/126  soft-tissue]
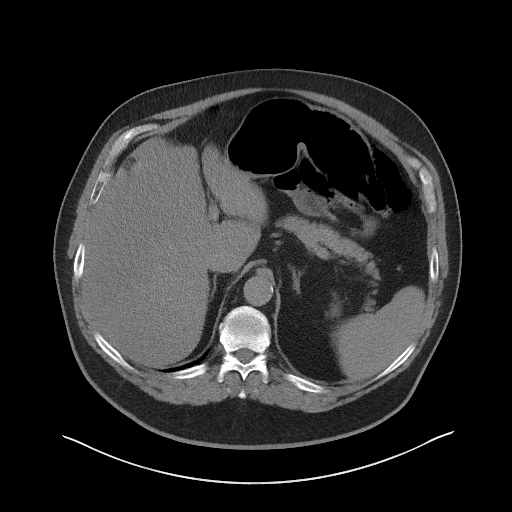
[im 116/126  soft-tissue]
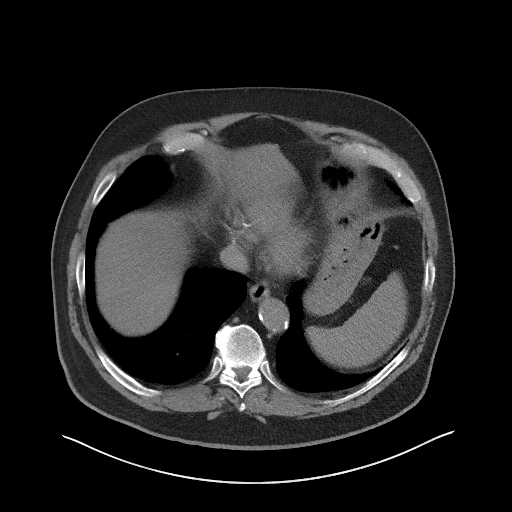

[Series 5: coronal · coronal · 0.87mm/px · 3 of 172 slices shown]
[im 58/172  soft-tissue]
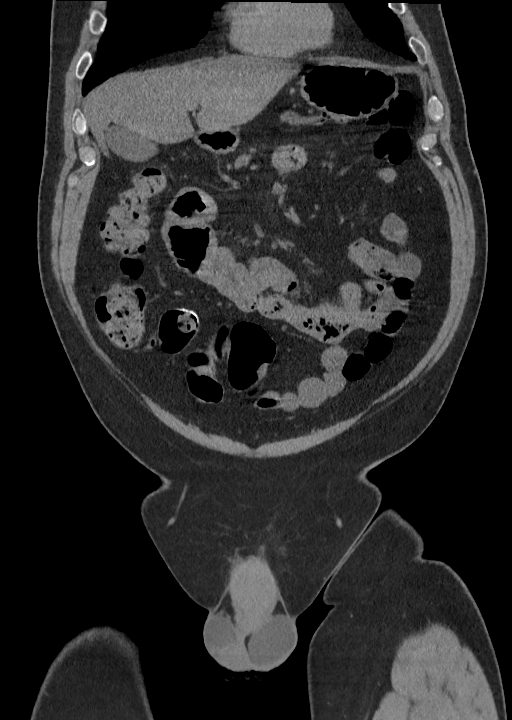
[im 77/172  soft-tissue]
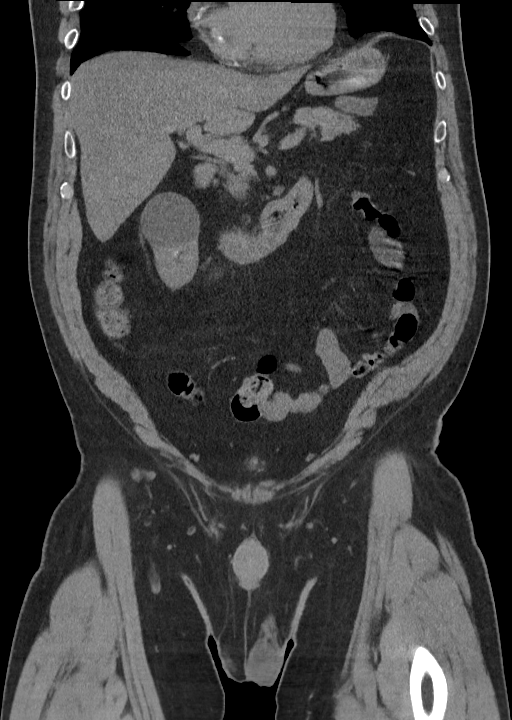
[im 96/172  soft-tissue]
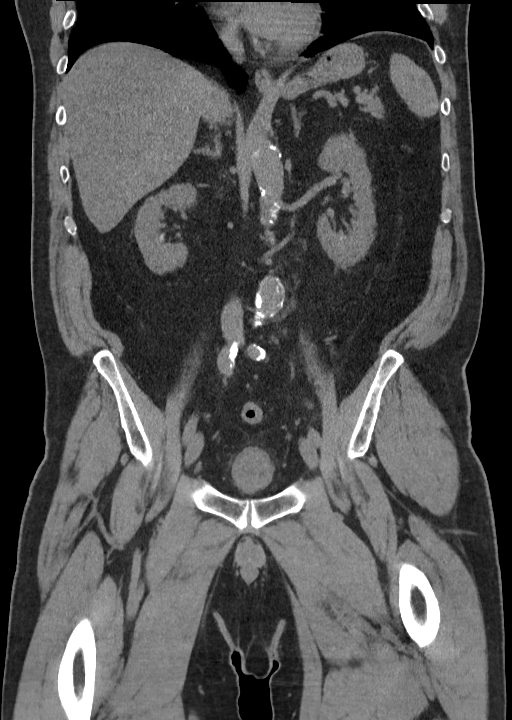

[14 of 46 positions shown; findings below may reference images not displayed]

FINDINGS: Lower chest: No pleural effusion or acute airspace disease. Coronary
artery calcifications, normal heart size.

Hepatobiliary: Diffusely decreased hepatic density typical of
steatosis. Stable subcapsular hypodensity in the right lobe, series
2, image 33, likely cyst. Gallbladder physiologically distended, no
calcified stone. No biliary dilatation.

Pancreas: No ductal dilatation or inflammation.

Spleen: Upper normal in size spanning 13.2 cm. No focal abnormality.

Adrenals/Urinary Tract: Normal adrenal glands. Bilateral renal
calculi. There is a large stone in the right lower pole calyx
measuring 2 cm, increased in size from prior exam. Largest stone in
the left kidney measures 7 mm in the interpolar region simple cyst
in the anterior right kidney is similar to prior exam. The
hypoattenuating lesion in the left kidney on prior contrast-enhanced
CT is grossly stable but not well characterized. Both ureters are
decompressed. There are multiple stones in the urinary bladder. A
least 4 stones are seen in the midline dependently, largest
measuring 10 mm. There is also a punctate calcification at the left
urinary trigone. Urinary bladder is near completely empty with wall
thickening and mild perivesicular fat stranding. No urethral stones
are seen.

Stomach/Bowel: Decompressed stomach. No small bowel obstruction or
inflammation. Normal appendix. The colon is redundant. Chain sutures
are noted in the transverse or redundant sigmoid colon difficult to
delineate due to tortuosity. There is a 15 mm nodule in the central
anterior abdomen that abuts small bowel loops and colon.

Vascular/Lymphatic: Advanced aortic atherosclerosis. Linear
calcification in through the central distal aorta consistent with
chronic dissection, also seen on prior. No periaortic stranding.
Circumaortic left renal vein. Small periportal lymph nodes are
typically reactive.

Reproductive: Enlarged prostate gland spans 6.8 cm transverse.

Other: No free air or ascites. There is a small fat containing
supraumbilical ventral abdominal wall hernia without inflammation.

Musculoskeletal: There are no acute or suspicious osseous
abnormalities.
IMPRESSION: 1. Bilateral nonobstructing intrarenal calculi. Largest stone in the
right lower pole calyx measures 2 cm, increased in size from prior
exam. No hydronephrosis or obstructive uropathy.
2. Multiple stones in the urinary bladder, largest measuring 10 mm.
There is also a punctate calcification at the left urinary trigone.
3. Urinary bladder is near completely empty with wall thickening and
mild perivesicular fat stranding, suggesting cystitis.
4. Enlarged prostate gland.
5. Hepatic steatosis.
6. A 15 mm nodule in the central anterior abdomen abuts small bowel
loops and colon. This may represent a lymph node, however is
nonspecific. Possibility of short segment bowel wall thickening is
also considered. Recommend up-to-date colonoscopy.

Aortic Atherosclerosis ([id number].N).

## 2022-06-25 DIAGNOSIS — I493 Ventricular premature depolarization: Secondary | ICD-10-CM | POA: Diagnosis not present

## 2022-06-26 DIAGNOSIS — I493 Ventricular premature depolarization: Secondary | ICD-10-CM | POA: Diagnosis not present

## 2022-06-26 DIAGNOSIS — I2510 Atherosclerotic heart disease of native coronary artery without angina pectoris: Secondary | ICD-10-CM | POA: Diagnosis not present

## 2022-06-26 DIAGNOSIS — E782 Mixed hyperlipidemia: Secondary | ICD-10-CM | POA: Diagnosis not present

## 2022-06-26 DIAGNOSIS — I10 Essential (primary) hypertension: Secondary | ICD-10-CM | POA: Diagnosis not present

## 2022-06-26 DIAGNOSIS — I5022 Chronic systolic (congestive) heart failure: Secondary | ICD-10-CM | POA: Diagnosis not present

## 2022-07-08 NOTE — Progress Notes (Unsigned)
  Electrophysiology Office Note:    Date:  07/09/2022   ID:  Jeffrey Buchanan, DOB September 17, 1955, MRN [id number]  [place] Cardiologist:  None  [facility] Electrophysiologist:  Vickie Epley, MD   Referring MD: Scheryl Marten, PA-C   Chief Complaint: PVCs  History of Present Illness:    Jeffrey Buchanan is a 67 y.o. male who I am seeing today for an evaluation of frequent PVCs at the request of [name].  The patient has a history of coronary artery disease post PCI to the right coronary, hypertension, hyperlipidemia.  The patient last saw [name] June 26, 2022.  Previous Holter monitor showed a PVC burden of 40% associated with LV systolic dysfunction (EF [id number]).  Repeat Holter monitor after amiodarone showed a PVC burden of 17%.  Today he tells me that he doesn't feel any symptoms related to his PVC's. He tells me that he is really not active other than climbing occasional stairs to get into his apartment.        Their past medical, social and family history was reveiwed.   ROS:   Please see the history of present illness.    All other systems reviewed and are negative.  EKGs/Labs/Other Studies Reviewed:    The following studies were reviewed today:  November 2019 2023 SPECT shows moderate LV systolic dysfunction, dilated LV, no ischemia, inferior scar present  June 05, 2022 Holter monitor report from [facility] reviewed and shows monomorphic PVCs, 17%  June 18, 2022 echo [facility] clinic) EF 25% Normal RV function No significant valvular abnormalities       Physical Exam:    VS:  BP 108/62   Pulse 68   Ht 6\' 2"  (1.88 m)   Wt 227 lb (103 kg)   SpO2 96%   BMI 29.15 kg/m     Wt Readings from Last 3 Encounters:  07/09/22 227 lb (103 kg)  06/03/22 227 lb 6 oz (103.1 kg)  04/24/22 225 lb (102.1 kg)     GEN:  Well nourished, well developed in no acute distress CARDIAC: RRR, no murmurs, rubs, gallops RESPIRATORY:  Clear to  auscultation without rales, wheezing or rhonchi       ASSESSMENT AND PLAN:    1. PVC's (premature ventricular contractions)     #Frequent PVCs Monomorphic.  Likely border zone around inferior scar from prior coronary artery disease/infarct.  Currently taking amiodarone but this has not fully abolished the PVC. I do not think the PVC is the main contributor to his reduced ejection fraction. I suspect his ischemic cardiomyopathy is the main driver. To better clarify this, I have ordered a cardiac MRI. I will plan to follow up with him in clinic after the MRI to review results.   If his EF is < 35%, favor proceeding with ICD for primary prevention.  #Chronic systolic heart failure NYHA class II.  Warm and dry on exam.  Follows with Dr. Saralyn Pilar.  cMR as above. Plan for ICD discussion if EF remains < 35%.  #Coronary artery disease No ischemic symptoms today.  On aspirin and Plavix.    Signed, Hilton Cork. Quentin Ore, MD, [facility], [facility] 07/09/2022 5:37 PM    Electrophysiology [facility]

## 2022-07-09 ENCOUNTER — Encounter: Payer: Self-pay | Admitting: Cardiology

## 2022-07-09 ENCOUNTER — Ambulatory Visit: Payer: PPO | Attending: Cardiology | Admitting: Cardiology

## 2022-07-09 VITALS — BP 108/62 | HR 68 | Ht 74.0 in | Wt 227.0 lb

## 2022-07-09 DIAGNOSIS — I493 Ventricular premature depolarization: Secondary | ICD-10-CM | POA: Diagnosis not present

## 2022-07-09 DIAGNOSIS — I5022 Chronic systolic (congestive) heart failure: Secondary | ICD-10-CM

## 2022-07-09 DIAGNOSIS — I25119 Atherosclerotic heart disease of native coronary artery with unspecified angina pectoris: Secondary | ICD-10-CM

## 2022-07-09 NOTE — Patient Instructions (Addendum)
Medication Instructions:  Your physician recommends that you continue on your current medications as directed. Please refer to the Current Medication list given to you today.  *If you need a refill on your cardiac medications before your next appointment, please call your pharmacy*  Lab Work: CBC You will get your lab work at [facility]) hospital.  Your lab work will be done at the [facility] next to [employer]. These are walk in labs- you will not need an appointment and you do not need to be fasting.    Testing/Procedures: Your physician has requested that you have a cardiac MRI. Cardiac MRI uses a computer to create images of your heart as its beating, producing both still and moving pictures of your heart and major blood vessels. For further information please visit [email or web address] Please follow the instruction sheet given to you today for more information.  Follow-Up: At [facility], you and your health needs are our priority.  As part of our continuing mission to provide you with exceptional heart care, we have created designated Provider Care Teams.  These Care Teams include your primary Cardiologist (physician) and Advanced Practice Providers (APPs -  Physician Assistants and Nurse Practitioners) who all work together to provide you with the care you need, when you need it.  Your next appointment:   8 week(s)  Provider:   Lars Mage, MD      You are scheduled for Cardiac MRI on ______________. Please arrive for your appointment at ______________ ( arrive 30-45 minutes prior to test start time). ?   [facility] [place] [place], [place] [address] [phone] Please take advantage of the free valet parking available at the MAIN entrance. Proceed to [facility] registration for check-in (first floor).  Magnetic resonance imaging (MRI) is a painless test that produces images of the inside of the body without using  Xrays.  During an MRI, strong magnets and radio waves work together in a [employer] officer, political party to form detailed images.   MRI images may provide more details about a medical condition than X-rays, CT scans, and ultrasounds can provide.  You may be given earphones to listen for instructions.  You may eat a light breakfast and take medications as ordered with the exception of furosemide, hydrochlorothiazide, or spironolactone(fluid pill, other). Please avoid stimulants for 12 hr prior to test. (Ie. Caffeine, nicotine, chocolate, or antihistamine medications)  If a contrast material will be used, an IV will be inserted into one of your veins. Contrast material will be injected into your IV. It will leave your body through your urine within a day. You may be told to drink plenty of fluids to help flush the contrast material out of your system.  You will be asked to remove all metal, including: Watch, jewelry, and other metal objects including hearing aids, hair pieces and dentures. Also wearable glucose monitoring systems (ie. Freestyle Libre and Omnipods) (Braces and fillings normally are not a problem.)   TEST WILL TAKE APPROXIMATELY 1 HOUR  PLEASE NOTIFY SCHEDULING AT LEAST 24 HOURS IN ADVANCE IF YOU ARE UNABLE TO KEEP YOUR APPOINTMENT. [phone]  Please call Marchia Bond, cardiac imaging nurse navigator with any questions/concerns. Marchia Bond RN Navigator Cardiac Imaging Gordy Clement RN Navigator Cardiac Imaging [facility] [phone] Office

## 2022-07-14 DIAGNOSIS — M9903 Segmental and somatic dysfunction of lumbar region: Secondary | ICD-10-CM | POA: Diagnosis not present

## 2022-07-14 DIAGNOSIS — M9902 Segmental and somatic dysfunction of thoracic region: Secondary | ICD-10-CM | POA: Diagnosis not present

## 2022-07-14 DIAGNOSIS — M5136 Other intervertebral disc degeneration, lumbar region: Secondary | ICD-10-CM | POA: Diagnosis not present

## 2022-07-14 DIAGNOSIS — M62830 Muscle spasm of back: Secondary | ICD-10-CM | POA: Diagnosis not present

## 2022-07-17 DIAGNOSIS — Z8551 Personal history of malignant neoplasm of bladder: Secondary | ICD-10-CM | POA: Diagnosis not present

## 2022-07-17 DIAGNOSIS — I10 Essential (primary) hypertension: Secondary | ICD-10-CM | POA: Diagnosis not present

## 2022-07-17 DIAGNOSIS — I2510 Atherosclerotic heart disease of native coronary artery without angina pectoris: Secondary | ICD-10-CM | POA: Diagnosis not present

## 2022-07-17 DIAGNOSIS — Z79891 Long term (current) use of opiate analgesic: Secondary | ICD-10-CM | POA: Diagnosis not present

## 2022-07-17 DIAGNOSIS — R7303 Prediabetes: Secondary | ICD-10-CM | POA: Diagnosis not present

## 2022-07-17 DIAGNOSIS — E782 Mixed hyperlipidemia: Secondary | ICD-10-CM | POA: Diagnosis not present

## 2022-07-17 DIAGNOSIS — I5022 Chronic systolic (congestive) heart failure: Secondary | ICD-10-CM | POA: Diagnosis not present

## 2022-07-23 DIAGNOSIS — I10 Essential (primary) hypertension: Secondary | ICD-10-CM | POA: Diagnosis not present

## 2022-07-23 DIAGNOSIS — Z79891 Long term (current) use of opiate analgesic: Secondary | ICD-10-CM | POA: Diagnosis not present

## 2022-07-23 DIAGNOSIS — Z125 Encounter for screening for malignant neoplasm of prostate: Secondary | ICD-10-CM | POA: Diagnosis not present

## 2022-07-23 DIAGNOSIS — I5022 Chronic systolic (congestive) heart failure: Secondary | ICD-10-CM | POA: Diagnosis not present

## 2022-07-23 DIAGNOSIS — E782 Mixed hyperlipidemia: Secondary | ICD-10-CM | POA: Diagnosis not present

## 2022-07-23 DIAGNOSIS — I2510 Atherosclerotic heart disease of native coronary artery without angina pectoris: Secondary | ICD-10-CM | POA: Diagnosis not present

## 2022-07-23 DIAGNOSIS — M545 Low back pain, unspecified: Secondary | ICD-10-CM | POA: Diagnosis not present

## 2022-07-23 DIAGNOSIS — G8929 Other chronic pain: Secondary | ICD-10-CM | POA: Diagnosis not present

## 2022-07-23 DIAGNOSIS — E559 Vitamin D deficiency, unspecified: Secondary | ICD-10-CM | POA: Diagnosis not present

## 2022-07-23 DIAGNOSIS — R7303 Prediabetes: Secondary | ICD-10-CM | POA: Diagnosis not present

## 2022-08-11 ENCOUNTER — Telehealth: Payer: Self-pay | Admitting: Cardiology

## 2022-08-11 NOTE — Telephone Encounter (Signed)
  Pt would like to let Dr. Lalla Brothers know. He did his CBC at Dr. Doristine Church Klein's office.

## 2022-08-11 NOTE — Telephone Encounter (Signed)
Labs are in patient's chart  

## 2022-08-19 DIAGNOSIS — M9902 Segmental and somatic dysfunction of thoracic region: Secondary | ICD-10-CM | POA: Diagnosis not present

## 2022-08-19 DIAGNOSIS — M62830 Muscle spasm of back: Secondary | ICD-10-CM | POA: Diagnosis not present

## 2022-08-19 DIAGNOSIS — M9903 Segmental and somatic dysfunction of lumbar region: Secondary | ICD-10-CM | POA: Diagnosis not present

## 2022-08-19 DIAGNOSIS — M5136 Other intervertebral disc degeneration, lumbar region: Secondary | ICD-10-CM | POA: Diagnosis not present

## 2022-09-02 ENCOUNTER — Telehealth (HOSPITAL_COMMUNITY): Payer: Self-pay | Admitting: *Deleted

## 2022-09-02 NOTE — Telephone Encounter (Signed)
Reaching out to patient to offer assistance regarding upcoming cardiac imaging study; pt verbalizes understanding of appt date/time, parking situation and where to check in, pre-test NPO status, and verified current allergies; name and call back number provided for further questions should they arise  Larey Brick RN Navigator Cardiac Imaging [facility] [phone] office [phone] cell  Patient has a Xanax prescription. He is aware that he is to have a ride should he decide to take a tablet. He reports having MRIs in the past without incident.

## 2022-09-03 ENCOUNTER — Ambulatory Visit: Payer: PPO | Admitting: Cardiology

## 2022-09-03 ENCOUNTER — Ambulatory Visit
Admission: RE | Admit: 2022-09-03 | Discharge: 2022-09-03 | Disposition: A | Discharge status: Home / Self Care | Payer: PPO | Source: Ambulatory Visit | Attending: Cardiology | Admitting: Cardiology

## 2022-09-03 ENCOUNTER — Other Ambulatory Visit: Payer: Self-pay | Admitting: Cardiology

## 2022-09-03 DIAGNOSIS — I493 Ventricular premature depolarization: Secondary | ICD-10-CM | POA: Diagnosis not present

## 2022-09-03 MED ORDER — GADOBUTROL 1 MMOL/ML IV SOLN
13.0000 mL | Freq: Once | INTRAVENOUS | Status: AC | PRN
  Administered 2022-09-03: 13 mL via INTRAVENOUS

## 2022-09-04 ENCOUNTER — Ambulatory Visit: Payer: PPO | Admitting: Dermatology

## 2022-09-04 ENCOUNTER — Encounter: Payer: Self-pay | Admitting: Dermatology

## 2022-09-04 VITALS — BP 131/71 | HR 55

## 2022-09-04 DIAGNOSIS — D485 Neoplasm of uncertain behavior of skin: Secondary | ICD-10-CM | POA: Diagnosis not present

## 2022-09-04 DIAGNOSIS — L821 Other seborrheic keratosis: Secondary | ICD-10-CM | POA: Diagnosis not present

## 2022-09-04 DIAGNOSIS — D492 Neoplasm of unspecified behavior of bone, soft tissue, and skin: Secondary | ICD-10-CM

## 2022-09-04 DIAGNOSIS — L814 Other melanin hyperpigmentation: Secondary | ICD-10-CM | POA: Diagnosis not present

## 2022-09-04 DIAGNOSIS — B078 Other viral warts: Secondary | ICD-10-CM | POA: Diagnosis not present

## 2022-09-04 DIAGNOSIS — L578 Other skin changes due to chronic exposure to nonionizing radiation: Secondary | ICD-10-CM

## 2022-09-04 DIAGNOSIS — X32XXXA Exposure to sunlight, initial encounter: Secondary | ICD-10-CM

## 2022-09-04 DIAGNOSIS — W908XXA Exposure to other nonionizing radiation, initial encounter: Secondary | ICD-10-CM | POA: Diagnosis not present

## 2022-09-04 NOTE — Progress Notes (Signed)
Follow-Up Visit   Subjective  Jeffrey Buchanan is a 67 y.o. male who presents for the following: Spot on nose. Bleeding. Dur: several days. Stinging.   He also has a spot at his eyelid and spots at his face.   The patient has spots, moles and lesions to be evaluated, some may be new or changing and the patient has concerns that these could be cancer.   The following portions of the chart were reviewed this encounter and updated as appropriate: medications, allergies, medical history  Review of Systems:  No other skin or systemic complaints except as noted in HPI or Assessment and Plan.  Objective  Well appearing patient in no apparent distress; mood and affect are within normal limits.  A focused examination was performed of the following areas: Face, nose  Relevant physical exam findings are noted in the Assessment and Plan.  Right Nasal Sidewall x1 Verrucous papule with hemorrhagic crust  Left Cheek 0.4 cm dark brown and skin colored macule with clumped pigment     Left Upper Eyelid 0.3 cm pink papule at left upper medial eyelid margin.        Assessment & Plan   Other viral warts Right Nasal Sidewall x1  Favor traumatized verruca over other. Discussed option of biopsy today to be sure, but patient happy with trial of cryotherapy and RTC if this grows instead of clearing up.  Viral Wart (HPV) Counseling  Discussed viral / HPV (Human Papilloma Virus) etiology and risk of spread /infectivity to other areas of body as well as to other people.  Multiple treatments and methods may be required to clear warts and it is possible treatment may not be successful.  Treatment risks include discoloration; scarring and there is still potential for wart recurrence.   Consider biopsy at follow-up if indicated. Return sooner if growing or changing/not resolving.   Prior to procedure, discussed risks of blister formation, small wound, skin dyspigmentation, or rare scar  following cryotherapy. Recommend Vaseline ointment to treated areas while healing.   Destruction of lesion - Right Nasal Sidewall x1  Destruction method: cryotherapy   Informed consent: discussed and consent obtained   Lesion destroyed using liquid nitrogen: Yes   Region frozen until ice ball extended beyond lesion: Yes   Outcome: patient tolerated procedure well with no complications   Post-procedure details: wound care instructions given   Additional details:  Prior to procedure, discussed risks of blister formation, small wound, skin dyspigmentation, or rare scar following cryotherapy. Recommend Vaseline ointment to treated areas while healing.   Neoplasm of skin (2) Left Cheek  Skin / nail biopsy Type of biopsy: tangential   Informed consent: discussed and consent obtained   Anesthesia: the lesion was anesthetized in a standard fashion   Anesthesia comment:  Area prepped with alcohol Anesthetic:  1% lidocaine w/ epinephrine 1-100,000 buffered w/ 8.4% NaHCO3 Instrument used: flexible razor blade   Hemostasis achieved with: pressure, aluminum chloride and electrodesiccation   Outcome: patient tolerated procedure well   Post-procedure details: wound care instructions given   Post-procedure details comment:  Ointment and small bandage applied  Specimen 1 - Surgical pathology Differential Diagnosis: R/O atypia vs pigment incontinence   Check Margins: No  Left Upper Eyelid  Ambulatory referral to Ophthalmology  Left upper eyelid margin - Cyst vs BCC vs other at left upper medial eyelid margin.  Recommend evaluation by ophthalmologist.   SEBORRHEIC KERATOSIS - Stuck-on, waxy, tan-brown papules and/or plaques  - Benign-appearing - Discussed  benign etiology and prognosis. - Observe - Call for any changes  ACTINIC DAMAGE - chronic, secondary to cumulative UV radiation exposure/sun exposure over time - diffuse scaly erythematous macules with underlying dyspigmentation -  Recommend daily broad spectrum sunscreen SPF 30+ to sun-exposed areas, reapply every 2 hours as needed.  - Recommend staying in the shade or wearing long sleeves, sun glasses (UVA+UVB protection) and wide brim hats (4-inch brim around the entire circumference of the hat). - Call for new or changing lesions. - Recommend taking Heliocare sun protection supplement daily in sunny weather for additional sun protection. For maximum protection on the sunniest days, you can take up to 2 capsules of regular Heliocare OR take 1 capsule of Heliocare Ultra.    Return for Wart Follow UP in 2-3 months.  I, Lawson Radar, CMA, am acting as scribe for Darden Dates, MD.   Documentation: I have reviewed the above documentation for accuracy and completeness, and I agree with the above.  Darden Dates, MD

## 2022-09-04 NOTE — Patient Instructions (Addendum)
Cryotherapy Aftercare  Wash gently with soap and water everyday.   Apply Vaseline and Band-Aid daily until healed.    Wound Care Instructions  Cleanse wound gently with soap and water once a day then pat dry with clean gauze. Apply a thin coat of Petrolatum (petroleum jelly, "Vaseline") over the wound (unless you have an allergy to this). We recommend that you use a new, sterile tube of Vaseline. Do not pick or remove scabs. Do not remove the yellow or white "healing tissue" from the base of the wound.  Cover the wound with fresh, clean, nonstick gauze and secure with paper tape. You may use Band-Aids in place of gauze and tape if the wound is small enough, but would recommend trimming much of the tape off as there is often too much. Sometimes Band-Aids can irritate the skin.  You should call the office for your biopsy report after 1 week if you have not already been contacted.  If you experience any problems, such as abnormal amounts of bleeding, swelling, significant bruising, significant pain, or evidence of infection, please call the office immediately.  FOR ADULT SURGERY PATIENTS: If you need something for pain relief you may take 1 extra strength Tylenol (acetaminophen) AND 2 Ibuprofen (200mg each) together every 4 hours as needed for pain. (do not take these if you are allergic to them or if you have a reason you should not take them.) Typically, you may only need pain medication for 1 to 3 days.    Recommend daily broad spectrum sunscreen SPF 30+ to sun-exposed areas, reapply every 2 hours as needed. Call for new or changing lesions.  Staying in the shade or wearing long sleeves, sun glasses (UVA+UVB protection) and wide brim hats (4-inch brim around the entire circumference of the hat) are also recommended for sun protection.    Recommend taking Heliocare sun protection supplement daily in sunny weather for additional sun protection. For maximum protection on the sunniest days, you can  take up to 2 capsules of regular Heliocare OR take 1 capsule of Heliocare Ultra. For prolonged exposure (such as a full day in the sun), you can repeat your dose of the supplement 4 hours after your first dose. Heliocare can be purchased at [facility], at some [facility] or at www.heliocare.com.    Due to recent changes in healthcare laws, you may see results of your pathology and/or laboratory studies on MyChart before the doctors have had a chance to review them. We understand that in some cases there may be results that are confusing or concerning to you. Please understand that not all results are received at the same time and often the doctors may need to interpret multiple results in order to provide you with the best plan of care or course of treatment. Therefore, we ask that you please give us 2 business days to thoroughly review all your results before contacting the office for clarification. Should we see a critical lab result, you will be contacted sooner.   If You Need Anything After Your Visit  If you have any questions or concerns for your doctor, please call our main line at [phone] and press option 4 to reach your doctor's medical assistant. If no one answers, please leave a voicemail as directed and we will return your call as soon as possible. Messages left after 4 pm will be answered the following business day.   You may also send us a message via MyChart. We typically respond to MyChart   messages within 1-2 business days.  For prescription refills, please ask your pharmacy to contact our office. Our fax number is [phone].  If you have an urgent issue when the clinic is closed that cannot wait until the next business day, you can page your doctor at the number below.    Please note that while we do our best to be available for urgent issues outside of office hours, we are not available 24/7.   If you have an urgent issue and are unable to reach us, you may choose  to seek medical care at your doctor's office, retail clinic, urgent care center, or emergency room.  If you have a medical emergency, please immediately call 911 or go to the emergency department.  Pager Numbers  - Dr. Kowalski: [phone]  - Dr. Moye: [phone]  - Dr. Stewart: [phone]  In the event of inclement weather, please call our main line at [phone] for an update on the status of any delays or closures.  Dermatology Medication Tips: Please keep the boxes that topical medications come in in order to help keep track of the instructions about where and how to use these. Pharmacies typically print the medication instructions only on the boxes and not directly on the medication tubes.   If your medication is too expensive, please contact our office at [phone] option 4 or send us a message through MyChart.   We are unable to tell what your co-pay for medications will be in advance as this is different depending on your insurance coverage. However, we may be able to find a substitute medication at lower cost or fill out paperwork to get insurance to cover a needed medication.   If a prior authorization is required to get your medication covered by your insurance company, please allow us 1-2 business days to complete this process.  Drug prices often vary depending on where the prescription is filled and some pharmacies may offer cheaper prices.  The website www.goodrx.com contains coupons for medications through different pharmacies. The prices here do not account for what the cost may be with help from insurance (it may be cheaper with your insurance), but the website can give you the price if you did not use any insurance.  - You can print the associated coupon and take it with your prescription to the pharmacy.  - You may also stop by our office during regular business hours and pick up a GoodRx coupon card.  - If you need your prescription sent electronically to a  different pharmacy, notify our office through [place] MyChart or by phone at [phone] option 4.     Si Usted Necesita Algo Despus de Su Visita  Tambin puede enviarnos un mensaje a travs de MyChart. Por lo general respondemos a los mensajes de MyChart en el transcurso de 1 a 2 das hbiles.  Para renovar recetas, por favor pida a su farmacia que se ponga en contacto con nuestra oficina. Nuestro nmero de fax es el [phone].  Si tiene un asunto urgente cuando la clnica est cerrada y que no puede esperar hasta el siguiente da hbil, puede llamar/localizar a su doctor(a) al nmero que aparece a continuacin.   Por favor, tenga en cuenta que aunque hacemos todo lo posible para estar disponibles para asuntos urgentes fuera del horario de oficina, no estamos disponibles las 24 horas del da, los 7 das de la semana.   Si tiene un problema urgente y no puede comunicarse con nosotros, puede optar   por buscar atencin mdica  en el consultorio de su doctor(a), en una clnica privada, en un centro de atencin urgente o en una sala de emergencias.  Si tiene una emergencia mdica, por favor llame inmediatamente al 911 o vaya a la sala de emergencias.  Nmeros de bper  - Dr. Kowalski: [phone]  - Dra. Moye: [phone]  - Dra. Stewart: [phone]  En caso de inclemencias del tiempo, por favor llame a nuestra lnea principal al [phone] para una actualizacin sobre el estado de cualquier retraso o cierre.  Consejos para la medicacin en dermatologa: Por favor, guarde las cajas en las que vienen los medicamentos de uso tpico para ayudarle a seguir las instrucciones sobre dnde y cmo usarlos. Las farmacias generalmente imprimen las instrucciones del medicamento slo en las cajas y no directamente en los tubos del medicamento.   Si su medicamento es muy caro, por favor, pngase en contacto con nuestra oficina llamando al [phone] y presione la opcin 4 o envenos un  mensaje a travs de MyChart.   No podemos decirle cul ser su copago por los medicamentos por adelantado ya que esto es diferente dependiendo de la cobertura de su seguro. Sin embargo, es posible que podamos encontrar un medicamento sustituto a menor costo o llenar un formulario para que el seguro cubra el medicamento que se considera necesario.   Si se requiere una autorizacin previa para que su compaa de seguros cubra su medicamento, por favor permtanos de 1 a 2 das hbiles para completar este proceso.  Los precios de los medicamentos varan con frecuencia dependiendo del lugar de dnde se surte la receta y alguna farmacias pueden ofrecer precios ms baratos.  El sitio web www.goodrx.com tiene cupones para medicamentos de diferentes farmacias. Los precios aqu no tienen en cuenta lo que podra costar con la ayuda del seguro (puede ser ms barato con su seguro), pero el sitio web puede darle el precio si no utiliz ningn seguro.  - Puede imprimir el cupn correspondiente y llevarlo con su receta a la farmacia.  - Tambin puede pasar por nuestra oficina durante el horario de atencin regular y recoger una tarjeta de cupones de GoodRx.  - Si necesita que su receta se enve electrnicamente a una farmacia diferente, informe a nuestra oficina a travs de MyChart de [place] o por telfono llamando al [phone] y presione la opcin 4.  

## 2022-09-08 ENCOUNTER — Encounter: Payer: Self-pay | Admitting: Cardiology

## 2022-09-11 ENCOUNTER — Telehealth: Payer: Self-pay

## 2022-09-11 NOTE — Telephone Encounter (Signed)
-----   Message from [place], MD sent at 09/11/2022 10:24 AM EDT ----- Skin , left cheek LENTIGO --> "sun spot", normal under the microscope, no cancer, no treatment needed.   MAs please call. Thank you!

## 2022-09-11 NOTE — Telephone Encounter (Signed)
Patient informed of pathology results 

## 2022-09-16 DIAGNOSIS — M5136 Other intervertebral disc degeneration, lumbar region: Secondary | ICD-10-CM | POA: Diagnosis not present

## 2022-09-16 DIAGNOSIS — M9902 Segmental and somatic dysfunction of thoracic region: Secondary | ICD-10-CM | POA: Diagnosis not present

## 2022-09-16 DIAGNOSIS — M9903 Segmental and somatic dysfunction of lumbar region: Secondary | ICD-10-CM | POA: Diagnosis not present

## 2022-09-16 DIAGNOSIS — M62830 Muscle spasm of back: Secondary | ICD-10-CM | POA: Diagnosis not present

## 2022-09-23 NOTE — Progress Notes (Unsigned)
Electrophysiology Office Follow up Visit Note:    Date:  09/24/2022   ID:  Jeffrey Buchanan, DOB 06-10-1955, MRN [id number]  PCP:  Jeffrey Ferrier, MD  [facility] Cardiologist:  None  [facility] Electrophysiologist:  Jeffrey Prude, MD    Interval History:    Jeffrey Buchanan is a 67 y.o. male who presents for a follow up visit.   Last seen 07/09/2022.  From that note: "The patient has a history of coronary artery disease post PCI to the right coronary, hypertension, hyperlipidemia. The patient last saw Jeffrey Buchanan June 26, 2022. Previous Holter monitor showed a PVC burden of 40% associated with LV systolic dysfunction (EF 25%). Repeat Holter monitor after amiodarone showed a PVC burden of 17%."  At that appointment we planned for a cardiac MRI for further risk stratification. We discussed ICD implant.  Today he tells me he has not felt any PVCs.  No syncope or presyncope.  His PCI was in 2005 to the right coronary artery.       Past medical, surgical, social and family history were reviewed.  ROS:   Please see the history of present illness.    All other systems reviewed and are negative.  EKGs/Labs/Other Studies Reviewed:    The following studies were reviewed today:  09/03/2022 cMR IMPRESSION: 1. Moderate to severely reduced LV systolic function (LVEF = 33%).  2. Subendocardial scar in the basal-mid inferoseptal and inferior LV walls.  3. Scar/LGE comprises 16.5g (about 9%) of total myocardial mass.  4. Global hypokinesis with severe hypokinesis of the basal-mid inferoseptal and inferior wall.  5. Normal RV size and function.  6. Findings suggest prior basal-mid inferoseptal and inferior LV infarct (RCA territory).   07/17/2022 Labwork (CareEverywhere) AST 17 ALT 18  2/28/20224 ECG - LBBB, sinus w Frequent PVC's      EKG:  The ekg ordered today demonstrates sinus rhythm, left bundle branch block with a QRS duration of 180 ms.  No  PVCs.   Physical Exam:    VS:  BP 118/70 (BP Location: Left Arm, Patient Position: Sitting, Cuff Size: Normal)   Pulse 62   Ht 6\' 2"  (1.88 m)   Wt 229 lb (103.9 kg)   SpO2 97%   BMI 29.40 kg/m     Wt Readings from Last 3 Encounters:  09/24/22 229 lb (103.9 kg)  07/09/22 227 lb (103 kg)  06/03/22 227 lb 6 oz (103.1 kg)     GEN:  Well nourished, well developed in no acute distress CARDIAC: RRR, no murmurs, rubs, gallops RESPIRATORY:  Clear to auscultation without rales, wheezing or rhonchi       ASSESSMENT:    1. Chronic systolic heart failure (HCC)   2. Coronary artery disease involving native coronary artery of native heart with angina pectoris (HCC)   3. PVC's (premature ventricular contractions)    PLAN:    In order of problems listed above:  #Chronic systolic heart failure #ICM #CAD NYHA II. Warm and dry on exam. On GDMT. EF 33% on cMR. Large scar. We discussed his risk of sudden cardiac death during today's appointment given the cMR findings. I have recommended ICD.  Given a wide left bundle branch block on EKG, favor a CRT-D implant.  The patient has an ischemic CM (EF 33%), NYHA Class III CHF, and CAD.  He is referred by Jeffrey Buchanan for risk stratification of sudden death and consideration of ICD implantation.  At this time, he meets criteria  for ICD implantation for primary prevention of sudden death.  I have had a thorough discussion with the patient reviewing options.  The patient and their family (if available) have had opportunities to ask questions and have them answered.  The patient would like some time to think about his options before finalizing his decision which is very reasonable.    Risks, benefits, alternatives to ICD implantation were discussed in detail with the patient today. The patient understands that the risks include but are not limited to bleeding, infection, pneumothorax, perforation, tamponade, vascular damage, renal failure, MI, stroke,  death, inappropriate shocks, and lead dislodgement and wishes to proceed.    If he elects to proceed, plan for him to stop his Plavix 5 days prior to the implant (PCI was in 2005).  He will continue aspirin 81 mg by mouth once daily uninterrupted.   #PVC's Seem to be resolved based on today's EKG and history.  I suspect it is taken time for his amiodarone level to become therapeutic.  For now, I would not recommend PVC ablation given suppression of PVCs on amiodarone.  If, in the future, we decided to stop amiodarone and he were to have a recurrence, could discuss catheter ablation versus restarting antiarrhythmic.  I will have him wear a 1 week ZIO monitor before his follow-up appointment in 3 months with Jeffrey Buchanan.  This will help [place] reassess the burden of PVCs.   Follow-up with Jeffrey Buchanan in 3 months with 1 week ZIO monitor completed and resulted prior to her appointment.  He will need amiodarone surveillance lab work at that appointment.   Signed, Jeffrey Dunn, MD, [facility], [facility] 09/24/2022 9:51 AM    Electrophysiology [facility]

## 2022-09-24 ENCOUNTER — Ambulatory Visit: Payer: PPO | Attending: Cardiology | Admitting: Cardiology

## 2022-09-24 ENCOUNTER — Encounter: Payer: Self-pay | Admitting: Cardiology

## 2022-09-24 VITALS — BP 118/70 | HR 62 | Ht 74.0 in | Wt 229.0 lb

## 2022-09-24 DIAGNOSIS — I25119 Atherosclerotic heart disease of native coronary artery with unspecified angina pectoris: Secondary | ICD-10-CM | POA: Diagnosis not present

## 2022-09-24 DIAGNOSIS — I493 Ventricular premature depolarization: Secondary | ICD-10-CM

## 2022-09-24 DIAGNOSIS — I5022 Chronic systolic (congestive) heart failure: Secondary | ICD-10-CM

## 2022-09-24 NOTE — Patient Instructions (Addendum)
Medication Instructions:  Your physician recommends that you continue on your current medications as directed. Please refer to the Current Medication list given to you today.  *If you need a refill on your cardiac medications before your next appointment, please call your pharmacy*  Tests/Procedures: Your physician has recommended that you wear an event monitor prior to your next appointment. Event monitors are medical devices that record the heart's electrical activity. Doctors most often [place] these monitors to diagnose arrhythmias. Arrhythmias are problems with the speed or rhythm of the heartbeat. The monitor is a small, portable device. You can wear one while you do your normal daily activities. This is usually used to diagnose what is causing palpitations/syncope (passing out).  Follow-Up: At [facility], you and your health needs are our priority.  As part of our continuing mission to provide you with exceptional heart care, we have created designated Provider Care Teams.  These Care Teams include your primary Cardiologist (physician) and Advanced Practice Providers (APPs -  Physician Assistants and Nurse Practitioners) who all work together to provide you with the care you need, when you need it.    3 months with Jeffrey Romberg, NP   Call us if you decide that you would like to proceed with an ICD implant.

## 2022-10-13 DIAGNOSIS — M9903 Segmental and somatic dysfunction of lumbar region: Secondary | ICD-10-CM | POA: Diagnosis not present

## 2022-10-13 DIAGNOSIS — M62830 Muscle spasm of back: Secondary | ICD-10-CM | POA: Diagnosis not present

## 2022-10-13 DIAGNOSIS — M9902 Segmental and somatic dysfunction of thoracic region: Secondary | ICD-10-CM | POA: Diagnosis not present

## 2022-10-13 DIAGNOSIS — M5136 Other intervertebral disc degeneration, lumbar region: Secondary | ICD-10-CM | POA: Diagnosis not present

## 2022-10-23 DIAGNOSIS — I2510 Atherosclerotic heart disease of native coronary artery without angina pectoris: Secondary | ICD-10-CM | POA: Diagnosis not present

## 2022-10-23 DIAGNOSIS — I5022 Chronic systolic (congestive) heart failure: Secondary | ICD-10-CM | POA: Diagnosis not present

## 2022-10-23 DIAGNOSIS — Z79899 Other long term (current) drug therapy: Secondary | ICD-10-CM | POA: Diagnosis not present

## 2022-10-23 DIAGNOSIS — I10 Essential (primary) hypertension: Secondary | ICD-10-CM | POA: Diagnosis not present

## 2022-10-23 DIAGNOSIS — E782 Mixed hyperlipidemia: Secondary | ICD-10-CM | POA: Diagnosis not present

## 2022-10-23 DIAGNOSIS — I493 Ventricular premature depolarization: Secondary | ICD-10-CM | POA: Diagnosis not present

## 2022-10-23 DIAGNOSIS — I447 Left bundle-branch block, unspecified: Secondary | ICD-10-CM | POA: Diagnosis not present

## 2022-11-06 DIAGNOSIS — E782 Mixed hyperlipidemia: Secondary | ICD-10-CM | POA: Diagnosis not present

## 2022-11-06 DIAGNOSIS — E559 Vitamin D deficiency, unspecified: Secondary | ICD-10-CM | POA: Diagnosis not present

## 2022-11-06 DIAGNOSIS — Z125 Encounter for screening for malignant neoplasm of prostate: Secondary | ICD-10-CM | POA: Diagnosis not present

## 2022-11-06 DIAGNOSIS — I2510 Atherosclerotic heart disease of native coronary artery without angina pectoris: Secondary | ICD-10-CM | POA: Diagnosis not present

## 2022-11-06 DIAGNOSIS — Z79891 Long term (current) use of opiate analgesic: Secondary | ICD-10-CM | POA: Diagnosis not present

## 2022-11-06 DIAGNOSIS — R7303 Prediabetes: Secondary | ICD-10-CM | POA: Diagnosis not present

## 2022-11-11 ENCOUNTER — Encounter: Payer: Self-pay | Admitting: Dermatology

## 2022-11-11 ENCOUNTER — Ambulatory Visit: Payer: PPO | Admitting: Dermatology

## 2022-11-11 VITALS — BP 132/76 | HR 70

## 2022-11-11 DIAGNOSIS — L578 Other skin changes due to chronic exposure to nonionizing radiation: Secondary | ICD-10-CM | POA: Diagnosis not present

## 2022-11-11 DIAGNOSIS — M5136 Other intervertebral disc degeneration, lumbar region: Secondary | ICD-10-CM | POA: Diagnosis not present

## 2022-11-11 DIAGNOSIS — B078 Other viral warts: Secondary | ICD-10-CM

## 2022-11-11 DIAGNOSIS — L814 Other melanin hyperpigmentation: Secondary | ICD-10-CM | POA: Diagnosis not present

## 2022-11-11 DIAGNOSIS — W908XXA Exposure to other nonionizing radiation, initial encounter: Secondary | ICD-10-CM | POA: Diagnosis not present

## 2022-11-11 DIAGNOSIS — L821 Other seborrheic keratosis: Secondary | ICD-10-CM

## 2022-11-11 DIAGNOSIS — D2339 Other benign neoplasm of skin of other parts of face: Secondary | ICD-10-CM | POA: Diagnosis not present

## 2022-11-11 DIAGNOSIS — D489 Neoplasm of uncertain behavior, unspecified: Secondary | ICD-10-CM

## 2022-11-11 DIAGNOSIS — D492 Neoplasm of unspecified behavior of bone, soft tissue, and skin: Secondary | ICD-10-CM | POA: Diagnosis not present

## 2022-11-11 DIAGNOSIS — M62830 Muscle spasm of back: Secondary | ICD-10-CM | POA: Diagnosis not present

## 2022-11-11 DIAGNOSIS — M9903 Segmental and somatic dysfunction of lumbar region: Secondary | ICD-10-CM | POA: Diagnosis not present

## 2022-11-11 DIAGNOSIS — M9902 Segmental and somatic dysfunction of thoracic region: Secondary | ICD-10-CM | POA: Diagnosis not present

## 2022-11-11 NOTE — Progress Notes (Signed)
Follow-Up Visit   Subjective  Jeffrey Buchanan is a 67 y.o. male who presents for the following: Wart at right nasal sidewall and left forearm treated with LN2. Also would like to have left upper eyelid rechecked. Has upcoming eye appt to check upper eyelid lesion.  The patient has spots, moles and lesions to be evaluated, some may be new or changing and the patient may have concern these could be cancer.   The following portions of the chart were reviewed this encounter and updated as appropriate: medications, allergies, medical history  Review of Systems:  No other skin or systemic complaints except as noted in HPI or Assessment and Plan.  Objective  Well appearing patient in no apparent distress; mood and affect are within normal limits.  Areas Examined: Nose, face, left upper eyelid   Relevant physical exam findings are noted in the Assessment and Plan.  Right Temple 3 mm pink pearly papule         Assessment & Plan   LENTIGINES At left cheek bx proven  Exam: scattered tan macules Due to sun exposure Treatment Plan: Benign-appearing, observe. Recommend daily broad spectrum sunscreen SPF 30+ to sun-exposed areas, reapply every 2 hours as needed.  Call for any changes  SEBORRHEIC KERATOSIS - Stuck-on, waxy, tan-brown papules and/or plaques  - Benign-appearing - Discussed benign etiology and prognosis. - Observe - Call for any changes  Eyelid neoplasm- Possible Cyst vs BCC vrs sebaceous CA  Exam: 2.5 mm flesh smooth papule at left medial mucosal upper eyelid, appears stable in size when compared to previous photo.  Treatment Plan: Probable benign lesion, but recommend second opinion to r/out malignancy Recommend to keep follow up with Ophthalmology to evaluate further   WART Exam: clear at exam at right nasal sidewall and left forearm  Discussed viral / HPV (Human Papilloma Virus) etiology and risk of spread /infectivity to other areas of body as well as  to other people.  Multiple treatments and methods may be required to clear warts and it is possible treatment may not be successful.  Treatment risks include discoloration; scarring and there is still potential for wart recurrence.  Treatment Plan: Clear today.  No treatment recommended.  Observe for recurrence.  ACTINIC DAMAGE - chronic, secondary to cumulative UV radiation exposure/sun exposure over time - diffuse scaly erythematous macules with underlying dyspigmentation - Recommend daily broad spectrum sunscreen SPF 30+ to sun-exposed areas, reapply every 2 hours as needed.  - Recommend staying in the shade or wearing long sleeves, sun glasses (UVA+UVB protection) and wide brim hats (4-inch brim around the entire circumference of the hat). - Call for new or changing lesions.  Neoplasm of uncertain behavior Right Temple  Epidermal / dermal shaving  Lesion diameter (cm):  0.6 Informed consent: discussed and consent obtained   Patient was prepped and draped in usual sterile fashion: Area prepped with alcohol. Anesthesia: the lesion was anesthetized in a standard fashion   Anesthetic:  1% lidocaine w/ epinephrine 1-100,000 buffered w/ 8.4% NaHCO3 Instrument used: flexible razor blade   Hemostasis achieved with: pressure, aluminum chloride and electrodesiccation   Outcome: patient tolerated procedure well    Destruction of lesion  Destruction method: electrodesiccation and curettage   Informed consent: discussed and consent obtained   Curettage performed in three different directions: Yes   Electrodesiccation performed over the curetted area: Yes   Final wound size (cm):  0.6 Hemostasis achieved with:  pressure, aluminum chloride and electrodesiccation Outcome: patient tolerated procedure well  with no complications   Post-procedure details: wound care instructions given   Additional details:  Mupirocin ointment and Bandaid applied   Specimen 1 - Surgical pathology Differential  Diagnosis: Nevus vs sebaceous hyperplasia r/o BCC  Check Margins: No  Nevus vs sebaceous hyperplasia r/o BCC  Actinic skin damage  Other viral warts  Lentigo  Seborrheic keratosis  Neoplasm of skin     Return pending bx.  I, Asher Muir, CMA, am acting as scribe for Willeen Niece, MD.'  Documentation: I have reviewed the above documentation for accuracy and completeness, and I agree with the above.  Willeen Niece, MD

## 2022-11-11 NOTE — Patient Instructions (Addendum)
Biopsy Wound Care Instructions  Leave the original bandage on for 24 hours if possible.  If the bandage becomes soaked or soiled before that time, it is OK to remove it and examine the wound.  A small amount of post-operative bleeding is normal.  If excessive bleeding occurs, remove the bandage, place gauze over the site and apply continuous pressure (no peeking) over the area for 30 minutes. If this does not work, please call our clinic as soon as possible or page your doctor if it is after hours.   Once a day, cleanse the wound with soap and water. It is fine to shower. If a thick crust develops you may use a Q-tip dipped into dilute hydrogen peroxide (mix 1:1 with water) to dissolve it.  Hydrogen peroxide can slow the healing process, so use it only as needed.    After washing, apply petroleum jelly (Vaseline) or an antibiotic ointment if your doctor prescribed one for you, followed by a bandage.    For best healing, the wound should be covered with a layer of ointment at all times. If you are not able to keep the area covered with a bandage to hold the ointment in place, this may mean re-applying the ointment several times a day.  Continue this wound care until the wound has healed and is no longer open.   Itching and mild discomfort is normal during the healing process. However, if you develop pain or severe itching, please call our office.   If you have any discomfort, you can take Tylenol (acetaminophen) or ibuprofen as directed on the bottle. (Please do not take these if you have an allergy to them or cannot take them for another reason).  Some redness, tenderness and white or yellow material in the wound is normal healing.  If the area becomes very sore and red, or develops a thick yellow-green material (pus), it may be infected; please notify us.    If you have stitches, return to clinic as directed to have the stitches removed. You will continue wound care for 2-3 days after the stitches  are removed.   Wound healing continues for up to one year following surgery. It is not unusual to experience pain in the scar from time to time during the interval.  If the pain becomes severe or the scar thickens, you should notify the office.    A slight amount of redness in a scar is expected for the first six months.  After six months, the redness will fade and the scar will soften and fade.  The color difference becomes less noticeable with time.  If there are any problems, return for a post-op surgery check at your earliest convenience.  To improve the appearance of the scar, you can use silicone scar gel, cream, or sheets (such as Mederma or Serica) every night for up to one year. These are available over the counter (without a prescription).  Please call our office at [phone] for any questions or concerns.     Due to recent changes in healthcare laws, you may see results of your pathology and/or laboratory studies on MyChart before the doctors have had a chance to review them. We understand that in some cases there may be results that are confusing or concerning to you. Please understand that not all results are received at the same time and often the doctors may need to interpret multiple results in order to provide you with the best plan of care or course  of treatment. Therefore, we ask that you please give [place] 2 business days to thoroughly review all your results before contacting the office for clarification. Should we see a critical lab result, you will be contacted sooner.   If You Need Anything After Your Visit  If you have any questions or concerns for your doctor, please call our main line at [phone] and press option 4 to reach your doctor's medical assistant. If no one answers, please leave a voicemail as directed and we will return your call as soon as possible. Messages left after 4 pm will be answered the following business day.   You may also send [place] a message via  MyChart. We typically respond to MyChart messages within 1-2 business days.  For prescription refills, please ask your pharmacy to contact our office. Our fax number is [phone].  If you have an urgent issue when the clinic is closed that cannot wait until the next business day, you can page your doctor at the number below.    Please note that while we do our best to be available for urgent issues outside of office hours, we are not available 24/7.   If you have an urgent issue and are unable to reach [place], you may choose to seek medical care at your doctor's office, retail clinic, urgent care center, or emergency room.  If you have a medical emergency, please immediately call 911 or go to the emergency department.  Pager Numbers  - Dr. Gwen Pounds: [phone]  - Dr. Roseanne Reno: [phone]  In the event of inclement weather, please call our main line at [phone] for an update on the status of any delays or closures.  Dermatology Medication Tips: Please keep the boxes that topical medications come in in order to help keep track of the instructions about where and how to use these. Pharmacies typically print the medication instructions only on the boxes and not directly on the medication tubes.   If your medication is too expensive, please contact our office at [phone] option 4 or send [place] a message through MyChart.   We are unable to tell what your co-pay for medications will be in advance as this is different depending on your insurance coverage. However, we may be able to find a substitute medication at lower cost or fill out paperwork to get insurance to cover a needed medication.   If a prior authorization is required to get your medication covered by your insurance company, please allow [place] 1-2 business days to complete this process.  Drug prices often vary depending on where the prescription is filled and some pharmacies may offer cheaper prices.  The website www.goodrx.com  contains coupons for medications through different pharmacies. The prices here do not account for what the cost may be with help from insurance (it may be cheaper with your insurance), but the website can give you the price if you did not use any insurance.  - You can print the associated coupon and take it with your prescription to the pharmacy.  - You may also stop by our office during regular business hours and pick up a GoodRx coupon card.  - If you need your prescription sent electronically to a different pharmacy, notify our office through [facility] or by phone at [phone] option 4.     Si Usted Necesita Algo Despus de Su Visita  Tambin puede enviarnos un mensaje a travs de Clinical cytogeneticist. Por lo general respondemos a los Public relations account executive  transcurso de 1 a 2 das hbiles.  Para renovar recetas, por favor pida a su farmacia que se ponga en contacto con nuestra oficina. Annie Sable de fax es [place] [phone].  Si tiene un asunto urgente cuando la clnica est cerrada y que no puede esperar hasta el siguiente da hbil, puede llamar/localizar a su doctor(a) al nmero que aparece a continuacin.   Por favor, tenga en cuenta que aunque hacemos todo lo posible para estar disponibles para asuntos urgentes fuera del horario de [place], no estamos disponibles las 24 horas del da, los 7 [address]  [address] de la [place].   Si tiene un problema urgente y no puede comunicarse con nosotros, puede optar por buscar atencin mdica  en el consultorio de su doctor(a), en una clnica privada, en un centro de atencin urgente o en una sala de emergencias.  Si tiene Engineer, drilling, por favor llame inmediatamente al 911 o vaya a la sala de emergencias.  Nmeros de bper  - Dr. Gwen Pounds: [phone]  - Dra. Roseanne Reno: [phone]  En caso de inclemencias del [place], por favor llame a Lacy Duverney principal al [phone] para una actualizacin sobre el [place] de cualquier retraso o  cierre.  Consejos para la medicacin en dermatologa: Por favor, guarde las cajas en las que vienen los medicamentos de uso tpico para ayudarle a seguir las instrucciones sobre dnde y cmo usarlos. Las farmacias generalmente imprimen las instrucciones del medicamento slo en las cajas y no directamente en los tubos del [place].   Si su medicamento es muy caro, por favor, pngase en contacto con Rolm Gala llamando al [phone] y presione la opcin 4 o envenos un mensaje a travs de Clinical cytogeneticist.   No podemos decirle cul ser su copago por los medicamentos por adelantado ya que esto es diferente dependiendo de la cobertura de su seguro. Sin embargo, es posible que podamos encontrar un medicamento sustituto a Audiological scientist un formulario para que el seguro cubra el medicamento que se considera necesario.   Si se requiere una autorizacin previa para que su compaa de seguros [place] su medicamento, por favor permtanos de 1 a 2 das hbiles para completar [address]  Los precios de los medicamentos varan con frecuencia dependiendo del Environmental consultant de dnde se surte la receta y alguna farmacias pueden ofrecer precios ms baratos.  El sitio web www.goodrx.com tiene cupones para medicamentos de Health and safety inspector. Los precios aqu no tienen en cuenta lo que podra costar con la ayuda del seguro (puede ser ms barato con su seguro), pero el sitio web puede darle el precio si no utiliz Tourist information centre manager.  - Puede imprimir el cupn correspondiente y llevarlo con su receta a la farmacia.  - Tambin puede pasar por nuestra oficina durante el horario de atencin regular y Education officer, museum una tarjeta de cupones de GoodRx.  - Si necesita que su receta se enve electrnicamente a una farmacia diferente, informe a nuestra oficina a travs de MyChart de [place] o por telfono llamando al [phone] y presione la opcin 4.

## 2022-11-12 ENCOUNTER — Ambulatory Visit: Payer: PPO | Admitting: Dermatology

## 2022-11-13 DIAGNOSIS — I5022 Chronic systolic (congestive) heart failure: Secondary | ICD-10-CM | POA: Diagnosis not present

## 2022-11-13 DIAGNOSIS — K2100 Gastro-esophageal reflux disease with esophagitis, without bleeding: Secondary | ICD-10-CM | POA: Diagnosis not present

## 2022-11-13 DIAGNOSIS — E559 Vitamin D deficiency, unspecified: Secondary | ICD-10-CM | POA: Diagnosis not present

## 2022-11-13 DIAGNOSIS — M545 Low back pain, unspecified: Secondary | ICD-10-CM | POA: Diagnosis not present

## 2022-11-13 DIAGNOSIS — I2510 Atherosclerotic heart disease of native coronary artery without angina pectoris: Secondary | ICD-10-CM | POA: Diagnosis not present

## 2022-11-13 DIAGNOSIS — R7303 Prediabetes: Secondary | ICD-10-CM | POA: Diagnosis not present

## 2022-11-13 DIAGNOSIS — Z0000 Encounter for general adult medical examination without abnormal findings: Secondary | ICD-10-CM | POA: Diagnosis not present

## 2022-11-13 DIAGNOSIS — Z79891 Long term (current) use of opiate analgesic: Secondary | ICD-10-CM | POA: Diagnosis not present

## 2022-11-13 DIAGNOSIS — I10 Essential (primary) hypertension: Secondary | ICD-10-CM | POA: Diagnosis not present

## 2022-11-13 DIAGNOSIS — F17210 Nicotine dependence, cigarettes, uncomplicated: Secondary | ICD-10-CM | POA: Diagnosis not present

## 2022-11-13 DIAGNOSIS — Z1211 Encounter for screening for malignant neoplasm of colon: Secondary | ICD-10-CM | POA: Diagnosis not present

## 2022-11-13 DIAGNOSIS — E782 Mixed hyperlipidemia: Secondary | ICD-10-CM | POA: Diagnosis not present

## 2022-11-14 ENCOUNTER — Other Ambulatory Visit: Payer: Self-pay | Admitting: Internal Medicine

## 2022-11-14 DIAGNOSIS — I10 Essential (primary) hypertension: Secondary | ICD-10-CM

## 2022-11-14 DIAGNOSIS — F17210 Nicotine dependence, cigarettes, uncomplicated: Secondary | ICD-10-CM

## 2022-11-17 ENCOUNTER — Other Ambulatory Visit: Payer: Self-pay

## 2022-11-17 DIAGNOSIS — I493 Ventricular premature depolarization: Secondary | ICD-10-CM

## 2022-11-18 ENCOUNTER — Other Ambulatory Visit: Payer: Self-pay

## 2022-11-18 ENCOUNTER — Telehealth: Payer: Self-pay

## 2022-11-18 DIAGNOSIS — D239 Other benign neoplasm of skin, unspecified: Secondary | ICD-10-CM

## 2022-11-18 DIAGNOSIS — N3943 Post-void dribbling: Secondary | ICD-10-CM

## 2022-11-18 MED ORDER — TAMSULOSIN HCL 0.4 MG PO CAPS
0.8000 mg | ORAL_CAPSULE | Freq: Every day | ORAL | 3 refills | Status: DC
Start: 2022-11-18 — End: 2023-08-24

## 2022-11-18 NOTE — Telephone Encounter (Signed)
-----   Message from Willeen Niece sent at 11/17/2022  7:09 PM EDT ----- Skin , right temple SEBACEOUS ADENOMA, CLOSE TO MARGIN, SEE DESCRIPTION  This is a rare type of benign oil gland growth, but can be a marker of a genetic syndrome called Muir-Torre (or Lynch syndrome) which predisposes to certain types of cancer including urinary tract and colon cancers.  It looks like he has a h/o bladder cancer, and has had some abnormal findings on colonoscopy. Pt needs to be scheduled with genetic counseling at [facility] to do further testing.  - please call patient

## 2022-11-18 NOTE — Telephone Encounter (Signed)
Advised patient of biopsy results. Will schedule referral with genetic counselor at [facility], Ernestina Patches, for further testing. Appt scheduled with Dr Roseanne Reno 05/25/22 at 11:45 AM.

## 2022-11-25 ENCOUNTER — Ambulatory Visit
Admission: RE | Admit: 2022-11-25 | Discharge: 2022-11-25 | Disposition: A | Discharge status: Home / Self Care | Payer: PPO | Source: Ambulatory Visit | Attending: Internal Medicine | Admitting: Internal Medicine

## 2022-11-25 DIAGNOSIS — F17210 Nicotine dependence, cigarettes, uncomplicated: Secondary | ICD-10-CM | POA: Diagnosis not present

## 2022-11-25 DIAGNOSIS — I10 Essential (primary) hypertension: Secondary | ICD-10-CM | POA: Diagnosis not present

## 2022-11-27 DIAGNOSIS — D23121 Other benign neoplasm of skin of left upper eyelid, including canthus: Secondary | ICD-10-CM | POA: Diagnosis not present

## 2022-12-05 ENCOUNTER — Other Ambulatory Visit: Payer: Self-pay | Admitting: Internal Medicine

## 2022-12-05 DIAGNOSIS — E041 Nontoxic single thyroid nodule: Secondary | ICD-10-CM

## 2022-12-11 ENCOUNTER — Inpatient Hospital Stay: Payer: PPO

## 2022-12-11 ENCOUNTER — Encounter: Payer: Self-pay | Admitting: Licensed Clinical Social Worker

## 2022-12-11 ENCOUNTER — Inpatient Hospital Stay: Payer: PPO | Attending: Oncology | Admitting: Licensed Clinical Social Worker

## 2022-12-11 DIAGNOSIS — Z8551 Personal history of malignant neoplasm of bladder: Secondary | ICD-10-CM | POA: Diagnosis not present

## 2022-12-11 DIAGNOSIS — Z86018 Personal history of other benign neoplasm: Secondary | ICD-10-CM

## 2022-12-11 DIAGNOSIS — Z8042 Family history of malignant neoplasm of prostate: Secondary | ICD-10-CM

## 2022-12-11 NOTE — Progress Notes (Signed)
REFERRING PROVIDER: Willeen Niece, MD [address] [place],  [place] [address]  PRIMARY PROVIDER:  Lynnea Ferrier, MD  PRIMARY REASON FOR VISIT:  1. History of sebaceous adenoma   2. History of bladder cancer   3. Family history of prostate cancer      HISTORY OF PRESENT ILLNESS:   Mr. Godbolt, a 67 y.o. male, was seen for a [place] cancer genetics consultation at the request of Dr. Roseanne Reno due to a personal history of sebaceous adenoma.  Mr. Claxton presents to clinic today to discuss the possibility of a hereditary predisposition to cancer, genetic testing, and to further clarify his future cancer risks, as well as potential cancer risks for family members.   CANCER HISTORY:  In 2014, Mr. Melillo was diagnosed with urothelial carcinoma of the bladder. This was treated with surgery. He also reports history of colon cancer found in a colon polyp for which he had colectomy. In 2024, Mr. Erdos was found to have a sebaceous adenoma.  Past Medical History:  Diagnosis Date   Acute MI, inferior wall (HCC) 04/06/2003   a.) PCI 04/10/2003 --> 90% pRCA --> 3.5 x 18 mm and 4.0 x 23 mm Vision stents to RCA.   Anxiety    Arthritis    BPH (benign prostatic hyperplasia)    Chronic anticoagulation    a.) DAPT therapy (ASA + clopidogrel)   Chronic back pain    Coronary artery disease 04/10/2003   a.) LHC 04/10/2003 --> EF 40%; 90% pRCA, 40% x 2 mRCA, 30% and 20% pLCx; 3.5 x 18 mm and 4.0 x 23 mm Vision stents to RCA.   DJD (degenerative joint disease)    GERD (gastroesophageal reflux disease)    Headache    History of adenomatous polyp of colon    S/P  PARTIAL COLECTOMY 2006   History of kidney stones    HLD (hyperlipidemia)    Hyperlipidemia    Hypertension    Skin cancer    Urothelial carcinoma of bladder (HCC) 09/14/2012   a.) Bx 09/14/2012 --> pathology (+) for low grade papillary urothelial carcinoma.   Vitamin D deficiency     Past Surgical History:  Procedure  Laterality Date   COLONOSCOPY WITH PROPOFOL N/A 03/04/2017   Procedure: COLONOSCOPY WITH PROPOFOL;  Surgeon: Toledo, Boykin Nearing, MD;  Location: [facility];  Service: Gastroenterology;  Laterality: N/A;   CORONARY ANGIOPLASTY WITH STENT PLACEMENT Left 04/10/2003   Procedure: CORONARY ANGIOPLASTY WITH STENT PLACEMENT (3.5 x 18 mm and 4.0 x 23 mm Vision stents to RCA); Location: [facility]; Surgeon: Arnoldo Hooker, MD   CYSTOSCOPY W/ RETROGRADES Bilateral 09/26/2013   Procedure: CYSTOSCOPY WITH BILATERAL RETROGRADE PYELOGRAM;  Surgeon: Magdalene Molly, MD;  Location: [facility];  Service: Urology;  Laterality: Bilateral;   CYSTOSCOPY WITH LITHOLAPAXY N/A 06/13/2019   Procedure: CYSTOSCOPY;  Surgeon: Vanna Scotland, MD;  Location: [facility];  Service: Urology;  Laterality: N/A;   CYSTOSCOPY WITH LITHOLAPAXY N/A 01/28/2021   Procedure: CYSTOSCOPY WITH LITHOLAPAXY;  Surgeon: Vanna Scotland, MD;  Location: [facility];  Service: Urology;  Laterality: N/A;   ESOPHAGOGASTRODUODENOSCOPY (EGD) WITH PROPOFOL N/A 03/04/2017   Procedure: ESOPHAGOGASTRODUODENOSCOPY (EGD) WITH PROPOFOL;  Surgeon: Toledo, Boykin Nearing, MD;  Location: [facility];  Service: Gastroenterology;  Laterality: N/A;   OPEN REDUCTION SHOULDER DISLOCATION  04/07/1974   PARTIAL COLECTOMY  04/07/2004   STONE EXTRACTION WITH BASKET N/A 06/13/2019   Procedure: STONE EXTRACTION WITH BASKET;  Surgeon: Vanna Scotland, MD;  Location: [facility];  Service: Urology;  Laterality: N/A;   TRANSURETHRAL RESECTION OF BLADDER TUMOR N/A 2014   TRANSURETHRAL RESECTION OF BLADDER TUMOR WITH GYRUS (TURBT-GYRUS) N/A 09/26/2013   Procedure: BLADDER BIOPSY;  Surgeon: Magdalene Molly, MD;  [facility]: [facility];  Service: Urology;  Laterality: N/A;    FAMILY HISTORY:  We obtained a detailed, 4-generation family history.  Significant diagnoses are listed below: Family History  Problem Relation Age of Onset   Bladder Cancer Mother         dx 4s   Prostate cancer Brother        dx 55s   Cancer Maternal Grandmother        unk type   Kidney disease Neg Hx    Mr. Sender has 1 daughter, 82. No cancers. He has 3 brothers, one had prostate cancer in his 1s.  Mr. Clayton's mother had bladder cancer in her 64s and died at 35 of metastatic disease. Maternal grandmother also had cancer, unknown type. Maternal grandfather is of Jewish descent, little information about him.   Mr. Maready's father died at 80. No known cancers on this side of the family.  Mr. Irias is unaware of previous family history of genetic testing for hereditary cancer risks. There is no reported Ashkenazi Jewish ancestry. There is no known consanguinity.    GENETIC COUNSELING ASSESSMENT: Mr. Dilone is a 67 y.o. male with a personal history of a sebaceous adenoma and bladder cancer which is somewhat suggestive of a hereditary cancer syndrome and predisposition to cancer. We, therefore, discussed and recommended the following at today's visit.   DISCUSSION: We discussed that sebaceous carcinomas are rare cancers that begin in an oil or sweat gland. Sebaceous adenoma is a benign tumor. These can be associated with Lynch syndrome.  According to NCI SEER data, about 2% (64 out of 3299) of patients with sebaceous skin lesions between 2000-2012 had Muir-Torre syndrome (Lynch syndrome) (PMID [id number]). We discussed that approximately 10% of cancer is hereditary. We discussed Lynch syndrome although there are other genes associated with hereditary cancer as well. Cancers and risks are gene specific. We discussed that testing is beneficial for several reasons including knowing about cancer risks, identifying potential screening and risk-reduction options that may be appropriate, and to understand if other family members could be at risk for cancer and allow them to undergo genetic testing.   We reviewed the characteristics, features and inheritance patterns of  hereditary cancer syndromes. We also discussed genetic testing, including the appropriate family members to test, the process of testing, insurance coverage and turn-around-time for results. We discussed the implications of a negative, positive and/or variant of uncertain significant result. We recommended Mr. Dobin pursue genetic testing for the [employer] Common Hereditary Cancers+RNA gene panel.   Based on Mr. Cheadle's personal history of cancer, he meets medical criteria for genetic testing. Despite that he meets criteria, he may still have an out of pocket cost.   PLAN: After considering the risks, benefits, and limitations, Mr. Schellin provided informed consent to pursue genetic testing and the blood sample was sent to [facility] for analysis of the Common Hereditary Cancers+RNA panel. Results should be available within approximately 2-3 weeks' time, at which point they will be disclosed by telephone to Mr. Noreene Filbert, as will any additional recommendations warranted by these results. Mr. Roblyer will receive a summary of his genetic counseling visit and a copy of his results once available. This information will also be available in Epic.   Mr. Hajjar's  questions were answered to his satisfaction today. Our contact information was provided should additional questions or concerns arise. Thank you for the referral and allowing [place] to share in the care of your patient.   Lacy Duverney, MS, [facility] Genetic Counselor [email or web address] Phone: [phone]  The patient was seen for a total of 20 minutes in face-to-face genetic counseling.  Dr. Blake Divine was available for discussion regarding this case.   _______________________________________________________________________ For Office Staff:  Number of people involved in session: 1 Was an Intern/ student involved with case: no

## 2022-12-14 ENCOUNTER — Other Ambulatory Visit: Payer: Self-pay

## 2022-12-14 ENCOUNTER — Emergency Department: Payer: PPO

## 2022-12-14 ENCOUNTER — Emergency Department
Admission: EM | Admit: 2022-12-14 | Discharge: 2022-12-14 | Disposition: A | Discharge status: Home / Self Care | Payer: PPO | Attending: Emergency Medicine | Admitting: Emergency Medicine

## 2022-12-14 DIAGNOSIS — R1032 Left lower quadrant pain: Secondary | ICD-10-CM | POA: Diagnosis present

## 2022-12-14 DIAGNOSIS — N132 Hydronephrosis with renal and ureteral calculous obstruction: Secondary | ICD-10-CM | POA: Insufficient documentation

## 2022-12-14 DIAGNOSIS — N400 Enlarged prostate without lower urinary tract symptoms: Secondary | ICD-10-CM | POA: Diagnosis not present

## 2022-12-14 DIAGNOSIS — N281 Cyst of kidney, acquired: Secondary | ICD-10-CM | POA: Diagnosis not present

## 2022-12-14 DIAGNOSIS — N200 Calculus of kidney: Secondary | ICD-10-CM

## 2022-12-14 DIAGNOSIS — I10 Essential (primary) hypertension: Secondary | ICD-10-CM | POA: Insufficient documentation

## 2022-12-14 DIAGNOSIS — I7143 Infrarenal abdominal aortic aneurysm, without rupture: Secondary | ICD-10-CM | POA: Diagnosis not present

## 2022-12-14 LAB — COMPREHENSIVE METABOLIC PANEL
ALT: 21 U/L (ref 0–44)
AST: 25 U/L (ref 15–41)
Albumin: 4.4 g/dL (ref 3.5–5.0)
Alkaline Phosphatase: 64 U/L (ref 38–126)
Anion gap: 12 (ref 5–15)
BUN: 20 mg/dL (ref 8–23)
CO2: 24 mmol/L (ref 22–32)
Calcium: 9.6 mg/dL (ref 8.9–10.3)
Chloride: 102 mmol/L (ref 98–111)
Creatinine, Ser: 1.3 mg/dL — ABNORMAL HIGH (ref 0.61–1.24)
GFR, Estimated: >60 mL/min (ref >60)
Glucose, Bld: 120 mg/dL — ABNORMAL HIGH (ref 70–99)
Potassium: 3.7 mmol/L (ref 3.5–5.1)
Sodium: 138 mmol/L (ref 135–145)
Total Bilirubin: 0.7 mg/dL (ref 0.3–1.2)
Total Protein: 8 g/dL (ref 6.5–8.1)

## 2022-12-14 LAB — URINALYSIS, ROUTINE W REFLEX MICROSCOPIC
Bilirubin Urine: NEGATIVE
Glucose, UA: NEGATIVE mg/dL
Ketones, ur: 5 mg/dL — AB
Leukocytes,Ua: NEGATIVE
Nitrite: NEGATIVE
Protein, ur: 100 mg/dL — AB
RBC / HPF: >50 RBC/hpf (ref 0–5)
Specific Gravity, Urine: 1.018 (ref 1.005–1.030)
Squamous Epithelial / HPF: 0 /HPF (ref 0–5)
pH: 5 (ref 5.0–8.0)

## 2022-12-14 LAB — CBC WITH DIFFERENTIAL/PLATELET
Abs Immature Granulocytes: 0.05 10*3/uL (ref 0.00–0.07)
Basophils Absolute: 0 10*3/uL (ref 0.0–0.1)
Basophils Relative: 0 %
Eosinophils Absolute: 0 10*3/uL (ref 0.0–0.5)
Eosinophils Relative: 0 %
HCT: 50.2 % (ref 39.0–52.0)
Hemoglobin: 16.8 g/dL (ref 13.0–17.0)
Immature Granulocytes: 1 %
Lymphocytes Relative: 11 %
Lymphs Abs: 1.1 10*3/uL (ref 0.7–4.0)
MCH: 30.3 pg (ref 26.0–34.0)
MCHC: 33.5 g/dL (ref 30.0–36.0)
MCV: 90.5 fL (ref 80.0–100.0)
Monocytes Absolute: 0.4 10*3/uL (ref 0.1–1.0)
Monocytes Relative: 4 %
Neutro Abs: 8.4 10*3/uL — ABNORMAL HIGH (ref 1.7–7.7)
Neutrophils Relative %: 84 %
Platelets: 168 10*3/uL (ref 150–400)
RBC: 5.55 MIL/uL (ref 4.22–5.81)
RDW: 13 % (ref 11.5–15.5)
WBC: 10.1 10*3/uL (ref 4.0–10.5)
nRBC: 0 % (ref 0.0–0.2)

## 2022-12-14 LAB — LIPASE, BLOOD: Lipase: 31 U/L (ref 11–51)

## 2022-12-14 MED ORDER — ONDANSETRON HCL 4 MG/2ML IJ SOLN
4.0000 mg | Freq: Once | INTRAMUSCULAR | Status: AC
  Administered 2022-12-14: 4 mg via INTRAVENOUS
  Filled 2022-12-14: qty 2

## 2022-12-14 MED ORDER — OXYCODONE-ACETAMINOPHEN 5-325 MG PO TABS
1.0000 | ORAL_TABLET | Freq: Three times a day (TID) | ORAL | 0 refills | Status: DC | PRN
Start: 2022-12-14 — End: 2022-12-20

## 2022-12-14 MED ORDER — SODIUM CHLORIDE 0.9 % IV BOLUS
1000.0000 mL | Freq: Once | INTRAVENOUS | Status: AC
  Administered 2022-12-14: 1000 mL via INTRAVENOUS

## 2022-12-14 MED ORDER — IOHEXOL 300 MG/ML  SOLN
100.0000 mL | Freq: Once | INTRAMUSCULAR | Status: AC | PRN
  Administered 2022-12-14: 100 mL via INTRAVENOUS

## 2022-12-14 MED ORDER — KETOROLAC TROMETHAMINE 30 MG/ML IJ SOLN
30.0000 mg | Freq: Once | INTRAMUSCULAR | Status: AC
  Administered 2022-12-14: 30 mg via INTRAVENOUS
  Filled 2022-12-14: qty 1

## 2022-12-14 MED ORDER — MORPHINE SULFATE (PF) 4 MG/ML IV SOLN
4.0000 mg | Freq: Once | INTRAVENOUS | Status: AC
  Administered 2022-12-14: 4 mg via INTRAVENOUS
  Filled 2022-12-14: qty 1

## 2022-12-14 MED ORDER — OXYCODONE-ACETAMINOPHEN 5-325 MG PO TABS
1.0000 | ORAL_TABLET | Freq: Once | ORAL | Status: AC
  Administered 2022-12-14: 1 via ORAL
  Filled 2022-12-14: qty 1

## 2022-12-14 MED ORDER — ONDANSETRON 4 MG PO TBDP: 4.0000 mg | ORAL_TABLET | Freq: Three times a day (TID) | ORAL | 0 refills | Status: DC | PRN

## 2022-12-14 MED ORDER — OXYCODONE-ACETAMINOPHEN 5-325 MG PO TABS
1.0000 | ORAL_TABLET | Freq: Three times a day (TID) | ORAL | 0 refills | Status: DC | PRN
Start: 2022-12-14 — End: 2022-12-14

## 2022-12-14 NOTE — Discharge Instructions (Addendum)
Take the prescription meds as directed. Follow-up with your PCP or urologist as discussed. Drink plenty of fluid and add Miralax for normal stools.

## 2022-12-14 NOTE — ED Triage Notes (Signed)
Patient reports LLQ pain with constipation and nausea, had colon resection x 20 years ago.

## 2022-12-14 NOTE — ED Provider Triage Note (Signed)
Emergency Medicine Provider Triage Evaluation Note  Jeffrey Buchanan , a 67 y.o. male  was evaluated in triage.  Pt complains of LLQ pain that began this morning. Reports he thought it was gas but gas-x didn't relieve his pain. No diarrhea. Not passing gas. Feels nauseous, no vomiting. Had 19 inches of your bowel removed. No history of SBO.   Review of Systems  Positive: Abd pain, nausea Negative: Vomiting  Physical Exam  There were no vitals taken for this visit. Gen:   Awake, no distress   Resp:  Normal effort  MSK:   Moves extremities without difficulty  Other:  Large midline surgical incision  Medical Decision Making  Medically screening exam initiated at 1:54 PM.  Appropriate orders placed.  Jeffrey Buchanan was informed that the remainder of the evaluation will be completed by another provider, this initial triage assessment does not replace that evaluation, and the importance of remaining in the ED until their evaluation is complete.     Jeffrey Hoehn, PA-C 12/14/22 1358

## 2022-12-14 NOTE — ED Provider Notes (Signed)
[facility] Emergency Department Provider Note     Event Date/Time   First MD Initiated Contact with Patient 12/14/22 [phone]     (approximate)   History   Abdominal Pain (Patient reports LLQ pain with constipation and nausea x 5 hours, had colon resection x 20 years ago. )   HPI  Jeffrey Buchanan is a 67 y.o. male with a history of GERD, HLD, HTN, and partial colectomy, presents to the ED with left lower quadrant pain.  Patient would endorse his last bowel movement this morning at 0400.  He has had nausea without vomiting since that time.  He reports feeling as though he has gas pain but denies the ability to pass it.  He also endorses some pink-tinged urine on spontaneous voiding.  No history of kidney stones but he denies ever passing a stone.  Physical Exam   Triage Vital Signs: ED Triage Vitals  Encounter Vitals Group     BP 12/14/22 1401 (!) 151/77     Systolic BP Percentile --      Diastolic BP Percentile --      Pulse Rate 12/14/22 1401 63     Resp 12/14/22 1401 18     Temp 12/14/22 1401 (!) 97.5 F (36.4 C)     Temp Source 12/14/22 1401 Oral     SpO2 12/14/22 1401 93 %     Weight 12/14/22 1402 224 lb (101.6 kg)     Height 12/14/22 1402 6\' 2"  (1.88 m)     Head Circumference --      Peak Flow --      Pain Score 12/14/22 1400 8     Pain Loc --      Pain Education --      Exclude from Growth Chart --     Most recent vital signs: Vitals:   12/14/22 1401 12/14/22 1922  BP: (!) 151/77 (!) 148/69  Pulse: 63 69  Resp: 18 18  Temp: (!) 97.5 F (36.4 C)   SpO2: 93% 95%    General Awake, no distress. NAD HEENT NCAT. PERRL. EOMI. No rhinorrhea. Mucous membranes are moist.  CV:  Good peripheral perfusion. RRR RESP:  Normal effort. CTA ABD:  No distention.  Soft and mildly tender to palpation of the left lower quadrant.  Normoactive bowel sounds x 4.  No rebound, guarding, or rigidity noted.  No CVA tenderness elicited.  ED Results /  Procedures / Treatments   Labs (all labs ordered are listed, but only abnormal results are displayed) Labs Reviewed  COMPREHENSIVE METABOLIC PANEL - Abnormal; Notable for the following components:      Result Value   Glucose, Bld 120 (*)    Creatinine, Ser 1.30 (*)    All other components within normal limits  CBC WITH DIFFERENTIAL/PLATELET - Abnormal; Notable for the following components:   Neutro Abs 8.4 (*)    All other components within normal limits  URINALYSIS, ROUTINE W REFLEX MICROSCOPIC - Abnormal; Notable for the following components:   Color, Urine AMBER (*)    APPearance CLOUDY (*)    Hgb urine dipstick LARGE (*)    Ketones, ur 5 (*)    Protein, ur 100 (*)    Bacteria, UA FEW (*)    All other components within normal limits  LIPASE, BLOOD     EKG   RADIOLOGY  I personally viewed and evaluated these images as part of my medical decision making, as well as reviewing the  written report by the radiologist.  ED Provider Interpretation: Left UVJ stone measuring 5 mm  CT ABDOMEN PELVIS W CONTRAST  Result Date: 12/14/2022 CLINICAL DATA:  Left lower quadrant abdominal pain, constipation EXAM: CT ABDOMEN AND PELVIS WITH CONTRAST TECHNIQUE: Multidetector CT imaging of the abdomen and pelvis was performed using the standard protocol following bolus administration of intravenous contrast. RADIATION DOSE REDUCTION: This exam was performed according to the departmental dose-optimization program which includes automated exposure control, adjustment of the mA and/or kV according to patient size and/or use of iterative reconstruction technique. CONTRAST:  OMNIPAQUE IOHEXOL 300 MG/ML  SOLN COMPARISON:  None Available. FINDINGS: Lower chest: No acute abnormality. Extensive right coronary artery calcification. Hepatobiliary: No focal liver abnormality is seen. No gallstones, gallbladder wall thickening, or biliary dilatation. Pancreas: Unremarkable Spleen: Unremarkable  Adrenals/Urinary Tract: The adrenal glands are unremarkable. The kidneys are normal in size and position. 5.7 cm simple cortical cyst noted within the anterior interpolar region of the right kidney for which no follow-up imaging is recommended. Multiple nonobstructing calculi are seen within the kidneys bilaterally with a staghorn calculus within the lower pole of the right kidney measuring up to 2.9 x 2.9 x 1.5 cm. There is mild left hydronephrosis and moderate perinephric edema and stranding secondary to an obstructing 3 x 5 mm calculus within the left ureteropelvic junction. No additional ureteral calculi. No hydronephrosis on the right. Multiple layering calculi are seen within the bladder lumen measuring up to 5 mm in size. The bladder is not distended. Stomach/Bowel: Surgical changes of left hemicolectomy are identified with a relatively high anastomosis identified within the right mid abdomen. The stomach, small bowel, and large bowel are otherwise unremarkable there is no evidence of obstruction or focal inflammation. Appendix normal. No free intraperitoneal gas or fluid Vascular/Lymphatic: 3.1 cm fusiform infrarenal abdominal aortic aneurysm is present. Superimposed extensive aortoiliac atherosclerotic calcification. No pathologic adenopathy within the abdomen and pelvis. Reproductive: Marked prostatic hypertrophy. Other: No abdominal wall hernia or abnormality. No abdominopelvic ascites. Musculoskeletal: No acute or significant osseous findings. IMPRESSION: 1. Obstructing 3 x 5 mm calculus within the left ureteropelvic junction resulting in mild left hydronephrosis and moderate perinephric edema and stranding. 2. Multiple nonobstructing calculi within the kidneys bilaterally with a staghorn calculus within the lower pole of the right kidney measuring up to 2.9 cm in size. 3. Multiple layering calculi within the bladder lumen measuring up to 5 mm in size. 4. 3.1 cm fusiform infrarenal abdominal aortic  aneurysm. Recommend follow-up ultrasound every 3 years. 5. Extensive right coronary artery calcification. 6. Marked prostatic hypertrophy. Aortic Atherosclerosis (ICD10-I70.0). Electronically Signed   By: Helyn Numbers M.D.   On: 12/14/2022 18:37     PROCEDURES:  Critical Care performed: No  Procedures   MEDICATIONS ORDERED IN ED: Medications  sodium chloride 0.9 % bolus 1,000 mL (0 mLs Intravenous Stopped 12/14/22 1921)  ondansetron (ZOFRAN) injection 4 mg (4 mg Intravenous Given 12/14/22 1722)  morphine (PF) 4 MG/ML injection 4 mg (4 mg Intravenous Given 12/14/22 1723)  iohexol (OMNIPAQUE) 300 MG/ML solution 100 mL (100 mLs Intravenous Contrast Given 12/14/22 1747)  ketorolac (TORADOL) 30 MG/ML injection 30 mg (30 mg Intravenous Given 12/14/22 1814)  oxyCODONE-acetaminophen (PERCOCET/ROXICET) 5-325 MG per tablet 1 tablet (1 tablet Oral Given 12/14/22 1921)     IMPRESSION / MDM / ASSESSMENT AND PLAN / ED COURSE  I reviewed the triage vital signs and the nursing notes.  Differential diagnosis includes, but is not limited to, acute appendicitis, renal colic, testicular torsion, urinary tract infection/pyelonephritis, prostatitis,  epididymitis, diverticulitis, small bowel obstruction or ileus, colitis, abdominal aortic aneurysm, gastroenteritis, hernia, etc.  Patient's presentation is most consistent with acute complicated illness / injury requiring diagnostic workup.  Patient's diagnosis is consistent with renal colic secondary to multiple renal stones.  Patient with CT evidence of multiple stones including a 5 mm left UVJ stone causing some mild hydronephrosis.  Patient stable at this time noting significant room of his pain after IV Toradol administration.  Patient will be discharged home with prescriptions for Percocet and Zofran.  Encouraged to continue with his previously prescribed tamsulosin.  Patient is to follow up with urology as discussed, as needed or  otherwise directed. Patient is given ED precautions to return to the ED for any worsening or new symptoms.     FINAL CLINICAL IMPRESSION(S) / ED DIAGNOSES   Final diagnoses:  Kidney stone     Rx / DC Orders   ED Discharge Orders          Ordered    oxyCODONE-acetaminophen (PERCOCET) 5-325 MG tablet  Every 8 hours PRN,   Status:  Discontinued        12/14/22 1912    ondansetron (ZOFRAN-ODT) 4 MG disintegrating tablet  Every 8 hours PRN        12/14/22 1912    oxyCODONE-acetaminophen (PERCOCET) 5-325 MG tablet  Every 8 hours PRN       Note to Pharmacy: Acute renal stone pain   12/14/22 1920             Note:  This document was prepared using Dragon voice recognition software and may include unintentional dictation errors.    Tammi Boulier, Charlesetta Ivory, PA-C 12/15/22 0000    Merwyn Katos, MD 12/23/22 [phone]

## 2022-12-15 ENCOUNTER — Ambulatory Visit
Admission: RE | Admit: 2022-12-15 | Discharge: 2022-12-15 | Disposition: A | Discharge status: Home / Self Care | Payer: PPO | Source: Ambulatory Visit | Attending: Internal Medicine | Admitting: Internal Medicine

## 2022-12-15 DIAGNOSIS — E041 Nontoxic single thyroid nodule: Secondary | ICD-10-CM | POA: Diagnosis not present

## 2022-12-15 NOTE — Group Note (Deleted)

## 2022-12-17 ENCOUNTER — Encounter: Payer: Self-pay | Admitting: Urology

## 2022-12-18 ENCOUNTER — Encounter: Payer: Self-pay | Admitting: Licensed Clinical Social Worker

## 2022-12-18 ENCOUNTER — Telehealth: Payer: Self-pay | Admitting: Licensed Clinical Social Worker

## 2022-12-18 DIAGNOSIS — Z1509 Genetic susceptibility to other malignant neoplasm: Secondary | ICD-10-CM | POA: Insufficient documentation

## 2022-12-18 DIAGNOSIS — Z1379 Encounter for other screening for genetic and chromosomal anomalies: Secondary | ICD-10-CM | POA: Insufficient documentation

## 2022-12-18 HISTORY — DX: Genetic susceptibility to other malignant neoplasm: Z15.09

## 2022-12-18 NOTE — Telephone Encounter (Signed)
I contacted Mr. Huseby to discuss his genetic testing results. 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 The remainder of testing was normal. He will meet with me 9/17 at 11 am to discuss result in detail.   The test report has been scanned into EPIC and is located under the Molecular Pathology section of the Results Review tab.  A portion of the result report is included below for reference.      Lacy Duverney, MS, [facility] Genetic Counselor [email or web address] Phone: [phone]

## 2022-12-18 NOTE — Telephone Encounter (Signed)
Patient advised, first available with Dr Apolinar Junes is not until 01/14/23. Patient states he is feeling a little better and will update [place] next week if he would like to be seen. I will check with Dr Apolinar Junes at that time if we can work patient in sooner.

## 2022-12-19 ENCOUNTER — Observation Stay
Admission: EM | Admit: 2022-12-19 | Discharge: 2022-12-20 | Disposition: A | Discharge status: Home / Self Care | Payer: PPO | Attending: Internal Medicine | Admitting: Internal Medicine

## 2022-12-19 ENCOUNTER — Other Ambulatory Visit: Payer: Self-pay

## 2022-12-19 ENCOUNTER — Emergency Department: Payer: PPO

## 2022-12-19 DIAGNOSIS — R109 Unspecified abdominal pain: Secondary | ICD-10-CM | POA: Diagnosis present

## 2022-12-19 DIAGNOSIS — N10 Acute tubulo-interstitial nephritis: Secondary | ICD-10-CM | POA: Insufficient documentation

## 2022-12-19 DIAGNOSIS — N200 Calculus of kidney: Secondary | ICD-10-CM | POA: Diagnosis not present

## 2022-12-19 DIAGNOSIS — Z955 Presence of coronary angioplasty implant and graft: Secondary | ICD-10-CM | POA: Diagnosis not present

## 2022-12-19 DIAGNOSIS — N135 Crossing vessel and stricture of ureter without hydronephrosis: Secondary | ICD-10-CM | POA: Insufficient documentation

## 2022-12-19 DIAGNOSIS — Z7901 Long term (current) use of anticoagulants: Secondary | ICD-10-CM | POA: Diagnosis not present

## 2022-12-19 DIAGNOSIS — N179 Acute kidney failure, unspecified: Principal | ICD-10-CM

## 2022-12-19 DIAGNOSIS — Z7982 Long term (current) use of aspirin: Secondary | ICD-10-CM | POA: Insufficient documentation

## 2022-12-19 DIAGNOSIS — Z79899 Other long term (current) drug therapy: Secondary | ICD-10-CM | POA: Diagnosis not present

## 2022-12-19 DIAGNOSIS — Z7902 Long term (current) use of antithrombotics/antiplatelets: Secondary | ICD-10-CM | POA: Diagnosis not present

## 2022-12-19 DIAGNOSIS — I2510 Atherosclerotic heart disease of native coronary artery without angina pectoris: Secondary | ICD-10-CM | POA: Diagnosis not present

## 2022-12-19 DIAGNOSIS — Z122 Encounter for screening for malignant neoplasm of respiratory organs: Secondary | ICD-10-CM | POA: Diagnosis not present

## 2022-12-19 DIAGNOSIS — N132 Hydronephrosis with renal and ureteral calculous obstruction: Secondary | ICD-10-CM | POA: Insufficient documentation

## 2022-12-19 DIAGNOSIS — N400 Enlarged prostate without lower urinary tract symptoms: Secondary | ICD-10-CM | POA: Diagnosis not present

## 2022-12-19 DIAGNOSIS — N210 Calculus in bladder: Principal | ICD-10-CM | POA: Insufficient documentation

## 2022-12-19 DIAGNOSIS — I714 Abdominal aortic aneurysm, without rupture, unspecified: Secondary | ICD-10-CM | POA: Diagnosis not present

## 2022-12-19 DIAGNOSIS — N202 Calculus of kidney with calculus of ureter: Secondary | ICD-10-CM | POA: Diagnosis not present

## 2022-12-19 DIAGNOSIS — I5022 Chronic systolic (congestive) heart failure: Secondary | ICD-10-CM | POA: Insufficient documentation

## 2022-12-19 DIAGNOSIS — I110 Hypertensive heart disease with heart failure: Secondary | ICD-10-CM | POA: Insufficient documentation

## 2022-12-19 DIAGNOSIS — N201 Calculus of ureter: Secondary | ICD-10-CM

## 2022-12-19 DIAGNOSIS — Z85828 Personal history of other malignant neoplasm of skin: Secondary | ICD-10-CM | POA: Insufficient documentation

## 2022-12-19 HISTORY — DX: Acute pyelonephritis: N10

## 2022-12-19 HISTORY — DX: Calculus of ureter: N20.1

## 2022-12-19 HISTORY — DX: Acute kidney failure, unspecified: N17.9

## 2022-12-19 HISTORY — DX: Crossing vessel and stricture of ureter without hydronephrosis: N13.5

## 2022-12-19 LAB — URINALYSIS, ROUTINE W REFLEX MICROSCOPIC
Bacteria, UA: NONE SEEN
Bilirubin Urine: NEGATIVE
Glucose, UA: NEGATIVE mg/dL
Ketones, ur: NEGATIVE mg/dL
Nitrite: NEGATIVE
Protein, ur: NEGATIVE mg/dL
RBC / HPF: >50 RBC/hpf (ref 0–5)
Specific Gravity, Urine: 1.019 (ref 1.005–1.030)
pH: 5 (ref 5.0–8.0)

## 2022-12-19 LAB — CBC
HCT: 46 % (ref 39.0–52.0)
Hemoglobin: 15.5 g/dL (ref 13.0–17.0)
MCH: 30.8 pg (ref 26.0–34.0)
MCHC: 33.7 g/dL (ref 30.0–36.0)
MCV: 91.3 fL (ref 80.0–100.0)
Platelets: 175 10*3/uL (ref 150–400)
RBC: 5.04 MIL/uL (ref 4.22–5.81)
RDW: 13.1 % (ref 11.5–15.5)
WBC: 9.2 10*3/uL (ref 4.0–10.5)
nRBC: 0 % (ref 0.0–0.2)

## 2022-12-19 LAB — BASIC METABOLIC PANEL
Anion gap: 12 (ref 5–15)
BUN: 38 mg/dL — ABNORMAL HIGH (ref 8–23)
CO2: 27 mmol/L (ref 22–32)
Calcium: 9.8 mg/dL (ref 8.9–10.3)
Chloride: 99 mmol/L (ref 98–111)
Creatinine, Ser: 2.27 mg/dL — ABNORMAL HIGH (ref 0.61–1.24)
GFR, Estimated: 31 mL/min — ABNORMAL LOW (ref >60)
Glucose, Bld: 104 mg/dL — ABNORMAL HIGH (ref 70–99)
Potassium: 3.6 mmol/L (ref 3.5–5.1)
Sodium: 138 mmol/L (ref 135–145)

## 2022-12-19 MED ORDER — ALPRAZOLAM 0.5 MG PO TABS
0.5000 mg | ORAL_TABLET | Freq: Two times a day (BID) | ORAL | Status: DC | PRN
  Administered 2022-12-20: 0.5 mg via ORAL
  Filled 2022-12-19: qty 1

## 2022-12-19 MED ORDER — DOCUSATE SODIUM 100 MG PO CAPS: 100.0000 mg | ORAL_CAPSULE | Freq: Two times a day (BID) | ORAL | Status: DC | PRN

## 2022-12-19 MED ORDER — ONDANSETRON HCL 4 MG/2ML IJ SOLN
4.0000 mg | Freq: Four times a day (QID) | INTRAMUSCULAR | Status: DC | PRN
  Administered 2022-12-20: 4 mg via INTRAVENOUS
  Filled 2022-12-19: qty 2

## 2022-12-19 MED ORDER — SODIUM CHLORIDE 0.9 % IV BOLUS
1000.0000 mL | Freq: Once | INTRAVENOUS | Status: AC
  Administered 2022-12-19: 1000 mL via INTRAVENOUS

## 2022-12-19 MED ORDER — ONDANSETRON HCL 4 MG PO TABS: 4.0000 mg | ORAL_TABLET | Freq: Four times a day (QID) | ORAL | Status: DC | PRN

## 2022-12-19 MED ORDER — ROSUVASTATIN CALCIUM 10 MG PO TABS
40.0000 mg | ORAL_TABLET | Freq: Every day | ORAL | Status: DC
  Administered 2022-12-19: 40 mg via ORAL
  Filled 2022-12-19: qty 4
  Filled 2022-12-19: qty 2

## 2022-12-19 MED ORDER — LEVOFLOXACIN IN D5W 750 MG/150ML IV SOLN
750.0000 mg | INTRAVENOUS | Status: DC
  Administered 2022-12-19: 750 mg via INTRAVENOUS
  Filled 2022-12-19: qty 150

## 2022-12-19 MED ORDER — ONDANSETRON HCL 4 MG/2ML IJ SOLN
4.0000 mg | Freq: Once | INTRAMUSCULAR | Status: AC
  Administered 2022-12-19: 4 mg via INTRAVENOUS
  Filled 2022-12-19: qty 2

## 2022-12-19 MED ORDER — AMIODARONE HCL 200 MG PO TABS
200.0000 mg | ORAL_TABLET | Freq: Every day | ORAL | Status: DC
  Administered 2022-12-19: 200 mg via ORAL
  Filled 2022-12-19: qty 1

## 2022-12-19 MED ORDER — HYDROMORPHONE HCL 1 MG/ML IJ SOLN
1.0000 mg | Freq: Once | INTRAMUSCULAR | Status: AC
  Administered 2022-12-19: 1 mg via INTRAVENOUS
  Filled 2022-12-19: qty 1

## 2022-12-19 MED ORDER — HYDRALAZINE HCL 20 MG/ML IJ SOLN: 5.0000 mg | Freq: Four times a day (QID) | INTRAMUSCULAR | Status: DC | PRN

## 2022-12-19 MED ORDER — TAMSULOSIN HCL 0.4 MG PO CAPS
0.8000 mg | ORAL_CAPSULE | Freq: Every day | ORAL | Status: DC
  Administered 2022-12-19: 0.8 mg via ORAL
  Filled 2022-12-19 (×2): qty 2

## 2022-12-19 MED ORDER — METOPROLOL SUCCINATE ER 50 MG PO TB24
50.0000 mg | ORAL_TABLET | Freq: Every evening | ORAL | Status: DC
  Administered 2022-12-19: 50 mg via ORAL
  Filled 2022-12-19: qty 1

## 2022-12-19 MED ORDER — HYDROCODONE-ACETAMINOPHEN 5-325 MG PO TABS
1.0000 | ORAL_TABLET | Freq: Four times a day (QID) | ORAL | Status: DC | PRN
  Administered 2022-12-20: 1 via ORAL
  Filled 2022-12-19: qty 1

## 2022-12-19 MED ORDER — HYDROMORPHONE HCL 1 MG/ML IJ SOLN
0.5000 mg | INTRAMUSCULAR | Status: DC | PRN
  Administered 2022-12-19 (×3): 1 mg via INTRAVENOUS
  Filled 2022-12-19 (×3): qty 1

## 2022-12-19 MED ORDER — FINASTERIDE 5 MG PO TABS
5.0000 mg | ORAL_TABLET | Freq: Every day | ORAL | Status: DC
  Administered 2022-12-20: 5 mg via ORAL
  Filled 2022-12-19 (×3): qty 1

## 2022-12-19 MED ORDER — ASPIRIN 81 MG PO TBEC
81.0000 mg | DELAYED_RELEASE_TABLET | Freq: Every day | ORAL | Status: DC
  Filled 2022-12-19: qty 1

## 2022-12-19 MED ORDER — SODIUM CHLORIDE 0.9 % IV SOLN: INTRAVENOUS | Status: DC

## 2022-12-19 MED ORDER — SENNOSIDES-DOCUSATE SODIUM 8.6-50 MG PO TABS: 1.0000 | ORAL_TABLET | Freq: Every evening | ORAL | Status: DC | PRN

## 2022-12-19 NOTE — ED Notes (Signed)
Patient given ice chips per request and with Neil Crouch PA-C permission.

## 2022-12-19 NOTE — Progress Notes (Signed)
Patient arrived to room 213. Family at the bedside.   Madie Reno, RN

## 2022-12-19 NOTE — ED Notes (Signed)
Patient has wallet, cell phone, keys, glasses and two bags with him on admission.

## 2022-12-19 NOTE — Progress Notes (Signed)
Pharmacy Antibiotic Note  Jeffrey Buchanan is a 67 y.o. male w/ PMH of recent kidney stone, bladder cancer and bladder stones, CAD admitted on 12/19/2022 with UTI.  Pharmacy has been consulted for levofloxaxin dosing.  Plan: start levofloxacin 750 mg IV every 48 hours --follow renal function for needed dose adjustents  Height: 6\' 2"  (188 cm) Weight: 101.6 kg (223 lb 15.8 oz) IBW/kg (Calculated) : 82.2  Temp (24hrs), Avg:98 F (36.7 C), Min:98 F (36.7 C), Max:98 F (36.7 C)  Recent Labs  Lab 12/14/22 1404 12/19/22 1103  WBC 10.1 9.2  CREATININE 1.30* 2.27*    Estimated Creatinine Clearance: 40.7 mL/min (A) (by C-G formula based on SCr of 2.27 mg/dL (H)).    Allergies  Allergen Reactions   Cephalosporins Diarrhea   Escitalopram Oxalate Other (See Comments)    lethargic    Antimicrobials this admission: 09/13 levofloxacin >>   Microbiology results: none currently  Thank you for allowing pharmacy to be a part of this patient's care.  Lowella Bandy 12/19/2022 3:31 PM

## 2022-12-19 NOTE — ED Triage Notes (Signed)
Pt comes with c/o left flank pain. Pt was seen here in Ed on 9/8 and was dx with kidney stone. Pt states he hasn't passed it yet and still having pain. Pt states blood in urine.

## 2022-12-19 NOTE — ED Provider Notes (Signed)
[facility] Provider Note    Event Date/Time   First MD Initiated Contact with Patient 12/19/22 1129     (approximate)   History   Flank Pain   HPI  Jeffrey Buchanan is a 67 y.o. male with history of recent kidney stone, bladder cancer and bladder stones, CAD presents emergency department with continued left flank pain.  Patient states was seen here in the ED on September 8 and was given Toradol which really helped along with pain medication.  Patient states he was told he could not continue the Toradol because of his Plavix so has been taking Percocet without any relief.  States at first he thought he had passed a stone but has now continued to have more pain.  Did contact his urologist, Dr. Apolinar Junes.  Is unable to get an appointment until October 9.  Denies fever chills      Physical Exam   Triage Vital Signs: ED Triage Vitals  Encounter Vitals Group     BP 12/19/22 1102 129/73     Systolic BP Percentile --      Diastolic BP Percentile --      Pulse Rate 12/19/22 1102 82     Resp 12/19/22 1102 18     Temp 12/19/22 1102 98 F (36.7 C)     Temp src --      SpO2 12/19/22 1102 100 %     Weight 12/19/22 1147 223 lb 15.8 oz (101.6 kg)     Height 12/19/22 1147 6\' 2"  (1.88 m)     Head Circumference --      Peak Flow --      Pain Score 12/19/22 1101 8     Pain Loc --      Pain Education --      Exclude from Growth Chart --     Most recent vital signs: Vitals:   12/19/22 1102 12/19/22 1421  BP: 129/73 (!) 143/78  Pulse: 82 71  Resp: 18 16  Temp: 98 F (36.7 C)   SpO2: 100% 92%     General: Awake, no distress.   CV:  Good peripheral perfusion. regular rate and  rhythm Resp:  Normal effort. Lungs cta Abd:  No distention.  No CVA tenderness Other:      ED Results / Procedures / Treatments   Labs (all labs ordered are listed, but only abnormal results are displayed) Labs Reviewed  URINALYSIS, ROUTINE W REFLEX MICROSCOPIC - Abnormal;  Notable for the following components:      Result Value   Color, Urine YELLOW (*)    APPearance HAZY (*)    Hgb urine dipstick LARGE (*)    Leukocytes,Ua TRACE (*)    All other components within normal limits  BASIC METABOLIC PANEL - Abnormal; Notable for the following components:   Glucose, Bld 104 (*)    BUN 38 (*)    Creatinine, Ser 2.27 (*)    GFR, Estimated 31 (*)    All other components within normal limits  CBC     EKG     RADIOLOGY CT renal stone    PROCEDURES:   Procedures   MEDICATIONS ORDERED IN ED: Medications  HYDROmorphone (DILAUDID) injection 1 mg (1 mg Intravenous Given 12/19/22 1352)  ondansetron (ZOFRAN) injection 4 mg (4 mg Intravenous Given 12/19/22 1352)  sodium chloride 0.9 % bolus 1,000 mL (1,000 mLs Intravenous New Bag/Given 12/19/22 1415)     IMPRESSION / MDM / ASSESSMENT AND PLAN /  ED COURSE  I reviewed the triage vital signs and the nursing notes.                              Differential diagnosis includes, but is not limited to, infected kidney stone, obstructing kidney stone, UTI, sepsis, AKI  Patient's presentation is most consistent with acute illness / injury with system symptoms.   Patient's labs are concerning for decreased kidney function.  Patient's GFR on September 8 was greater than 60, today is approximately 63.  Therefore did not give the patient Toradol here in the ED  Dilaudid 1 mg IV, normal saline 1 L IV  CT renal stone study ordered  CT shows.  The 5 mm stone has moved more distally but there is another stone which is nonobstructing this time.  Increased amount of perinephric stranding.  Consult to urology Consult with Dr. Lonna Cobb from urology, states admit for pain control and rehydration.  If kidney functions have not improved tomorrow will do a stent  Consult hospitalist for admission Spoke with dr Chipper Herb, will admit patient Patient is in stable condition at this time     FINAL CLINICAL IMPRESSION(S) / ED  DIAGNOSES   Final diagnoses:  AKI (acute kidney injury) (HCC)  Kidney stone     Rx / DC Orders   ED Discharge Orders     None        Note:  This document was prepared using Dragon voice recognition software and may include unintentional dictation errors.    Faythe Ghee, PA-C 12/19/22 1456    Minna Antis, MD 12/19/22 2046

## 2022-12-19 NOTE — ED Notes (Signed)
Left hand IV was positional and didn't allow the patient to move his hand to hold his book while NS was infusing. New IV was placed. NS is flowing freely to gravity.

## 2022-12-19 NOTE — Consult Note (Signed)
Urology Consult  Requesting provider: Greig Right, PA-C  Reason for consultation: Obstructing left ureteral calculus with AKI  Chief Complaint: Kidney stone  History of Present Illness: Jeffrey Buchanan is a 67 y.o. who initially presented to the ED 12/14/2022 complaining of left lower quadrant abdominal pain associated with nausea.  Urinalysis showed >50 RBC/11-20 WBC; CT abdomen pelvis with contrast was performed which showed a 3 x 5 mm left proximal ureteral calculus with mild hydronephrosis and moderate perinephric edema and stranding.  Also noted to have bilateral nonobstructing renal calculi with a partial right staghorn calculus.  Also noted to have multiple bladder calculi the largest measuring 5 mm. He was treated with IV ketorolac, Zofran and oral oxycodone with improvement in his pain and nausea.  He was discharged on oxycodone and Zofran. After ED discharge he had recurrent pain which he states was intermittent but severe at times which was not improved with oxycodone.  He had significant decreased fluid and p.o. intake secondary to nausea from 9/8 to 9/11.  He contacted our office and was unable to get in until October 9 Nausea and appetite improved yesterday.  He has had some increase in his pain today and stated he came to the ED to be reevaluated since the weekend was approaching.  No bothersome LUTS.  Denies dysuria, gross hematuria or fever/chills. Urinalysis today with >50 RBC/6-10 WBC; CT renal stone study was performed which showed persistent faint parenchymal enhancement of the left kidney and migration of the previously seen proximal ureteral calculus to the left UVJ.  There was a punctate stone just proximal to the stone..  Increased hydronephrosis and hydroureter was noted Creatinine had significantly increased to 2.27 (eGFR 31).  Creatinine initial ED visit was 1.30 with a baseline creatinine in the low 1 range. He is an established patient of Dr. Apolinar Junes and last saw her  February 2024.  He has had a prior cystolitholapaxy October 2022 for multiple bladder stones but declined an outlet procedure for his significant BPH.  On tamsulosin and finasteride.  He elected continued medical management for his BPH and declined cystolitholapaxy.  He also elected surveillance of his renal calculi.  Past Medical History:  Diagnosis Date   Acute MI, inferior wall (HCC) 04/06/2003   a.) PCI 04/10/2003 --> 90% pRCA --> 3.5 x 18 mm and 4.0 x 23 mm Vision stents to RCA.   Anxiety    Arthritis    BPH (benign prostatic hyperplasia)    Chronic anticoagulation    a.) DAPT therapy (ASA + clopidogrel)   Chronic back pain    Coronary artery disease 04/10/2003   a.) LHC 04/10/2003 --> EF 40%; 90% pRCA, 40% x 2 mRCA, 30% and 20% pLCx; 3.5 x 18 mm and 4.0 x 23 mm Vision stents to RCA.   DJD (degenerative joint disease)    GERD (gastroesophageal reflux disease)    Headache    History of adenomatous polyp of colon    S/P  PARTIAL COLECTOMY 2006   History of kidney stones    HLD (hyperlipidemia)    Hyperlipidemia    Hypertension    Skin cancer    Urothelial carcinoma of bladder (HCC) 09/14/2012   a.) Bx 09/14/2012 --> pathology (+) for low grade papillary urothelial carcinoma.   Vitamin D deficiency     Past Surgical History:  Procedure Laterality Date   COLONOSCOPY WITH PROPOFOL N/A 03/04/2017   Procedure: COLONOSCOPY WITH PROPOFOL;  Surgeon: Toledo, Boykin Nearing, MD;  Location: [facility];  Service: Gastroenterology;  Laterality: N/A;   CORONARY ANGIOPLASTY WITH STENT PLACEMENT Left 04/10/2003   Procedure: CORONARY ANGIOPLASTY WITH STENT PLACEMENT (3.5 x 18 mm and 4.0 x 23 mm Vision stents to RCA); Location: [facility]; Surgeon: Arnoldo Hooker, MD   CYSTOSCOPY W/ RETROGRADES Bilateral 09/26/2013   Procedure: CYSTOSCOPY WITH BILATERAL RETROGRADE PYELOGRAM;  Surgeon: Magdalene Molly, MD;  Location: [facility];  Service: Urology;  Laterality: Bilateral;   CYSTOSCOPY  WITH LITHOLAPAXY N/A 06/13/2019   Procedure: CYSTOSCOPY;  Surgeon: Vanna Scotland, MD;  Location: [facility];  Service: Urology;  Laterality: N/A;   CYSTOSCOPY WITH LITHOLAPAXY N/A 01/28/2021   Procedure: CYSTOSCOPY WITH LITHOLAPAXY;  Surgeon: Vanna Scotland, MD;  Location: [facility];  Service: Urology;  Laterality: N/A;   ESOPHAGOGASTRODUODENOSCOPY (EGD) WITH PROPOFOL N/A 03/04/2017   Procedure: ESOPHAGOGASTRODUODENOSCOPY (EGD) WITH PROPOFOL;  Surgeon: Toledo, Boykin Nearing, MD;  Location: [facility];  Service: Gastroenterology;  Laterality: N/A;   OPEN REDUCTION SHOULDER DISLOCATION  04/07/1974   PARTIAL COLECTOMY  04/07/2004   STONE EXTRACTION WITH BASKET N/A 06/13/2019   Procedure: STONE EXTRACTION WITH BASKET;  Surgeon: Vanna Scotland, MD;  Location: [facility];  Service: Urology;  Laterality: N/A;   TRANSURETHRAL RESECTION OF BLADDER TUMOR N/A 2014   TRANSURETHRAL RESECTION OF BLADDER TUMOR WITH GYRUS (TURBT-GYRUS) N/A 09/26/2013   Procedure: BLADDER BIOPSY;  Surgeon: Magdalene Molly, MD;  Location: [facility];  Service: Urology;  Laterality: N/A;    Home Medications:  Current Meds  Medication Sig   ALPRAZolam (XANAX) 0.5 MG tablet Take 0.5 mg by mouth 2 (two) times daily as needed for sleep or anxiety.   amiodarone (PACERONE) 200 MG tablet Take 200 mg by mouth at bedtime.   aspirin EC 81 MG tablet Take 81 mg by mouth at bedtime.    Cholecalciferol (VITAMIN D3) 2000 UNITS TABS Take 2,000 Units by mouth daily.   clopidogrel (PLAVIX) 75 MG tablet Take 75 mg by mouth at bedtime.    docusate sodium (COLACE) 100 MG capsule Take 100 mg by mouth 2 (two) times daily as needed for mild constipation or moderate constipation.   finasteride (PROSCAR) 5 MG tablet TAKE 1 TABLET BY MOUTH EVERY DAY   hydrochlorothiazide (HYDRODIURIL) 12.5 MG tablet Take 12.5 mg by mouth at bedtime.    HYDROcodone-acetaminophen (NORCO/VICODIN) 5-325 MG tablet Take 1 tablet by mouth every 6 (six) hours  as needed for moderate pain.   lisinopril (ZESTRIL) 10 MG tablet Take 10 mg by mouth at bedtime.   metoprolol succinate (TOPROL-XL) 50 MG 24 hr tablet Take 50 mg by mouth every evening. Take with or immediately following a meal.   oxyCODONE-acetaminophen (PERCOCET) 5-325 MG tablet Take 1 tablet by mouth every 8 (eight) hours as needed for up to 5 days for severe pain.   rosuvastatin (CRESTOR) 40 MG tablet Take 40 mg by mouth at bedtime.   tamsulosin (FLOMAX) 0.4 MG CAPS capsule Take 2 capsules (0.8 mg total) by mouth at bedtime.    Allergies:  Allergies  Allergen Reactions   Cephalosporins Diarrhea   Escitalopram Oxalate Other (See Comments)    lethargic    Family History  Problem Relation Age of Onset   Bladder Cancer Mother        dx 29s   Prostate cancer Brother        dx 49s   Cancer Maternal Grandmother        unk type   Kidney disease Neg Hx     Social History:  reports that he has been smoking cigarettes. He has a 32 pack-year smoking history. He has never used smokeless tobacco. He reports that he does not drink alcohol and does not use drugs.  ROS: A complete review of systems was performed.  All systems are negative except for pertinent findings as noted.  Physical Exam:  Vital signs in last 24 hours: Temp:  [97.9 F (36.6 C)-98.3 F (36.8 C)] 98.3 F (36.8 C) (09/13 1906) Pulse Rate:  [71-91] 81 (09/13 1906) Resp:  [16-19] 19 (09/13 1906) BP: (122-144)/(73-78) 144/74 (09/13 1906) SpO2:  [92 %-100 %] 95 % (09/13 1906) Weight:  [101.6 kg] 101.6 kg (09/13 1147) Constitutional:  Alert and oriented, No acute distress HEENT: [place] AT, moist mucus membranes.  Trachea midline, no masses Cardiovascular: Regular rate and rhythm Respiratory: Normal respiratory effort, lungs clear bilaterally Psychiatric: Normal mood and affect   Laboratory Data:  Recent Labs    12/19/22 1103  WBC 9.2  HGB 15.5  HCT 46.0   Recent Labs    12/19/22 1103  NA 138  K 3.6  CL 99   CO2 27  GLUCOSE 104*  BUN 38*  CREATININE 2.27*  CALCIUM 9.8    Radiologic Imaging: CT images were personally reviewed CT scans performed 12/14/2022 and 12/19/2022.  Right lower pole partial staghorn calculus without obstruction.  Right simple renal cyst.  Nonobstructing 10 mm left renal calculus and small lower calyceal calculi.  Faint enhancement of the left kidney noted as well as mild-moderate left hydronephrosis/hydroureter.  I calculated the prostate volume at 118 cc  CT Renal Stone Study  Result Date: 12/19/2022 CLINICAL DATA:  Left-sided flank pain EXAM: CT ABDOMEN AND PELVIS WITHOUT CONTRAST TECHNIQUE: Multidetector CT imaging of the abdomen and pelvis was performed following the standard protocol without IV contrast. RADIATION DOSE REDUCTION: This exam was performed according to the departmental dose-optimization program which includes automated exposure control, adjustment of the mA and/or kV according to patient size and/or use of iterative reconstruction technique. COMPARISON:  CT 5 days ago. FINDINGS: Lower chest: Mild linear opacity lung bases likely scar or atelectasis. No pleural effusion. Small air cysts along both lung bases. Coronary artery calcifications are seen. Hepatobiliary: Gallbladder is nondilated. Small cysts seen in segment 5. No imaging follow-up. Pancreas: Unremarkable. No pancreatic ductal dilatation or surrounding inflammatory changes. Spleen: Normal in size without focal abnormality. Adrenals/Urinary Tract: Adrenal glands are preserved. Increasing left perinephric stranding. There is persistent parenchymal enhancement of the left kidney. Please correlate with last contrast administration. There are multiple intrarenal stones identified. Largest measures 10 mm and was seen previously. Multiple smaller stones towards the lower pole. There is also dilatation of the collecting system down to the level of the distal ureter where there is a stone in the distal left ureter  measuring on coronal series 5, image 85 5 mm. There is a punctate stone just proximal to this has well. The collecting system dilatation is increased from previous. Previously the stone was in the very proximal ureter. There are multiple dependent stones in the urinary bladder. Up to 5. The number of bladder stones appears similar. Staghorn right-sided renal stones are seen, similar to previous. No right-sided collecting system dilatation. No right ureteral stone. Bosniak 1 anterior renal cyst again identified. No imaging follow-up. Bladder is nondilated. Stomach/Bowel: On this non oral contrast exam, the stomach is distended with the air. The small bowel is nondilated. Large bowel is nondilated. There is a redundant course of the sigmoid colon in the  upper abdomen on the right side. Surgical changes identified along the colon. Presumed subtotal left hemicolectomy. Vascular/Lymphatic: Scattered vascular calcifications along the aorta and branch vessels. Mild dilatation of the abdominal aorta measuring 3 cm. Normal caliber IVC. No specific abnormal lymph node enlargement seen in the abdomen and pelvis. Circumaortic left renal vein. Reproductive: Prostate measures 6.9 x 5.4 x 5.7 cm. Other: No free air or free fluid. Musculoskeletal: Degenerative changes of the spine and pelvis. IMPRESSION: Previous left UPJ stone measuring 5 mm is now along the distal left ureter with second more proximal smaller stone. Increasing left-sided renal collecting system dilatation. There also persistent enhancement of the left kidney. Please correlate for time of most recent contrast injection. Increasing perinephric fluid and stranding on the left. Additional nonobstructing left-sided renal stones. Staghorn calculi on the right. Persistent bladder stones. Enlarged prostate.  Please correlate with patient's PSA. 3 cm abdominal aortic aneurysm. Recommend follow-up ultrasound every 3 years. This recommendation follows ACR consensus  guidelines: White Paper of the ACR Incidental Findings Committee II on Vascular Findings. J Am Coll Radiol 2013; 10:789-794. Surgical changes along the colon. Coronary artery calcifications. Please correlate for other coronary risk factors Electronically Signed   By: Karen Kays M.D.   On: 12/19/2022 13:44    Impression/Assessment:  67 y.o. male with an obstructing left distal ureteral calculus and intermittent renal colic.  Significant obstruction present with persistent enhancement noted from prior contrast CT 9/8 He has developed AKI with potential contributing factors including obstruction, IV contrast administration on CT earlier this week and prerenal azotemia secondary to decreased p.o. intake this week  Recommendation:  He has been admitted to the hospitalist for hydration and pain control Follow creatinine He has eaten today and was eating dinner when I saw him; urgent/emergent management is not indicated Will plan cystoscopy with left ureteroscopy/laser lithotripsy/stone removal 12/20/2022.  The procedure was discussed including potential risks of bleeding, infection and ureteral injury/stricture and stent irritative symptoms.  He has small bladder calculi the majority of which should be able to be irrigated from the bladder but will perform cystolitholapaxy if needed We discussed his significant BPH may preclude ureteroscopic removal of his stones due to anatomy and could potentially prohibit ureteral stent placement.  If this occurred he would need percutaneous nephrostomy placement by IR All questions were answered and he desires to proceed   12/19/2022, 7:27 PM  Irineo Axon,  MD

## 2022-12-19 NOTE — ED Notes (Signed)
Arranged transport for pt. To go to general surgery.

## 2022-12-19 NOTE — H&P (Signed)
History and Physical    Jeffrey Buchanan:[id number] DOB: 11-18-1955 DOA: 12/19/2022  PCP: Jeffrey Ferrier, MD (Confirm with patient/family/NH records and if not entered, this has to be entered at [facility] point of entry) Patient coming from: Home  I have personally briefly reviewed patient's old medical records in [facility]  Chief Complaint: Left flank pain  HPI: Jeffrey Buchanan is a 67 y.o. male with medical history significant of recurrent kidney stone, CAD remote stenting on aspirin Plavix, chronic HFrEF LVEF 33%, frequent PVCs on amiodarone, HTN, BPH, presented with worsening of left-sided flank pain.  Symptoms started 5 days ago with sudden onset of left-sided flank pain radiating to the groin area associated with new onset of hematuria, which resemble previous obstructing ureteral stone, associated with nausea.  Patient came to ED and imaging study showed obstructing 3 x 5 ureteral stone at the left ureteropelvic junction with mild left-sided hydronephrosis and moderate perinephric edema and stranding.  Patient was given hydration IV fluid and sent home with Percocet and Zofran.  Over the last 4 days however patient continued to experience worsening of left-sided flank pain, although he described that the hematuria has turned clear which he thought he passed the stone.  Denies any fever chills continue to feel nausea but no vomiting. ED Course: Afebrile, nontacky tachycardia nonhypotensive.  Blood work showed WBC 9.2, creatinine 2.2 compared to baseline 1.0 of last year and 1.3 of September 8 this year.  Repeat CT renal stone soon showed left UPJ obstructing stone 5 mm moving down to distal left ureter along with a second more proximal smaller stone increasing the left-sided renal collecting system with dilation and stranding on the left kidney.  Patient was given IV hydration and pain medication in the ED  Review of Systems: As per HPI otherwise 14 point review of systems negative.     Past Medical History:  Diagnosis Date   Acute MI, inferior wall (HCC) 04/06/2003   a.) PCI 04/10/2003 --> 90% pRCA --> 3.5 x 18 mm and 4.0 x 23 mm Vision stents to RCA.   Anxiety    Arthritis    BPH (benign prostatic hyperplasia)    Chronic anticoagulation    a.) DAPT therapy (ASA + clopidogrel)   Chronic back pain    Coronary artery disease 04/10/2003   a.) LHC 04/10/2003 --> EF 40%; 90% pRCA, 40% x 2 mRCA, 30% and 20% pLCx; 3.5 x 18 mm and 4.0 x 23 mm Vision stents to RCA.   DJD (degenerative joint disease)    GERD (gastroesophageal reflux disease)    Headache    History of adenomatous polyp of colon    S/P  PARTIAL COLECTOMY 2006   History of kidney stones    HLD (hyperlipidemia)    Hyperlipidemia    Hypertension    Skin cancer    Urothelial carcinoma of bladder (HCC) 09/14/2012   a.) Bx 09/14/2012 --> pathology (+) for low grade papillary urothelial carcinoma.   Vitamin D deficiency     Past Surgical History:  Procedure Laterality Date   COLONOSCOPY WITH PROPOFOL N/A 03/04/2017   Procedure: COLONOSCOPY WITH PROPOFOL;  Surgeon: Toledo, Boykin Nearing, MD;  Location: [facility];  Service: Gastroenterology;  Laterality: N/A;   CORONARY ANGIOPLASTY WITH STENT PLACEMENT Left 04/10/2003   Procedure: CORONARY ANGIOPLASTY WITH STENT PLACEMENT (3.5 x 18 mm and 4.0 x 23 mm Vision stents to RCA); Location: [facility]; Surgeon: Arnoldo Hooker, MD   CYSTOSCOPY W/ RETROGRADES Bilateral 09/26/2013  Procedure: CYSTOSCOPY WITH BILATERAL RETROGRADE PYELOGRAM;  Surgeon: Magdalene Molly, MD;  Location: [facility];  Service: Urology;  Laterality: Bilateral;   CYSTOSCOPY WITH LITHOLAPAXY N/A 06/13/2019   Procedure: CYSTOSCOPY;  Surgeon: Vanna Scotland, MD;  Location: [facility];  Service: Urology;  Laterality: N/A;   CYSTOSCOPY WITH LITHOLAPAXY N/A 01/28/2021   Procedure: CYSTOSCOPY WITH LITHOLAPAXY;  Surgeon: Vanna Scotland, MD;  Location: [facility];  Service: Urology;   Laterality: N/A;   ESOPHAGOGASTRODUODENOSCOPY (EGD) WITH PROPOFOL N/A 03/04/2017   Procedure: ESOPHAGOGASTRODUODENOSCOPY (EGD) WITH PROPOFOL;  Surgeon: Toledo, Boykin Nearing, MD;  Location: [facility];  Service: Gastroenterology;  Laterality: N/A;   OPEN REDUCTION SHOULDER DISLOCATION  04/07/1974   PARTIAL COLECTOMY  04/07/2004   STONE EXTRACTION WITH BASKET N/A 06/13/2019   Procedure: STONE EXTRACTION WITH BASKET;  Surgeon: Vanna Scotland, MD;  Location: [facility];  Service: Urology;  Laterality: N/A;   TRANSURETHRAL RESECTION OF BLADDER TUMOR N/A 2014   TRANSURETHRAL RESECTION OF BLADDER TUMOR WITH GYRUS (TURBT-GYRUS) N/A 09/26/2013   Procedure: BLADDER BIOPSY;  Surgeon: Magdalene Molly, MD;  Location: [facility];  Service: Urology;  Laterality: N/A;     reports that he has been smoking cigarettes. He has a 32 pack-year smoking history. He has never used smokeless tobacco. He reports that he does not drink alcohol and does not use drugs.  Allergies  Allergen Reactions   Cephalosporins Diarrhea   Escitalopram Oxalate Other (See Comments)    lethargic    Family History  Problem Relation Age of Onset   Bladder Cancer Mother        dx 26s   Prostate cancer Brother        dx 4s   Cancer Maternal Grandmother        unk type   Kidney disease Neg Hx      Prior to Admission medications   Medication Sig Start Date End Date Taking? Authorizing Provider  ALPRAZolam Prudy Feeler) 0.5 MG tablet Take 0.5 mg by mouth 2 (two) times daily as needed for sleep or anxiety.   Yes [provider]  amiodarone (PACERONE) 200 MG tablet Take 200 mg by mouth at bedtime.   Yes [provider]  aspirin EC 81 MG tablet Take 81 mg by mouth at bedtime.    Yes [provider]  Cholecalciferol (VITAMIN D3) 2000 UNITS TABS Take 2,000 Units by mouth daily.   Yes [provider]  clopidogrel (PLAVIX) 75 MG tablet Take 75 mg by mouth at bedtime.  01/29/16  Yes  [provider]  docusate sodium (COLACE) 100 MG capsule Take 100 mg by mouth 2 (two) times daily as needed for mild constipation or moderate constipation.   Yes [provider]  finasteride (PROSCAR) 5 MG tablet TAKE 1 TABLET BY MOUTH EVERY DAY 04/03/22  Yes Stoioff, Verna Czech, MD  hydrochlorothiazide (HYDRODIURIL) 12.5 MG tablet Take 12.5 mg by mouth at bedtime.  12/31/15  Yes [provider]  HYDROcodone-acetaminophen (NORCO/VICODIN) 5-325 MG tablet Take 1 tablet by mouth every 6 (six) hours as needed for moderate pain.   Yes [provider]  lisinopril (ZESTRIL) 10 MG tablet Take 10 mg by mouth at bedtime.   Yes [provider]  metoprolol succinate (TOPROL-XL) 50 MG 24 hr tablet Take 50 mg by mouth every evening. Take with or immediately following a meal.   Yes [provider]  oxyCODONE-acetaminophen (PERCOCET) 5-325 MG tablet Take 1 tablet by mouth every 8 (eight) hours as  needed for up to 5 days for severe pain. 12/14/22 12/19/22 Yes Menshew, Charlesetta Ivory, PA-C  rosuvastatin (CRESTOR) 40 MG tablet Take 40 mg by mouth at bedtime. 04/25/19  Yes [provider]  tamsulosin (FLOMAX) 0.4 MG CAPS capsule Take 2 capsules (0.8 mg total) by mouth at bedtime. 11/18/22  Yes Vanna Scotland, MD  ondansetron (ZOFRAN-ODT) 4 MG disintegrating tablet Take 1 tablet (4 mg total) by mouth every 8 (eight) hours as needed for nausea or vomiting. Patient not taking: Reported on 12/19/2022 12/14/22   Lissa Hoard, PA-C    Physical Exam: Vitals:   12/19/22 1102 12/19/22 1147 12/19/22 1421  BP: 129/73  (!) 143/78  Pulse: 82  71  Resp: 18  16  Temp: 98 F (36.7 C)    SpO2: 100%  92%  Weight:  101.6 kg   Height:  6\' 2"  (1.88 m)     Constitutional: NAD, calm, comfortable Vitals:   12/19/22 1102 12/19/22 1147 12/19/22 1421  BP: 129/73  (!) 143/78  Pulse: 82  71  Resp: 18  16  Temp: 98 F (36.7 C)    SpO2: 100%  92%  Weight:  101.6 kg    Height:  6\' 2"  (1.88 m)    Eyes: PERRL, lids and conjunctivae normal ENMT: Mucous membranes are moist. Posterior pharynx clear of any exudate or lesions.Normal dentition.  Neck: normal, supple, no masses, no thyromegaly Respiratory: clear to auscultation bilaterally, no wheezing, no crackles. Normal respiratory effort. No accessory muscle use.  Cardiovascular: Regular rate and rhythm, no murmurs / rubs / gallops. No extremity edema. 2+ pedal pulses. No carotid bruits.  Abdomen: Tenderness on left CVA, no masses palpated. No hepatosplenomegaly. Bowel sounds positive.  Musculoskeletal: no clubbing / cyanosis. No joint deformity upper and lower extremities. Good ROM, no contractures. Normal muscle tone.  Skin: no rashes, lesions, ulcers. No induration Neurologic: CN 2-12 grossly intact. Sensation intact, DTR normal. Strength 5/5 in all 4.  Psychiatric: Normal judgment and insight. Alert and oriented x 3. Normal mood.     Labs on Admission: I have personally reviewed following labs and imaging studies  CBC: Recent Labs  Lab 12/14/22 1404 12/19/22 1103  WBC 10.1 9.2  NEUTROABS 8.4*  --   HGB 16.8 15.5  HCT 50.2 46.0  MCV 90.5 91.3  PLT 168 175   Basic Metabolic Panel: Recent Labs  Lab 12/14/22 1404 12/19/22 1103  NA 138 138  K 3.7 3.6  CL 102 99  CO2 24 27  GLUCOSE 120* 104*  BUN 20 38*  CREATININE 1.30* 2.27*  CALCIUM 9.6 9.8   GFR: Estimated Creatinine Clearance: 40.7 mL/min (A) (by C-G formula based on SCr of 2.27 mg/dL (H)). Liver Function Tests: Recent Labs  Lab 12/14/22 1404  AST 25  ALT 21  ALKPHOS 64  BILITOT 0.7  PROT 8.0  ALBUMIN 4.4   Recent Labs  Lab 12/14/22 1404  LIPASE 31   No results for input(s): "AMMONIA" in the last 168 hours. Coagulation Profile: No results for input(s): "INR", "PROTIME" in the last 168 hours. Cardiac Enzymes: No results for input(s): "CKTOTAL", "CKMB", "CKMBINDEX", "TROPONINI" in the last 168 hours. BNP (last 3  results) No results for input(s): "PROBNP" in the last 8760 hours. HbA1C: No results for input(s): "HGBA1C" in the last 72 hours. CBG: No results for input(s): "GLUCAP" in the last 168 hours. Lipid Profile: No results for input(s): "CHOL", "HDL", "LDLCALC", "TRIG", "CHOLHDL", "LDLDIRECT" in the last 72 hours. Thyroid  Function Tests: No results for input(s): "TSH", "T4TOTAL", "FREET4", "T3FREE", "THYROIDAB" in the last 72 hours. Anemia Panel: No results for input(s): "VITAMINB12", "FOLATE", "FERRITIN", "TIBC", "IRON", "RETICCTPCT" in the last 72 hours. Urine analysis:    Component Value Date/Time   COLORURINE YELLOW (A) 12/19/2022 1103   APPEARANCEUR HAZY (A) 12/19/2022 1103   APPEARANCEUR Clear 04/24/2022 0853   LABSPEC 1.019 12/19/2022 1103   LABSPEC 1.025 03/07/2013 1658   PHURINE 5.0 12/19/2022 1103   GLUCOSEU NEGATIVE 12/19/2022 1103   GLUCOSEU Negative 03/07/2013 1658   HGBUR LARGE (A) 12/19/2022 1103   BILIRUBINUR NEGATIVE 12/19/2022 1103   BILIRUBINUR Negative 04/24/2022 0853   BILIRUBINUR Negative 03/07/2013 1658   KETONESUR NEGATIVE 12/19/2022 1103   PROTEINUR NEGATIVE 12/19/2022 1103   NITRITE NEGATIVE 12/19/2022 1103   LEUKOCYTESUR TRACE (A) 12/19/2022 1103   LEUKOCYTESUR Negative 03/07/2013 1658    Radiological Exams on Admission: CT Renal Stone Study  Result Date: 12/19/2022 CLINICAL DATA:  Left-sided flank pain EXAM: CT ABDOMEN AND PELVIS WITHOUT CONTRAST TECHNIQUE: Multidetector CT imaging of the abdomen and pelvis was performed following the standard protocol without IV contrast. RADIATION DOSE REDUCTION: This exam was performed according to the departmental dose-optimization program which includes automated exposure control, adjustment of the mA and/or kV according to patient size and/or use of iterative reconstruction technique. COMPARISON:  CT 5 days ago. FINDINGS: Lower chest: Mild linear opacity lung bases likely scar or atelectasis. No pleural effusion.  Small air cysts along both lung bases. Coronary artery calcifications are seen. Hepatobiliary: Gallbladder is nondilated. Small cysts seen in segment 5. No imaging follow-up. Pancreas: Unremarkable. No pancreatic ductal dilatation or surrounding inflammatory changes. Spleen: Normal in size without focal abnormality. Adrenals/Urinary Tract: Adrenal glands are preserved. Increasing left perinephric stranding. There is persistent parenchymal enhancement of the left kidney. Please correlate with last contrast administration. There are multiple intrarenal stones identified. Largest measures 10 mm and was seen previously. Multiple smaller stones towards the lower pole. There is also dilatation of the collecting system down to the level of the distal ureter where there is a stone in the distal left ureter measuring on coronal series 5, image 85 5 mm. There is a punctate stone just proximal to this has well. The collecting system dilatation is increased from previous. Previously the stone was in the very proximal ureter. There are multiple dependent stones in the urinary bladder. Up to 5. The number of bladder stones appears similar. Staghorn right-sided renal stones are seen, similar to previous. No right-sided collecting system dilatation. No right ureteral stone. Bosniak 1 anterior renal cyst again identified. No imaging follow-up. Bladder is nondilated. Stomach/Bowel: On this non oral contrast exam, the stomach is distended with the air. The small bowel is nondilated. Large bowel is nondilated. There is a redundant course of the sigmoid colon in the upper abdomen on the right side. Surgical changes identified along the colon. Presumed subtotal left hemicolectomy. Vascular/Lymphatic: Scattered vascular calcifications along the aorta and branch vessels. Mild dilatation of the abdominal aorta measuring 3 cm. Normal caliber IVC. No specific abnormal lymph node enlargement seen in the abdomen and pelvis. Circumaortic left  renal vein. Reproductive: Prostate measures 6.9 x 5.4 x 5.7 cm. Other: No free air or free fluid. Musculoskeletal: Degenerative changes of the spine and pelvis. IMPRESSION: Previous left UPJ stone measuring 5 mm is now along the distal left ureter with second more proximal smaller stone. Increasing left-sided renal collecting system dilatation. There also persistent enhancement of the left kidney. Please correlate  for time of most recent contrast injection. Increasing perinephric fluid and stranding on the left. Additional nonobstructing left-sided renal stones. Staghorn calculi on the right. Persistent bladder stones. Enlarged prostate.  Please correlate with patient's PSA. 3 cm abdominal aortic aneurysm. Recommend follow-up ultrasound every 3 years. This recommendation follows ACR consensus guidelines: White Paper of the ACR Incidental Findings Committee II on Vascular Findings. J Am Coll Radiol 2013; 10:789-794. Surgical changes along the colon. Coronary artery calcifications. Please correlate for other coronary risk factors Electronically Signed   By: Karen Kays M.D.   On: 12/19/2022 13:44    EKG: Independently reviewed.  Sinus, chronic LBBB  Assessment/Plan Principal Problem:   AKI (acute kidney injury) (HCC) Active Problems:   Acute pyelonephritis   Obstruction of left ureter   Left ureteral stone  (please populate well all problems here in Problem List. (For example, if patient is on BP meds at home and you resume or decide to hold them, it is a problem that needs to be her. Same for CAD, COPD, HLD and so on)  Acute left obstructing ureteral stone -N.p.o. after midnight, urology plans for stenting tomorrow -Strain all urine  Acute left-sided complicated UTI/acute left-sided pyelonephritis -Allergic to cephalosporin, consult pharmacy for renal dosed Levaquin -Short course IV hydration monitor volume status  AKI -Postrenal secondary to obstructive left ureteral stone and  hydronephrosis -Gentle hydration x 8 hours, reevaluate kidney function and chest x-ray tomorrow -Hold off ACEI and hydrochlorothiazide  Chronic HFrEF -Mild volume contraction-euvolemic -Short course IV hydration to help AKI -Hold off lisinopril and hydrochlorothiazide -Continue metoprolol  CAD with RCA stenting -Last stent was placed in more than 10 years ago, plan to continue aspirin and hold off Plavix.  History of frequent PVCs -On amiodarone  BPH -Continue Flomax and finasteride  DVT prophylaxis: SCD Code Status: Full code Family Communication: None at bedside Disposition Plan: Patient is sick with AKI and obstructive ureteral stone and pyelonephritis, requiring IV antibiotics and inpatient urology consult, expect more than 2 midnight hospital stay Consults called: Urology Admission status: MedSurg admission   Emeline General MD [facility] Pager [phone]  12/19/2022, 5:00 PM

## 2022-12-20 ENCOUNTER — Inpatient Hospital Stay: Payer: PPO | Admitting: Anesthesiology

## 2022-12-20 ENCOUNTER — Encounter
Admission: EM | Disposition: A | Discharge status: Home / Self Care | Payer: Self-pay | Source: Home / Self Care | Attending: Emergency Medicine

## 2022-12-20 ENCOUNTER — Inpatient Hospital Stay: Payer: PPO

## 2022-12-20 ENCOUNTER — Inpatient Hospital Stay: Payer: Self-pay | Admitting: Anesthesiology

## 2022-12-20 DIAGNOSIS — N210 Calculus in bladder: Secondary | ICD-10-CM | POA: Diagnosis not present

## 2022-12-20 DIAGNOSIS — I110 Hypertensive heart disease with heart failure: Secondary | ICD-10-CM | POA: Diagnosis not present

## 2022-12-20 DIAGNOSIS — N201 Calculus of ureter: Secondary | ICD-10-CM | POA: Diagnosis not present

## 2022-12-20 DIAGNOSIS — N179 Acute kidney failure, unspecified: Secondary | ICD-10-CM | POA: Diagnosis not present

## 2022-12-20 DIAGNOSIS — I509 Heart failure, unspecified: Secondary | ICD-10-CM | POA: Diagnosis not present

## 2022-12-20 DIAGNOSIS — R918 Other nonspecific abnormal finding of lung field: Secondary | ICD-10-CM | POA: Diagnosis not present

## 2022-12-20 DIAGNOSIS — I2510 Atherosclerotic heart disease of native coronary artery without angina pectoris: Secondary | ICD-10-CM | POA: Diagnosis not present

## 2022-12-20 DIAGNOSIS — F17210 Nicotine dependence, cigarettes, uncomplicated: Secondary | ICD-10-CM | POA: Diagnosis not present

## 2022-12-20 HISTORY — PX: CYSTOSCOPY WITH LITHOLAPAXY: SHX1425

## 2022-12-20 HISTORY — PX: CYSTOSCOPY/URETEROSCOPY/HOLMIUM LASER/STENT PLACEMENT: SHX6546

## 2022-12-20 LAB — BASIC METABOLIC PANEL
Anion gap: 11 (ref 5–15)
BUN: 36 mg/dL — ABNORMAL HIGH (ref 8–23)
CO2: 26 mmol/L (ref 22–32)
Calcium: 8.9 mg/dL (ref 8.9–10.3)
Chloride: 102 mmol/L (ref 98–111)
Creatinine, Ser: 2.11 mg/dL — ABNORMAL HIGH (ref 0.61–1.24)
GFR, Estimated: 34 mL/min — ABNORMAL LOW (ref >60)
Glucose, Bld: 101 mg/dL — ABNORMAL HIGH (ref 70–99)
Potassium: 3.6 mmol/L (ref 3.5–5.1)
Sodium: 139 mmol/L (ref 135–145)

## 2022-12-20 LAB — CBC
HCT: 42.9 % (ref 39.0–52.0)
Hemoglobin: 14.1 g/dL (ref 13.0–17.0)
MCH: 30.1 pg (ref 26.0–34.0)
MCHC: 32.9 g/dL (ref 30.0–36.0)
MCV: 91.5 fL (ref 80.0–100.0)
Platelets: 166 10*3/uL (ref 150–400)
RBC: 4.69 MIL/uL (ref 4.22–5.81)
RDW: 13.2 % (ref 11.5–15.5)
WBC: 7.7 10*3/uL (ref 4.0–10.5)
nRBC: 0 % (ref 0.0–0.2)

## 2022-12-20 LAB — HIV ANTIBODY (ROUTINE TESTING W REFLEX): HIV Screen 4th Generation wRfx: NONREACTIVE

## 2022-12-20 LAB — SURGICAL PCR SCREEN
MRSA, PCR: NEGATIVE
Staphylococcus aureus: NEGATIVE

## 2022-12-20 SURGERY — CYSTOSCOPY/URETEROSCOPY/HOLMIUM LASER/STENT PLACEMENT
Anesthesia: General

## 2022-12-20 MED ORDER — FENTANYL CITRATE (PF) 100 MCG/2ML IJ SOLN
INTRAMUSCULAR | Status: DC | PRN
  Administered 2022-12-20 (×2): 50 ug via INTRAVENOUS

## 2022-12-20 MED ORDER — MIDAZOLAM HCL 2 MG/2ML IJ SOLN
INTRAMUSCULAR | Status: AC
  Filled 2022-12-20: qty 2

## 2022-12-20 MED ORDER — DEXAMETHASONE SODIUM PHOSPHATE 10 MG/ML IJ SOLN
INTRAMUSCULAR | Status: DC | PRN
  Administered 2022-12-20: 10 mg via INTRAVENOUS

## 2022-12-20 MED ORDER — KETOROLAC TROMETHAMINE 30 MG/ML IJ SOLN
INTRAMUSCULAR | Status: AC
  Filled 2022-12-20: qty 1

## 2022-12-20 MED ORDER — LIDOCAINE HCL (PF) 2 % IJ SOLN
INTRAMUSCULAR | Status: AC
  Filled 2022-12-20: qty 5

## 2022-12-20 MED ORDER — LACTATED RINGERS IV SOLN: INTRAVENOUS | Status: DC | PRN

## 2022-12-20 MED ORDER — ROCURONIUM BROMIDE 100 MG/10ML IV SOLN
INTRAVENOUS | Status: DC | PRN
  Administered 2022-12-20: 60 mg via INTRAVENOUS

## 2022-12-20 MED ORDER — MIDAZOLAM HCL 2 MG/2ML IJ SOLN
INTRAMUSCULAR | Status: DC | PRN
  Administered 2022-12-20: 2 mg via INTRAVENOUS

## 2022-12-20 MED ORDER — OXYBUTYNIN CHLORIDE 5 MG PO TABS: 5.0000 mg | ORAL_TABLET | Freq: Three times a day (TID) | ORAL | 0 refills | Status: DC | PRN

## 2022-12-20 MED ORDER — PROPOFOL 10 MG/ML IV BOLUS
INTRAVENOUS | Status: DC | PRN
  Administered 2022-12-20: 150 mg via INTRAVENOUS

## 2022-12-20 MED ORDER — OXYCODONE HCL 5 MG PO TABS: 5.0000 mg | ORAL_TABLET | Freq: Once | ORAL | Status: DC | PRN

## 2022-12-20 MED ORDER — FENTANYL CITRATE (PF) 100 MCG/2ML IJ SOLN: 25.0000 ug | INTRAMUSCULAR | Status: DC | PRN

## 2022-12-20 MED ORDER — ACETAMINOPHEN 10 MG/ML IV SOLN
INTRAVENOUS | Status: AC
  Filled 2022-12-20: qty 100

## 2022-12-20 MED ORDER — ROCURONIUM BROMIDE 10 MG/ML (PF) SYRINGE
PREFILLED_SYRINGE | INTRAVENOUS | Status: AC
  Filled 2022-12-20: qty 10

## 2022-12-20 MED ORDER — OXYBUTYNIN CHLORIDE 5 MG PO TABS: 5.0000 mg | ORAL_TABLET | Freq: Three times a day (TID) | ORAL | Status: DC | PRN

## 2022-12-20 MED ORDER — ACETAMINOPHEN 10 MG/ML IV SOLN
INTRAVENOUS | Status: DC | PRN
  Administered 2022-12-20: 1000 mg via INTRAVENOUS

## 2022-12-20 MED ORDER — IOHEXOL 180 MG/ML  SOLN
INTRAMUSCULAR | Status: DC | PRN
  Administered 2022-12-20: 30 mL

## 2022-12-20 MED ORDER — ONDANSETRON HCL 4 MG/2ML IJ SOLN
INTRAMUSCULAR | Status: AC
  Filled 2022-12-20: qty 2

## 2022-12-20 MED ORDER — FENTANYL CITRATE (PF) 100 MCG/2ML IJ SOLN
INTRAMUSCULAR | Status: AC
  Filled 2022-12-20: qty 2

## 2022-12-20 MED ORDER — SODIUM CHLORIDE 0.9 % IR SOLN
Status: DC | PRN
  Administered 2022-12-20: 3000 mL via INTRAVESICAL

## 2022-12-20 MED ORDER — DEXAMETHASONE SODIUM PHOSPHATE 10 MG/ML IJ SOLN
INTRAMUSCULAR | Status: AC
  Filled 2022-12-20: qty 1

## 2022-12-20 MED ORDER — ONDANSETRON HCL 4 MG/2ML IJ SOLN
INTRAMUSCULAR | Status: DC | PRN
  Administered 2022-12-20: 4 mg via INTRAVENOUS

## 2022-12-20 MED ORDER — OXYCODONE HCL 5 MG/5ML PO SOLN: 5.0000 mg | Freq: Once | ORAL | Status: DC | PRN

## 2022-12-20 MED ORDER — SUGAMMADEX SODIUM 500 MG/5ML IV SOLN
INTRAVENOUS | Status: DC | PRN
  Administered 2022-12-20: 400 mg via INTRAVENOUS

## 2022-12-20 MED ORDER — PROPOFOL 10 MG/ML IV BOLUS
INTRAVENOUS | Status: AC
  Filled 2022-12-20: qty 20

## 2022-12-20 MED ORDER — KETOROLAC TROMETHAMINE 30 MG/ML IJ SOLN
INTRAMUSCULAR | Status: DC | PRN
  Administered 2022-12-20: 30 mg via INTRAVENOUS

## 2022-12-20 MED ORDER — LIDOCAINE HCL (CARDIAC) PF 100 MG/5ML IV SOSY
PREFILLED_SYRINGE | INTRAVENOUS | Status: DC | PRN
  Administered 2022-12-20: 100 mg via INTRAVENOUS

## 2022-12-20 SURGICAL SUPPLY — 35 items
BAG DRAIN SIEMENS DORNER NS (MISCELLANEOUS) ×2 IMPLANT
BAG DRN NS LF (MISCELLANEOUS) ×2
BAG DRN RND TRDRP ANRFLXCHMBR (UROLOGICAL SUPPLIES)
BAG URINE DRAIN 2000ML AR STRL (UROLOGICAL SUPPLIES) IMPLANT
BASKET ZERO TIP 1.9FR (BASKET) IMPLANT
BRUSH SCRUB EZ 1% IODOPHOR (MISCELLANEOUS) ×2 IMPLANT
BSKT STON RTRVL ZERO TP 1.9FR (BASKET) ×2
CATH FOL 2WAY LX 18X30 (CATHETERS) IMPLANT
CATH URET FLEX-TIP 2 LUMEN 10F (CATHETERS) IMPLANT
CATH URETL OPEN END 6X70 (CATHETERS) IMPLANT
CNTNR URN SCR LID CUP LEK RST (MISCELLANEOUS) IMPLANT
CONT SPEC 4OZ STRL OR WHT (MISCELLANEOUS) ×2
DRAPE UTILITY 15X26 TOWEL STRL (DRAPES) ×2 IMPLANT
FIBER LASER MOSES 200 DFL (Laser) IMPLANT
GLOVE BIOGEL PI IND STRL 7.5 (GLOVE) ×2 IMPLANT
GOWN STRL REUS W/ TWL LRG LVL3 (GOWN DISPOSABLE) ×4 IMPLANT
GOWN STRL REUS W/ TWL XL LVL3 (GOWN DISPOSABLE) ×2 IMPLANT
GOWN STRL REUS W/TWL LRG LVL3 (GOWN DISPOSABLE) ×4
GOWN STRL REUS W/TWL XL LVL3 (GOWN DISPOSABLE) ×2
GUIDEWIRE STR DUAL SENSOR (WIRE) ×2 IMPLANT
IV NS IRRIG 3000ML ARTHROMATIC (IV SOLUTION) ×4 IMPLANT
KIT TURNOVER CYSTO (KITS) ×2 IMPLANT
PACK CYSTO AR (MISCELLANEOUS) ×2 IMPLANT
SET CYSTO W/LG BORE CLAMP LF (SET/KITS/TRAYS/PACK) ×2 IMPLANT
SET IRRIG Y TYPE TUR BLADDER L (SET/KITS/TRAYS/PACK) ×2 IMPLANT
SHEATH NAVIGATOR HD 12/14X36 (SHEATH) IMPLANT
STENT URET 6FRX24 CONTOUR (STENTS) IMPLANT
STENT URET 6FRX26 CONTOUR (STENTS) IMPLANT
SURGILUBE 2OZ TUBE FLIPTOP (MISCELLANEOUS) ×2 IMPLANT
SYR TOOMEY IRRIG 70ML (MISCELLANEOUS) ×2
SYRINGE TOOMEY IRRIG 70ML (MISCELLANEOUS) ×2 IMPLANT
VALVE UROSEAL ADJ ENDO (VALVE) IMPLANT
WATER STERILE IRR 1000ML POUR (IV SOLUTION) ×2 IMPLANT
WATER STERILE IRR 3000ML UROMA (IV SOLUTION) IMPLANT
WATER STERILE IRR 500ML POUR (IV SOLUTION) ×2 IMPLANT

## 2022-12-20 NOTE — Anesthesia Procedure Notes (Signed)
Procedure Name: Intubation Date/Time: 12/20/2022 11:07 AM  Performed by: Katherine Basset, CRNAPre-anesthesia Checklist: Patient identified, Emergency Drugs available, Suction available and Patient being monitored Patient Re-evaluated:Patient Re-evaluated prior to induction Oxygen Delivery Method: Circle system utilized Preoxygenation: Pre-oxygenation with 100% oxygen Induction Type: IV induction Ventilation: Mask ventilation without difficulty and Oral airway inserted - appropriate to patient size Laryngoscope Size: Hyacinth Meeker and 2 Grade View: Grade I Tube type: Oral Tube size: 7.0 mm Number of attempts: 1 Airway Equipment and Method: Stylet, Oral airway and Bite block Placement Confirmation: ETT inserted through vocal cords under direct vision, positive ETCO2 and breath sounds checked- equal and bilateral Secured at: 22 cm Tube secured with: Tape Dental Injury: Teeth and Oropharynx as per pre-operative assessment

## 2022-12-20 NOTE — Op Note (Signed)
   Preoperative diagnosis:  Left ureteral calculi Bladder calculi  Postoperative diagnosis:  Same  Procedure:  Cystoscopy Left ureteroscopy and stone removal Ureteroscopic laser lithotripsy Left ureteral stent placement (91F/26 cm) Left retrograde pyelography with interpretation Cystolitholapaxy (< 2 cm)  Surgeon: Lorin Picket C. Kaisei Gilbo, M.D.  Anesthesia: General  Complications: None  Intraoperative findings:  Cystoscopy-urethra without abnormality/stricture; prominent lateral lobe enlargement with bladder neck elevation; UOs normal-appearing bilaterally; moderate trabeculation with cellules; at least 7 bladder calculi bladder base the majority measuring ~ 5 mm Ureteroscopy-distal ureteral calculus measuring ~ 5 mm with a smaller 3 mm calculus just proximal Left retrograde pyelography post procedure showed no filling defects, stone fragments or contrast extravasation  EBL: Minimal  Specimens: Calculus fragments for analysis   Indication: Jeffrey Buchanan is a 67 y.o. male with an obstructing left distal ureteral calculus with hydronephrosis and an elevated creatinine.  He also has multiple bladder calculi.  After reviewing the management options for treatment, the patient elected to proceed with the above surgical procedure(s). We have discussed the potential benefits and risks of the procedure, side effects of the proposed treatment, the likelihood of the patient achieving the goals of the procedure, and any potential problems that might occur during the procedure or recuperation. Informed consent has been obtained.  Description of procedure:  The patient was taken to the operating room and general anesthesia was induced.  The patient was placed in the dorsal lithotomy position, prepped and draped in the usual sterile fashion, and preoperative antibiotics were administered. A preoperative time-out was performed.   A 21 French cystoscope was lubricated, placed per urethra and  advanced proximally under direct vision with findings as described above.    Attention was directed to the left ureteral orifice and a 0.038 Sensor wire met resistance just within the UO.  A 58F open-ended ureteral catheter was then placed at the UO and the guidewire was able to be advanced passed the calculus into the renal pelvis under fluoroscopic guidance.  The ureteral catheter and cystoscope were removed.  A 4.5 Fr semirigid ureteroscope was then advanced into the ureter next to the guidewire and the calculus was identified.  The stone was then fragmented with a 200 m Moses holmium laser fiber at a setting of 0.3 J/40 hz.   All fragments were then removed from the ureter with a zero tip nitinol basket.  The smaller calculus just proximal to the leading calculus was removed with the basket.  Reinspection of the ureter revealed no remaining visible stones or fragments.   Retrograde pyelogram was performed with findings as described above.  A 6 FR/26 CM Contour ureteral stent was placed under fluoroscopic guidance.  The wire was then removed with an adequate stent curl noted proximally as well as in the bladder.  The cystoscope was repassed and only a few of the bladder calculi were able to be removed from the bladder via irrigation.  The 200 m holmium laser fiber was then placed through the cystoscope and the remaining calculi were fragmented at a setting of 0.3J/120 Hz.  All fragments were then removed from the bladder via irrigation.  Cystoscopy showed no remaining fragments.  The bladder was then emptied and the procedure ended.  The patient appeared to tolerate the procedure well and without complications.  After anesthetic reversal the patient was transported to the PACU in stable condition.   Plan: Follow-up office visit for ureteral stent removal ~ 1 week   Irineo Axon, MD

## 2022-12-20 NOTE — Plan of Care (Signed)
Patient adequate for discharge. Resolve plan of care.  Jeffrey Buchanan

## 2022-12-20 NOTE — Anesthesia Postprocedure Evaluation (Signed)
Anesthesia Post Note  Patient: Jeffrey Buchanan  Procedure(s) Performed: CYSTOSCOPY/URETEROSCOPY/HOLMIUM LASER/STENT PLACEMENT (Left) CYSTOSCOPY WITH LITHOLAPAXY for Bladder Stone  Patient location during evaluation: PACU Anesthesia Type: General Level of consciousness: awake and alert Pain management: pain level controlled Vital Signs Assessment: post-procedure vital signs reviewed and stable Respiratory status: spontaneous breathing, nonlabored ventilation, respiratory function stable and patient connected to nasal cannula oxygen Cardiovascular status: blood pressure returned to baseline and stable Postop Assessment: no apparent nausea or vomiting Anesthetic complications: no  No notable events documented.   Last Vitals:  Vitals:   12/20/22 1245 12/20/22 1323  BP: (!) 144/64 135/82  Pulse: (!) 55 (!) 52  Resp: 18 18  Temp: (!) 36.4 C (!) 36.4 C  SpO2: 96% 96%    Last Pain:  Vitals:   12/20/22 1323  TempSrc: Axillary  PainSc:                  Stephanie Coup

## 2022-12-20 NOTE — Discharge Instructions (Addendum)
DISCHARGE INSTRUCTIONS FOR KIDNEY STONE/URETERAL STENT   MEDICATIONS:  1. Resume all your other meds from home.  2.  AZO (over-the-counter) can help with the burning/stinging when you urinate. 3.  Oxybutynin is for bladder spasm/stent irritation, Rx was sent to your pharmacy.  ACTIVITY:  1. May resume regular activities in 24 hours. 2. No driving while on narcotic pain medications  3. Drink plenty of water  4. Continue to walk at home - you can still get blood clots when you are at home, so keep active, but don't over do it.  5. May return to work/school tomorrow or when you feel ready    SIGNS/SYMPTOMS TO CALL:  Common postoperative symptoms include urinary frequency, urgency, bladder spasm and blood in the urine  Please call us if you have a fever greater than 101.5, uncontrolled nausea/vomiting, uncontrolled pain, dizziness, unable to urinate, excessively bloody urine, chest pain, shortness of breath, leg swelling, leg pain, or any other concerns or questions.   You can reach [place] at [phone].   FOLLOW-UP:  1. You will be contacted by our office for a follow-up appointment for stent removal  Your lisinopril and hydrochlorothiazide have been held for now. Follow-up with PCP Dr. Daniel Nones and get metabolic panel checked in a week and discuss with PCP when both meds can be resumed

## 2022-12-20 NOTE — Discharge Summary (Signed)
Physician Discharge Summary   Patient: Jeffrey Buchanan MRN: [id number] DOB: [birth year]  Admit date:     12/19/2022  Discharge date: 12/20/22  Discharge Physician: Enedina Finner   PCP: Lynnea Ferrier, MD   Recommendations at discharge:    Your lisinopril and hydrochlorothiazide have been held for now. Follow-up with PCP Dr. Daniel Nones and get metabolic panel checked in a week and discuss with PCP when both meds can be resumed follow-up with Dr. Daniel Nones in 1 week and get metabolic panel checked follow-up with Dr. Lonna Cobb on one week post procedure and possible stent removal  Discharge Diagnoses: Principal Problem:   AKI (acute kidney injury) [facility]) Active Problems:   Acute pyelonephritis   Obstruction of left ureter   Left ureteral stone  [employer] is a 67 y.o. male with medical history significant of recurrent kidney stone, CAD remote stenting on aspirin Plavix, chronic HFrEF LVEF 33%, frequent PVCs on amiodarone, HTN, BPH, presented with worsening of left-sided flank pain.   Symptoms started 5 days ago with sudden onset of left-sided flank pain radiating to the groin area associated with new onset of hematuria, which resemble previous obstructing ureteral stone, associated with nausea.    CT renal stone showed left UPJ obstructing stone 5 mm moving down to distal left ureter along with a second more proximal smaller stone increasing the left-sided renal collecting system with dilation and stranding on the left kidney.   Acute left obstructing ureteral stone  Acute left-sided complicated UTI/acute left-sided pyelonephritis -Allergic to cephalosporin, consult pharmacy for renal dosed Levaquin -Short course IV hydration monitor volume status -- patient was seen by urology Dr. Lonna Cobb. He underwent Cystoscopy Left ureteroscopy and stone removal Ureteroscopic laser lithotripsy Left ureteral stent placement (63F/26 cm) Left retrograde pyelography with  interpretation Cystolitholapaxy (< 2 cm) -- per Dr. Lonna Cobb okay to discharge home. Patient and wife agreeable. No indication for antibiotics per Dr. Lonna Cobb. Patient is afebrile white count is normal.\ -- He will follow-up with urology in one week for stent removal   AKI -Postrenal secondary to obstructive left ureteral stone and hydronephrosis -Gentle hydration x 8 hours, reevaluate kidney function and chest x-ray tomorrow -Hold off ACEI and hydrochlorothiazide at discharge for now till creatinine normalizes. Patient will get metabolic panel checked by PCP as outpatient discussed lab results and consider resumption if stable -- came in with creatinine of 2.47--- 2.1 -- patient recommended to continue hydration   Chronic HFrEF --euvolemic -Short course IV hydration to help AKI -Hold off lisinopril and hydrochlorothiazide -Continue metoprolol   CAD with RCA stenting -Last stent was placed in more than 10 years ago, plan to continue aspirin and hold off Plavix.   History of frequent PVCs -On amiodarone   BPH -Continue Flomax and finasterid   DVT prophylaxis: SCD Code Status: Full code     Pain control - [place] Controlled Substance Reporting System database was reviewed. and patient was instructed, not to drive, operate heavy machinery, perform activities at heights, swimming or participation in water activities or provide baby-sitting services while on Pain, Sleep and Anxiety Medications; until their outpatient Physician has advised to do so again. Also recommended to not to take more than prescribed Pain, Sleep and Anxiety Medications.  Consultants: urology Dr. Lonna Cobb Procedures performed: as above Disposition: Home Diet recommendation:  Discharge Diet Orders (From admission, onward)     Start     Ordered   12/20/22 0000  Diet - low sodium heart healthy  12/20/22 1413           Cardiac diet DISCHARGE MEDICATION: Allergies as of 12/20/2022        Reactions   Cephalosporins Diarrhea   Escitalopram Oxalate Other (See Comments)   lethargic        Medication List     STOP taking these medications    hydrochlorothiazide 12.5 MG tablet Commonly known as: HYDRODIURIL   lisinopril 10 MG tablet Commonly known as: ZESTRIL   ondansetron 4 MG disintegrating tablet Commonly known as: ZOFRAN-ODT   oxyCODONE-acetaminophen 5-325 MG tablet Commonly known as: Percocet       TAKE these medications    ALPRAZolam 0.5 MG tablet Commonly known as: XANAX Take 0.5 mg by mouth 2 (two) times daily as needed for sleep or anxiety.   amiodarone 200 MG tablet Commonly known as: PACERONE Take 200 mg by mouth at bedtime.   aspirin EC 81 MG tablet Take 81 mg by mouth at bedtime.   clopidogrel 75 MG tablet Commonly known as: PLAVIX Take 75 mg by mouth at bedtime.   docusate sodium 100 MG capsule Commonly known as: COLACE Take 100 mg by mouth 2 (two) times daily as needed for mild constipation or moderate constipation.   finasteride 5 MG tablet Commonly known as: PROSCAR TAKE 1 TABLET BY MOUTH EVERY DAY   HYDROcodone-acetaminophen 5-325 MG tablet Commonly known as: NORCO/VICODIN Take 1 tablet by mouth every 6 (six) hours as needed for moderate pain.   metoprolol succinate 50 MG 24 hr tablet Commonly known as: TOPROL-XL Take 50 mg by mouth every evening. Take with or immediately following a meal.   oxybutynin 5 MG tablet Commonly known as: DITROPAN Take 1 tablet (5 mg total) by mouth every 8 (eight) hours as needed for bladder spasms.   rosuvastatin 40 MG tablet Commonly known as: CRESTOR Take 40 mg by mouth at bedtime.   tamsulosin 0.4 MG Caps capsule Commonly known as: FLOMAX Take 2 capsules (0.8 mg total) by mouth at bedtime.   Vitamin D3 50 MCG (2000 UT) Tabs Take 2,000 Units by mouth daily.        Follow-up Information     Curtis Sites III, MD. Schedule an appointment as soon as possible for a visit in 1  week(s).   Specialty: Internal Medicine Why: Hospital follow-up and lab work [facility]) Contact information: [address] [place] [address] [phone]         [name], MD. Schedule an appointment as soon as possible for a visit in 1 week(s).   Specialty: Urology Why: Hospital follow-up and stent removal Contact information: [address] [place] [address] [phone]                Discharge Exam: Ceasar Mons Weights   12/19/22 1147 12/19/22 1920  Weight: 101.6 kg 102 kg   Alert and oriented times three cardiovascular both heart sounds normal rate rhythm regular respiratory clear to auscultation abdomen soft benign no guarding rigidity tenderness neuro- grossly nonfocal.   Condition at discharge: fair  The results of significant diagnostics from this hospitalization (including imaging, microbiology, ancillary and laboratory) are listed below for reference.   Imaging Studies: DG OR UROLOGY CYSTO IMAGE ([facility] ONLY)  Result Date: 12/20/2022 There is no interpretation for this exam.  This order is for images obtained during a surgical procedure.  Please See "Surgeries" Tab for more information regarding the procedure.   DG Chest 1 View  Result Date:  12/20/2022 CLINICAL DATA:  CHF. EXAM: CHEST  1 VIEW COMPARISON:  03/07/2013 FINDINGS: Cardiopericardial silhouette is at upper limits of normal for size. Diffuse interstitial opacity bilaterally suggest pulmonary edema. No focal consolidation or pleural effusion. No acute bony abnormality. IMPRESSION: Diffuse interstitial opacity bilaterally suggests pulmonary edema. Electronically Signed   By: Kennith Center M.D.   On: 12/20/2022 07:20   CT Renal Stone Study  Result Date: 12/19/2022 CLINICAL DATA:  Left-sided flank pain EXAM: CT ABDOMEN AND PELVIS WITHOUT CONTRAST TECHNIQUE: Multidetector CT imaging of the abdomen and pelvis was performed following the standard protocol without IV  contrast. RADIATION DOSE REDUCTION: This exam was performed according to the departmental dose-optimization program which includes automated exposure control, adjustment of the mA and/or kV according to patient size and/or use of iterative reconstruction technique. COMPARISON:  CT 5 days ago. FINDINGS: Lower chest: Mild linear opacity lung bases likely scar or atelectasis. No pleural effusion. Small air cysts along both lung bases. Coronary artery calcifications are seen. Hepatobiliary: Gallbladder is nondilated. Small cysts seen in segment 5. No imaging follow-up. Pancreas: Unremarkable. No pancreatic ductal dilatation or surrounding inflammatory changes. Spleen: Normal in size without focal abnormality. Adrenals/Urinary Tract: Adrenal glands are preserved. Increasing left perinephric stranding. There is persistent parenchymal enhancement of the left kidney. Please correlate with last contrast administration. There are multiple intrarenal stones identified. Largest measures 10 mm and was seen previously. Multiple smaller stones towards the lower pole. There is also dilatation of the collecting system down to the level of the distal ureter where there is a stone in the distal left ureter measuring on coronal series 5, image 85 5 mm. There is a punctate stone just proximal to this has well. The collecting system dilatation is increased from previous. Previously the stone was in the very proximal ureter. There are multiple dependent stones in the urinary bladder. Up to 5. The number of bladder stones appears similar. Staghorn right-sided renal stones are seen, similar to previous. No right-sided collecting system dilatation. No right ureteral stone. Bosniak 1 anterior renal cyst again identified. No imaging follow-up. Bladder is nondilated. Stomach/Bowel: On this non oral contrast exam, the stomach is distended with the air. The small bowel is nondilated. Large bowel is nondilated. There is a redundant course of the  sigmoid colon in the upper abdomen on the right side. Surgical changes identified along the colon. Presumed subtotal left hemicolectomy. Vascular/Lymphatic: Scattered vascular calcifications along the aorta and branch vessels. Mild dilatation of the abdominal aorta measuring 3 cm. Normal caliber IVC. No specific abnormal lymph node enlargement seen in the abdomen and pelvis. Circumaortic left renal vein. Reproductive: Prostate measures 6.9 x 5.4 x 5.7 cm. Other: No free air or free fluid. Musculoskeletal: Degenerative changes of the spine and pelvis. IMPRESSION: Previous left UPJ stone measuring 5 mm is now along the distal left ureter with second more proximal smaller stone. Increasing left-sided renal collecting system dilatation. There also persistent enhancement of the left kidney. Please correlate for time of most recent contrast injection. Increasing perinephric fluid and stranding on the left. Additional nonobstructing left-sided renal stones. Staghorn calculi on the right. Persistent bladder stones. Enlarged prostate.  Please correlate with patient's PSA. 3 cm abdominal aortic aneurysm. Recommend follow-up ultrasound every 3 years. This recommendation follows ACR consensus guidelines: White Paper of the ACR Incidental Findings Committee II on Vascular Findings. J Am Coll Radiol 2013; 10:789-794. Surgical changes along the colon. Coronary artery calcifications. Please correlate for other coronary risk factors Electronically Signed  By: Karen Kays M.D.   On: 12/19/2022 13:44   US THYROID  Result Date: 12/17/2022 CLINICAL DATA:  67 year old male with thyroid nodule EXAM: THYROID ULTRASOUND TECHNIQUE: Ultrasound examination of the thyroid gland and adjacent soft tissues was performed. COMPARISON:  None Available. FINDINGS: Parenchymal Echotexture: Mildly heterogenous Isthmus: 1.0 Right lobe: 5.8 cm x 1.9 cm x 2.4 cm Left lobe: 4.9 cm x 1.5 cm x 2.5 cm  _________________________________________________________ Estimated total number of nodules >/= 1 cm: 1 Number of spongiform nodules >/=  2 cm not described below (TR1): 0 Number of mixed cystic and solid nodules >/= 1.5 cm not described below (TR2): 0 _________________________________________________________ Nodule # 1: Location: Isthmus; left Maximum size: 2.4 cm; Other 2 dimensions: 2.1 cm x 1.2 cm Composition: solid/almost completely solid (2) Echogenicity: hypoechoic (2) Shape: not taller-than-wide (0) Margins: ill-defined (0) Echogenic foci: punctate echogenic foci (3) ACR TI-RADS total points: 7. ACR TI-RADS risk category: TR5 (>/= 7 points). ACR TI-RADS recommendations: Biopsy _________________________________________________________ Spongiform nodule in the right thyroid labeled 2 does not meet criteria for surveillance. No adenopathy IMPRESSION: Left isthmic thyroid nodule (labeled 1, TR 5) meets criteria for biopsy, as designated by the newly established ACR TI-RADS criteria, and referral for biopsy is recommended. Recommendations follow those established by the new ACR TI-RADS criteria (J Am Coll Radiol 2017;14:587-595). Electronically Signed   By: Gilmer Mor D.O.   On: 12/17/2022 13:53   CT ABDOMEN PELVIS W CONTRAST  Result Date: 12/14/2022 CLINICAL DATA:  Left lower quadrant abdominal pain, constipation EXAM: CT ABDOMEN AND PELVIS WITH CONTRAST TECHNIQUE: Multidetector CT imaging of the abdomen and pelvis was performed using the standard protocol following bolus administration of intravenous contrast. RADIATION DOSE REDUCTION: This exam was performed according to the departmental dose-optimization program which includes automated exposure control, adjustment of the mA and/or kV according to patient size and/or use of iterative reconstruction technique. CONTRAST:  OMNIPAQUE IOHEXOL 300 MG/ML  SOLN COMPARISON:  None Available. FINDINGS: Lower chest: No acute abnormality. Extensive right  coronary artery calcification. Hepatobiliary: No focal liver abnormality is seen. No gallstones, gallbladder wall thickening, or biliary dilatation. Pancreas: Unremarkable Spleen: Unremarkable Adrenals/Urinary Tract: The adrenal glands are unremarkable. The kidneys are normal in size and position. 5.7 cm simple cortical cyst noted within the anterior interpolar region of the right kidney for which no follow-up imaging is recommended. Multiple nonobstructing calculi are seen within the kidneys bilaterally with a staghorn calculus within the lower pole of the right kidney measuring up to 2.9 x 2.9 x 1.5 cm. There is mild left hydronephrosis and moderate perinephric edema and stranding secondary to an obstructing 3 x 5 mm calculus within the left ureteropelvic junction. No additional ureteral calculi. No hydronephrosis on the right. Multiple layering calculi are seen within the bladder lumen measuring up to 5 mm in size. The bladder is not distended. Stomach/Bowel: Surgical changes of left hemicolectomy are identified with a relatively high anastomosis identified within the right mid abdomen. The stomach, small bowel, and large bowel are otherwise unremarkable there is no evidence of obstruction or focal inflammation. Appendix normal. No free intraperitoneal gas or fluid Vascular/Lymphatic: 3.1 cm fusiform infrarenal abdominal aortic aneurysm is present. Superimposed extensive aortoiliac atherosclerotic calcification. No pathologic adenopathy within the abdomen and pelvis. Reproductive: Marked prostatic hypertrophy. Other: No abdominal wall hernia or abnormality. No abdominopelvic ascites. Musculoskeletal: No acute or significant osseous findings. IMPRESSION: 1. Obstructing 3 x 5 mm calculus within the left ureteropelvic junction resulting in mild left hydronephrosis and  moderate perinephric edema and stranding. 2. Multiple nonobstructing calculi within the kidneys bilaterally with a staghorn calculus within the lower  pole of the right kidney measuring up to 2.9 cm in size. 3. Multiple layering calculi within the bladder lumen measuring up to 5 mm in size. 4. 3.1 cm fusiform infrarenal abdominal aortic aneurysm. Recommend follow-up ultrasound every 3 years. 5. Extensive right coronary artery calcification. 6. Marked prostatic hypertrophy. Aortic Atherosclerosis (ICD10-I70.0). Electronically Signed   By: Helyn Numbers M.D.   On: 12/14/2022 18:37   CT CHEST LUNG CA SCREEN LOW DOSE W/O CM  Result Date: 12/01/2022 CLINICAL DATA:  Current smoker, 30 pack-year history. History of bladder cancer. EXAM: CT CHEST WITHOUT CONTRAST LOW-DOSE FOR LUNG CANCER SCREENING TECHNIQUE: Multidetector CT imaging of the chest was performed following the standard protocol without IV contrast. RADIATION DOSE REDUCTION: This exam was performed according to the departmental dose-optimization program which includes automated exposure control, adjustment of the mA and/or kV according to patient size and/or use of iterative reconstruction technique. COMPARISON:  06/05/2004. FINDINGS: Cardiovascular: Atherosclerotic calcification of the aorta and coronary arteries. Enlarged pulmonic trunk and heart. No pericardial effusion. Mediastinum/Nodes: 2.2 cm low-attenuation left thyroid nodule. No pathologically enlarged mediastinal or axillary lymph nodes. Hilar regions are difficult to definitively evaluate without IV contrast. Esophagus is grossly unremarkable. Lungs/Pleura: Bullous paraseptal and centrilobular emphysema. Smoking related respiratory bronchiolitis. No suspicious pulmonary nodules. Subpleural lymph node along the left major fissure. No pleural fluid. Debris is seen in the airway. Upper Abdomen: Visualized portions of the liver, gallbladder, adrenal glands and right kidney are unremarkable. Stones in the left kidney measure up to 11 mm. There may be a low-attenuation lesion in the left kidney. No specific follow-up necessary. Visualized portions  of the spleen, pancreas, stomach and bowel are grossly unremarkable. No upper abdominal adenopathy. Musculoskeletal: Degenerative changes in the spine. IMPRESSION: 1. Lung-RADS 2, benign appearance or behavior. Continue annual screening with low-dose chest CT without contrast in 12 months. 2. Left renal stones. 3. 2.2 cm low-attenuation left thyroid nodule. Recommend thyroid ultrasound. (Ref: J Am Coll Radiol. 2015 Feb;12(2): 143-50). 4. Aortic atherosclerosis (ICD10-I70.0). Coronary artery calcification. 5. Enlarged pulmonic trunk, indicative of pulmonary arterial hypertension. 6.  Emphysema (ICD10-J43.9). Electronically Signed   By: Leanna Battles M.D.   On: 12/01/2022 14:33    Microbiology: Results for orders placed or performed during the hospital encounter of 12/19/22  Surgical PCR screen     Status: None   Collection Time: 12/20/22 12:44 AM   Specimen: Nasal Mucosa; Nasal Swab  Result Value Ref Range Status   MRSA, PCR NEGATIVE NEGATIVE Final   Staphylococcus aureus NEGATIVE NEGATIVE Final    Comment: (NOTE) The Xpert SA Assay (FDA approved for NASAL specimens in patients 83 years of age and older), is one component of a comprehensive surveillance program. It is not intended to diagnose infection nor to guide or monitor treatment. Performed at [facility], [address] [place], [place] [address]     Labs: CBC: Recent Labs  Lab 12/14/22 1404 12/19/22 1103 12/20/22 0420  WBC 10.1 9.2 7.7  NEUTROABS 8.4*  --   --   HGB 16.8 15.5 14.1  HCT 50.2 46.0 42.9  MCV 90.5 91.3 91.5  PLT 168 175 166   Basic Metabolic Panel: Recent Labs  Lab 12/14/22 1404 12/19/22 1103 12/20/22 0420  NA 138 138 139  K 3.7 3.6 3.6  CL 102 99 102  CO2 24 27 26   GLUCOSE 120* 104* 101*  BUN 20 38* 36*  CREATININE 1.30* 2.27* 2.11*  CALCIUM 9.6 9.8 8.9   Liver Function Tests: Recent Labs  Lab 12/14/22 1404  AST 25  ALT 21  ALKPHOS 64  BILITOT 0.7  PROT 8.0  ALBUMIN 4.4     Discharge time spent: greater than 30 minutes.  Signed: Enedina Finner, MD [facility] 12/20/2022

## 2022-12-20 NOTE — Care Management CC44 (Signed)
Condition Code 44 Documentation Completed  Patient Details  Name: LILIANA BIBY MRN: [id number] Date of Birth: 10-07-1955   Condition Code 44 given:  Yes Patient signature on Condition Code 44 notice:  Yes Documentation of 2 MD's agreement:  Yes Code 44 added to claim:  Yes    Addis Bennie E Xia Stohr, LCSW 12/20/2022, 2:37 PM

## 2022-12-20 NOTE — Transfer of Care (Signed)
Immediate Anesthesia Transfer of Care Note  Patient: Jeffrey Buchanan  Procedure(s) Performed: CYSTOSCOPY/URETEROSCOPY/HOLMIUM LASER/STENT PLACEMENT (Left) CYSTOSCOPY WITH LITHOLAPAXY for Bladder Stone  Patient Location: PACU  Anesthesia Type:General  Level of Consciousness: awake, alert , and oriented  Airway & Oxygen Therapy: Patient Spontanous Breathing and Patient connected to face mask oxygen  Post-op Assessment: Report given to RN, Post -op Vital signs reviewed and stable, and Patient moving all extremities  Post vital signs: Reviewed and stable  Last Vitals:  Vitals Value Taken Time  BP    Temp    Pulse    Resp    SpO2      Last Pain:  Vitals:   12/20/22 0848  TempSrc:   PainSc: 2          Complications: No notable events documented.

## 2022-12-20 NOTE — Anesthesia Preprocedure Evaluation (Signed)
Anesthesia Evaluation  Patient identified by MRN, date of birth, ID band Patient awake    Reviewed: Allergy & Precautions, NPO status , Patient's Chart, lab work & pertinent test results  History of Anesthesia Complications Negative for: history of anesthetic complications  Airway Mallampati: III  TM Distance: >3 FB Neck ROM: full    Dental  (+) Chipped, Poor Dentition, Missing, Dental Advidsory Given   Pulmonary Current Smoker and Patient abstained from smoking.   Pulmonary exam normal        Cardiovascular Exercise Tolerance: Good hypertension, + CAD, + Past MI, + Cardiac Stents and +CHF  Normal cardiovascular exam     Neuro/Psych  Headaches PSYCHIATRIC DISORDERS         GI/Hepatic Neg liver ROS,GERD  Medicated and Controlled,,  Endo/Other  negative endocrine ROS    Renal/GU Renal disease     Musculoskeletal   Abdominal   Peds  Hematology negative hematology ROS (+)   Anesthesia Other Findings Past Medical History: 04/06/2003: Acute MI, inferior wall (HCC)     Comment:  a.) PCI 04/10/2003 --> 90% pRCA --> 3.5 x 18 mm and 4.0               x 23 mm Vision stents to RCA. No date: Anxiety No date: Arthritis No date: BPH (benign prostatic hyperplasia) No date: Chronic anticoagulation     Comment:  a.) DAPT therapy (ASA + clopidogrel) No date: Chronic back pain 04/10/2003: Coronary artery disease     Comment:  a.) LHC 04/10/2003 --> EF 40%; 90% pRCA, 40% x 2 mRCA,               30% and 20% pLCx; 3.5 x 18 mm and 4.0 x 23 mm Vision               stents to RCA. 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 No date: Vitamin D deficiency  Past Surgical History: 03/04/2017: COLONOSCOPY WITH PROPOFOL; N/A     Comment:  Procedure: COLONOSCOPY WITH PROPOFOL;  Surgeon: [place],               Boykin Nearing, MD;  Location: [facility];  Service:               Gastroenterology;  Laterality: N/A; 04/10/2003: CORONARY ANGIOPLASTY WITH STENT PLACEMENT; Left     Comment:  Procedure: CORONARY ANGIOPLASTY WITH STENT PLACEMENT               (3.5 x 18 mm and 4.0 x 23 mm Vision stents to RCA);               Location: [facility]; Surgeon: Arnoldo Hooker, MD 09/26/2013: Bluford Kaufmann W/ RETROGRADES; Bilateral     Comment:  Procedure: CYSTOSCOPY WITH BILATERAL RETROGRADE               PYELOGRAM;  Surgeon: Magdalene Molly, MD;  Location:               [facility];  Service: Urology;                Laterality: Bilateral; 06/13/2019: CYSTOSCOPY WITH LITHOLAPAXY; N/A     Comment:  Procedure: CYSTOSCOPY;  Surgeon: Vanna Scotland, MD;                Location: [facility];  Service: Urology;  Laterality: N/A; 03/04/2017: ESOPHAGOGASTRODUODENOSCOPY (EGD) WITH PROPOFOL; N/A     Comment:  Procedure: ESOPHAGOGASTRODUODENOSCOPY (EGD) WITH               PROPOFOL;  Surgeon: Toledo, Boykin Nearing, MD;  Location:               [facility];  Service: Gastroenterology;  Laterality:               N/A; 04/07/1974: OPEN REDUCTION SHOULDER DISLOCATION 04/07/2004: PARTIAL COLECTOMY 06/13/2019: STONE EXTRACTION WITH BASKET; N/A     Comment:  Procedure: STONE EXTRACTION WITH BASKET;  Surgeon:               Vanna Scotland, MD;  Location: [facility];  Service:               Urology;  Laterality: N/A; 2014: TRANSURETHRAL RESECTION OF BLADDER TUMOR; N/A 09/26/2013: TRANSURETHRAL RESECTION OF BLADDER TUMOR WITH GYRUS  (TURBT-GYRUS); N/A     Comment:  Procedure: BLADDER BIOPSY;  Surgeon: Magdalene Molly,               MD;  Location: [facility];  Service:               Urology;  Laterality: N/A;  BMI    Body Mass Index: 32.10 kg/m      Reproductive/Obstetrics negative OB ROS                             Anesthesia Physical Anesthesia Plan  ASA: 3  Anesthesia Plan: General LMA and General   Post-op Pain Management:    Induction: Intravenous  PONV Risk Score and Plan: Dexamethasone, Ondansetron, Midazolam and Treatment may vary due to age or medical condition  Airway Management Planned: LMA  Additional Equipment:   Intra-op Plan:   Post-operative Plan: Extubation in OR  Informed Consent: I have reviewed the patients History and Physical, chart, labs and discussed the procedure including the risks, benefits and alternatives for the proposed anesthesia with the patient or authorized representative who has indicated his/her understanding and acceptance.     Dental Advisory Given  Plan Discussed with: Anesthesiologist, CRNA and Surgeon  Anesthesia Plan Comments: (Patient consented for risks of anesthesia including but not limited to:  - adverse reactions to medications - damage to eyes, teeth, lips or other oral mucosa - nerve damage due to positioning  - sore throat or hoarseness - Damage to heart, brain, nerves, lungs, other parts of body or loss of life  Patient voiced understanding.)        Anesthesia Quick Evaluation

## 2022-12-21 ENCOUNTER — Encounter: Payer: Self-pay | Admitting: Urology

## 2022-12-23 ENCOUNTER — Encounter: Payer: Self-pay | Admitting: Licensed Clinical Social Worker

## 2022-12-23 ENCOUNTER — Inpatient Hospital Stay (HOSPITAL_BASED_OUTPATIENT_CLINIC_OR_DEPARTMENT_OTHER): Payer: PPO | Admitting: Licensed Clinical Social Worker

## 2022-12-23 DIAGNOSIS — Z1379 Encounter for other screening for genetic and chromosomal anomalies: Secondary | ICD-10-CM | POA: Diagnosis not present

## 2022-12-23 DIAGNOSIS — Z8551 Personal history of malignant neoplasm of bladder: Secondary | ICD-10-CM

## 2022-12-23 DIAGNOSIS — Z86010 Personal history of colonic polyps: Secondary | ICD-10-CM

## 2022-12-23 DIAGNOSIS — Z1509 Genetic susceptibility to other malignant neoplasm: Secondary | ICD-10-CM | POA: Diagnosis not present

## 2022-12-23 DIAGNOSIS — Z8052 Family history of malignant neoplasm of bladder: Secondary | ICD-10-CM | POA: Diagnosis not present

## 2022-12-23 DIAGNOSIS — Z8042 Family history of malignant neoplasm of prostate: Secondary | ICD-10-CM | POA: Diagnosis not present

## 2022-12-23 DIAGNOSIS — Z809 Family history of malignant neoplasm, unspecified: Secondary | ICD-10-CM | POA: Diagnosis not present

## 2022-12-23 NOTE — Progress Notes (Signed)
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 Press was previously seen in the [facility] due to a personal and family history of cancer and concerns regarding a hereditary predisposition to cancer. Please refer to our prior cancer genetics clinic note for more information regarding our discussion, assessment and recommendations, at the time. Mr. Stene's recent genetic test results were disclosed to him, as were recommendations warranted by these results. These results and recommendations are discussed in more detail below.  CANCER HISTORY:  In 2014, Mr. House was diagnosed with urothelial carcinoma of the bladder. This was treated with surgery. He also reports history of colon cancer found in a colon polyp for which he had partial colectomy. In 2024, Mr. Schetter was found to have a sebaceous adenoma.   FAMILY HISTORY:  We obtained a detailed, 4-generation family history.  Significant diagnoses are listed below: Family History  Problem Relation Age of Onset   Bladder Cancer Mother        dx 79s   Prostate cancer Brother        dx 6s   Cancer Maternal Grandmother        unk type   Kidney disease Neg Hx    Mr. Holthusen has 1 daughter, 52. No cancers. He has 3 brothers, one had prostate cancer in his 3s.   Mr. Liaw's mother had bladder cancer in her 60s and died at 46 of metastatic disease. Maternal grandmother also had cancer, unknown type. Maternal grandfather is of Jewish descent, little information about him.    Mr. Noone's father died at 2. No known cancers on this side of the family.   Mr. Kuyper is unaware of previous family history of genetic testing for hereditary cancer risks. There is no reported Ashkenazi Jewish ancestry. There is no known consanguinity.    GENETIC TESTING: Mr. 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 Specifically, this variant is [phone]  ([id number]).  This result information was provided to Mr. Noreene Filbert.  A copy of this result is scanned under the Epic lab tab.      Clinical Information:    Pathogenic variants in the MLH1 gene are associated with Lynch syndrome.   The cancers associated with MLH1 are: Colorectal cancer, 46-61% risk (average age of diagnosis is 8) Endometrial cancer, 34-54% risk (average age of diagnosis is 60) Ovarian cancer, 4-20% risk (average age of diagnosis is 12) Renal pelvis and/or ureter, up to 5% risk (average age of diagnosis is 68-60) Bladder cancer, up to a 7% risk (average age of diagnosis is 49) Gastric cancer, up to a 7% risk (average age of diagnosis is 58) Small bowel cancer, up to a 11% risk (average age of diagnosis is 65) Biliary tract cancer, up to 3.7% risk (average age of diagnosis is 35) Pancreatic cancer, 6.2% risk (average age of diagnosis is not known) Breast, prostate, and brain cancer risk may be elevated   Management Recommendations:   Colorectal Cancer Screening: High quality colonoscopy at age 79-25 or 2-5 years prior to the earliest colon cancer if it is diagnosed before age 70 and repeat every 1-2 years   Endometrial Cancer Screening/Risk Reduction: Women should report any abnormal uterine bleeding or postmenopausal bleeding. The evaluation of these symptoms should include an endometrial biopsy.  A hysterectomy may be considered. The timing should be individualized based on whether childbearing is complete, comorbidities, and family history. Endometrial cancer screening does not have a  proven benefit in women with Lynch Syndrome. However, endometrial biopsy is highly sensitive and specific as a diagnostic procedure. Screening via endometrial biopsy every 1-2 years starting at age 75-35 can be considered.  Transvaginal ultrasounds may be considered in postmenopausal women at their clinician's discretion.    Ovarian Cancer Screening/Risk Reduction: A  prophylactic bilateral salpingo-oophorectomy (BSO), or having the ovaries and fallopian tubes removed, may be considered. A BSO is estimated to reduce the risk of ovarian cancer by up to 96%. Timing of a BSO should be individualized based on whether childbearing is complete, menopause status, comorbidities, and family history. Women should be aware of symptoms that might be associated with the development of ovarian cancer including pelvic or abdominal pain, bloating, increased abdominal girth, difficulty eating, early satiety, or urinary frequency or urgency. Symptoms that persist for several weeks and are a change from a woman's baseline should prompt evaluation by her physician.  Current data does not support routine ovarian screening for Lynch syndrome, therefore it may be considered at the clinician's discretion. Screening includes transvaginal ultrasounds and a blood test to measure CA-125 levels.   Urothelial Cancer Screening: Annual urinalysis starting at age 14-35 may be considered in selected individuals such as those with a family history of urothelial cancer (renal pelvis, ureter, and/or bladder).   Gastric and Small Bowel Cancer Screening: Upper GI surveillance with esophagogastroduodenoscopy (EGD) starting at age 26-40 and repeat every 2-4 years, preferably performed in conjunction with colonoscopy. Random biopsy of the proximal and distal stomach should at minimum be performed on the initial procedure to assess for H. pylori, autoimmune gastritis, and intestinal metaplasia.  Age of initiation prior to 30 years and/or surveillance interval less than 2 years may be considered based on family history of upper GI cancers or high-risk endoscopic findings.  Individuals not undergoing upper endoscopic surveillance should have one-time noninvasive testing for H. pylori at the time of Lynch Syndrome diagnosis, with treatment indicated if H. pylori is detected.    Pancreatic Cancer Screening: Avoid  smoking, heavy alcohol use, and obesity. It has been suggested that pancreatic cancer screening be limited to those with a family history of pancreatic cancer (first- or second-degree relative). Ideally, screening should be performed in experienced centers utilizing a multidisciplinary approach under research conditions. Recommended screening include annual endoscopic ultrasound (preferred) and/or MRI of the pancreas starting at age 40 or 66 years younger than the earliest age diagnosis in the family.    Prostate Cancer Screening: Consider annual PSA blood test and annual digital rectal exam (DRE) at age 74   Brain Cancer Screening: Patients should be educated regarding signs and symptoms of neurologic cancer and the importance of prompt reporting of abnormal symptoms to their physicians.   Skin Screening: Frequency of malignant and benign skin tumors such as sebaceous adenocarcinomas, sebaceous adenomas, and keratoacanthomas has been reported to be increased among patients with Lynch Syndrome, but cumulative lifetime risk and median age of presentation are uncertain. Consider skin exam every 1-2 years with a health care provider skilled in identifying Lynch Syndrome-associated skin manifestations.    Additional Considerations: Individuals with a single pathogenic MLH1 variant are carriers of constitutional MMR deficiency (CMMRD) syndrome. CMMRD is a childhood-onset cancer predisposition syndrome that can present with hematological malignancies, cancers of the brain and central nervous system, Lynch syndrome-associated cancers (colon, uterine, small bowel, urinary tract), embryonic tumors, and sarcomas. Some affected individuals may also display cafe-au-lait macules. For there to be a risk of CMMRD in offspring, both the  patient and their partner would each have to carry a pathogenic variant in MLH1. In this case, the risk of having an affected child is 25%.  Patients of reproductive age should be  made aware of options for prenatal diagnosis and assisted reproduction including pre-implantation genetic diagnosis.   This information is based on current understanding of the gene and may change in the future. (NCCN v. 1.2024)   Implications for Family Members: Hereditary predisposition to cancer due to pathogenic variants in the MLH1 gene has autosomal dominant inheritance. This means that an individual with a pathogenic variant has a 50% chance of passing the condition on to his/her offspring. Identification of a pathogenic variant allows for the recognition of at-risk relatives who can pursue testing for the familial variant.   Family members are encouraged to consider genetic testing for this familial pathogenic variant. As there are generally no childhood cancer risks associated with a single pathogenic variant in the MLH1 gene, individuals in the family are not recommended to have testing until they reach at least 67 years of age. They may contact our office at [phone] for more information or to schedule an appointment. Complimentary testing for the familial variant is available for 150 days after the genetic testing report date. Family members who live outside of the area are encouraged to find a genetic counselor in their area by visiting: [email or web address].   Resources: [employer]CE (Facing Our Risk of Cancer Empowered) is a resource for those with a hereditary predisposition to develop cancer.  [employer] provides information about risk reduction, advocacy, legislation, and clinical trials.  Additionally, [employer] provides a platform for collaboration and support which includes: peer navigation, message boards, local support groups, a toll-free helpline, research registry and recruitment, advocate training, published medical research, webinars, brochures, mastectomy photos, and more.  For more information, visit www.facingourrisk.org   Hereditary Colon Cancer Foundation-  www.hcctakesguts.org   Lynch [employer]- www.lynchcancers.com   [employer]- www.kintalk.org  PLAN:   Mr. Lister will need to be followed as high risk based on his diagnosis of Lynch syndrome.    Mr. Kava has identified a gastroenterologist that he plans to see for follow up for his diagnosis of Lynch syndrome.  This individual is Dr. Norma Fredrickson.  These results will also be disclosed to his PCP, Dr. Daniel Nones, his urologist, Dr. Vanna Scotland, and his referring provider, Dr. Willeen Niece.   Mr. Hornsby plans to discuss these results with family and will reach out to [place] if we can be of any assistance in coordinating genetic testing for any of relatives.    We encouraged Mr. Tubb to remain in contact with [place] on an annual basis so we can update his personal and family histories, and let him know of advances in cancer genetics that may benefit the family. Our contact number was provided. Mr. Holmer's questions were answered to his satisfaction today, and he knows he is welcome to call anytime with additional questions.   Lacy Duverney, MS, [facility] Genetic Counselor [email or web address] Phone: [phone]

## 2022-12-24 NOTE — Progress Notes (Unsigned)
Cardiology Office Note Date:  12/25/2022  Patient ID:  Jeffrey Buchanan, Jeffrey Buchanan June 14, 1955, MRN [id number] PCP:  Lynnea Ferrier, MD  Cardiologist:  Marcina Millard, MD Electrophysiologist: Lanier Prude, MD   Chief Complaint: PVC, discuss CRT-D  History of Present Illness: Jeffrey Buchanan is a 67 y.o. male with PMH notable for HFrEF, LBBB, CAD s/p PCI, PVCs, HTN, HLD; seen today for Lanier Prude, MD for routine electrophysiology followup.  He last saw Dr. Lalla Brothers 09/2022. Previously PVC burden 40% > reduced to 17% on amiodarone. Continued to have LVEF reduction, cMRI showed large scar burden. ICD recommended, pt requested more time to think about options. EKG at visit did not have PVCs, Pt was to obtain zio to further eval PVC burden.  On follow-up today, he continues to feel well. No palpitations. Continues to have good exercise tolerance. Able to walk up 4 flights of stairs to his apartment - does get a little SOB if he is carrying groceries, but recovers quickly. Denies chest pain, chest pressure. No lower extremity edema. No syncope or presyncope.  He continues to take amiodarone 200mg  daily, no sun sensitivity or GI upset.  He was recently diagnosed with a kidney stone, has a stent in place to be removed next week by urology.  He is also planning a thyroid biopsy for a nodule.  Is concerned about long-term effects of amiodarone. He has many questions about short term and long-term restrictions, effects of CRT-D   Device Information: none  AAD History: Amiodarone   Past Medical History:  Diagnosis Date   Acute MI, inferior wall (HCC) 04/06/2003   a.) PCI 04/10/2003 --> 90% pRCA --> 3.5 x 18 mm and 4.0 x 23 mm Vision stents to RCA.   Anxiety    Arthritis    BPH (benign prostatic hyperplasia)    Chronic anticoagulation    a.) DAPT therapy (ASA + clopidogrel)   Chronic back pain    Coronary artery disease 04/10/2003   a.) LHC 04/10/2003 --> EF 40%; 90% pRCA,  40% x 2 mRCA, 30% and 20% pLCx; 3.5 x 18 mm and 4.0 x 23 mm Vision stents to RCA.   DJD (degenerative joint disease)    GERD (gastroesophageal reflux disease)    Headache    History of adenomatous polyp of colon    S/P  PARTIAL COLECTOMY 2006   History of kidney stones    HLD (hyperlipidemia)    Hyperlipidemia    Hypertension    Skin cancer    Urothelial carcinoma of bladder (HCC) 09/14/2012   a.) Bx 09/14/2012 --> pathology (+) for low grade papillary urothelial carcinoma.   Vitamin D deficiency     Past Surgical History:  Procedure Laterality Date   COLONOSCOPY WITH PROPOFOL N/A 03/04/2017   Procedure: COLONOSCOPY WITH PROPOFOL;  Surgeon: Toledo, Boykin Nearing, MD;  Location: [facility];  Service: Gastroenterology;  Laterality: N/A;   CORONARY ANGIOPLASTY WITH STENT PLACEMENT Left 04/10/2003   Procedure: CORONARY ANGIOPLASTY WITH STENT PLACEMENT (3.5 x 18 mm and 4.0 x 23 mm Vision stents to RCA); Location: [facility]; Surgeon: Arnoldo Hooker, MD   CYSTOSCOPY W/ RETROGRADES Bilateral 09/26/2013   Procedure: CYSTOSCOPY WITH BILATERAL RETROGRADE PYELOGRAM;  Surgeon: Magdalene Molly, MD;  Location: [facility];  Service: Urology;  Laterality: Bilateral;   CYSTOSCOPY WITH LITHOLAPAXY N/A 06/13/2019   Procedure: CYSTOSCOPY;  Surgeon: Vanna Scotland, MD;  Location: [facility];  Service: Urology;  Laterality: N/A;   CYSTOSCOPY WITH LITHOLAPAXY  N/A 01/28/2021   Procedure: CYSTOSCOPY WITH LITHOLAPAXY;  Surgeon: Vanna Scotland, MD;  Location: [facility];  Service: Urology;  Laterality: N/A;   CYSTOSCOPY WITH LITHOLAPAXY N/A 12/20/2022   Procedure: CYSTOSCOPY WITH LITHOLAPAXY for Bladder Larina Bras;  Surgeon: [name], MD;  Location: [facility];  Service: Urology;  Laterality: N/A;   CYSTOSCOPY/URETEROSCOPY/HOLMIUM LASER/STENT PLACEMENT Left 12/20/2022   Procedure: CYSTOSCOPY/URETEROSCOPY/HOLMIUM LASER/STENT PLACEMENT;  Surgeon: [name], MD;  Location: [facility];  Service:  Urology;  Laterality: Left;   ESOPHAGOGASTRODUODENOSCOPY (EGD) WITH PROPOFOL N/A 03/04/2017   Procedure: ESOPHAGOGASTRODUODENOSCOPY (EGD) WITH PROPOFOL;  Surgeon: Toledo, Boykin Nearing, MD;  Location: [facility];  Service: Gastroenterology;  Laterality: N/A;   OPEN REDUCTION SHOULDER DISLOCATION  04/07/1974   PARTIAL COLECTOMY  04/07/2004   STONE EXTRACTION WITH BASKET N/A 06/13/2019   Procedure: STONE EXTRACTION WITH BASKET;  Surgeon: Vanna Scotland, MD;  Location: [facility];  Service: Urology;  Laterality: N/A;   TRANSURETHRAL RESECTION OF BLADDER TUMOR N/A 2014   TRANSURETHRAL RESECTION OF BLADDER TUMOR WITH GYRUS (TURBT-GYRUS) N/A 09/26/2013   Procedure: BLADDER BIOPSY;  Surgeon: Magdalene Molly, MD;  Location: [facility];  Service: Urology;  Laterality: N/A;    Current Outpatient Medications  Medication Instructions   ALPRAZolam (XANAX) 0.5 mg, Oral, 2 times daily PRN   amiodarone (PACERONE) 200 mg, Oral, Daily at bedtime   aspirin EC 81 mg, Oral, Daily at bedtime   clopidogrel (PLAVIX) 75 mg, Oral, Daily at bedtime   docusate sodium (COLACE) 100 mg, Oral, 2 times daily PRN   finasteride (PROSCAR) 5 MG tablet TAKE 1 TABLET BY MOUTH EVERY DAY   HYDROcodone-acetaminophen (NORCO/VICODIN) 5-325 MG tablet 1 tablet, Oral, Every 6 hours PRN   metoprolol succinate (TOPROL-XL) 50 mg, Oral, Every evening, Take with or immediately following a meal.    oxybutynin (DITROPAN) 5 mg, Oral, Every 8 hours PRN   rosuvastatin (CRESTOR) 40 mg, Oral, Daily at bedtime   tamsulosin (FLOMAX) 0.8 mg, Oral, Daily at bedtime   Vitamin D3 2,000 Units, Oral, Daily    Social History:  The patient  reports that he has been smoking cigarettes. He has a 32 pack-year smoking history. He has never used smokeless tobacco. He reports that he does not drink alcohol and does not use drugs.   Family History:   The patient's family history includes Bladder Cancer in his mother; Cancer in his maternal  grandmother; Prostate cancer in his brother.  ROS:  Please see the history of present illness. All other systems are reviewed and otherwise negative.   PHYSICAL EXAM:  VS:  BP (!) 148/68 (BP Location: Left Arm, Patient Position: Sitting, Cuff Size: Normal)   Pulse (!) 54   Ht 6\' 2"  (1.88 m)   Wt 226 lb 3.2 oz (102.6 kg)   SpO2 98%   BMI 29.04 kg/m  BMI: Body mass index is 29.04 kg/m.  GEN- The patient is well appearing, alert and oriented x 3 today.   Lungs- Clear to ausculation bilaterally, normal work of breathing.  Heart- Regular rate and rhythm, no murmurs, rubs or gallops Extremities- No peripheral edema, warm, dry   EKG is ordered. Personal review of EKG from today shows:    EKG Interpretation Date/Time:  Thursday December 25 2022 09:33:26 EDT Ventricular Rate:  54 PR Interval:  174 QRS Duration:  170 QT Interval:  498 QTC Calculation: 472 R Axis:   -4  Text Interpretation: Sinus bradycardia Left bundle branch block Confirmed by Sherie Don [phone])  on 12/25/2022 9:34:39 AM    09/24/2022 - NSR, rate 62; LBBB, probable LVH  Recent Labs: 12/14/2022: ALT 21 12/20/2022: BUN 36; Creatinine, Ser 2.11; Hemoglobin 14.1; Platelets 166; Potassium 3.6; Sodium 139  No results found for requested labs within last 365 days.   Estimated Creatinine Clearance: 44 mL/min (A) (by C-G formula based on SCr of 2.11 mg/dL (H)).   Wt Readings from Last 3 Encounters:  12/25/22 226 lb 3.2 oz (102.6 kg)  12/19/22 224 lb 13.9 oz (102 kg)  12/14/22 224 lb (101.6 kg)     Additional studies reviewed include: Previous EP, cardiology notes.   cMRI, 09/03/2022 1. Moderate to severely reduced LV systolic function (LVEF = 33%).  2. Subendocardial scar in the basal-mid inferoseptal and inferior LV walls.  3. Scar/LGE comprises 16.5g (about 9%) of total myocardial mass.  4. Global hypokinesis with severe hypokinesis of the basal-mid inferoseptal and inferior wall.  5. Normal RV size and  function.  6. Findings suggest prior basal-mid inferoseptal and inferior LV infarct (RCA territory).  TTE, 06/18/2022 (CE) SEVERE LV SYSTOLIC DYSFUNCTION WITH AN ESTIMATED EF = 25-30 %  NORMAL RIGHT VENTRICULAR SYSTOLIC FUNCTION  MODERATE VALVULAR REGURGITATION (See above)  NO VALVULAR STENOSIS  GLS: -13.2 %   NM myocardial perfusion w spect, 03/05/2022 (CE) Indeterminant treadmill EKG due to baseline EKG changes  Moderate global LV systolic dysfunction with dilation of the left  ventricle  Minimal fixed inferior myocardial perfusion defect consistent with  previous infarct scar without evidence of myocardial ischemia   TTE, 02/04/2022 (CE) SEVERE LV SYSTOLIC DYSFUNCTION (See above) [25% estimate] NORMAL RIGHT VENTRICULAR SYSTOLIC FUNCTION  MODERATE VALVULAR REGURGITATION (See above)  NO VALVULAR STENOSIS    ASSESSMENT AND PLAN:  #) PVC Burden appears significantly reduced, previously 40% 1 week zio to further eval Continue amiodarone 200mg  daily TSH 11/2022 via CE stable at 1.041 Recent LFTs stable  #) HFrEF #) LBBB NYHA I symptoms Patient meets criteria for ICD for prevention of sudden cardiac death, as previously recommended by Dr. Wendelyn Breslow discussion regarding rational for device placement, peri-procedural and post-procedural expectations. Patient had multiple questions. Risks/benefits discussed. Patient remains undecided about whether to proceed.  Anticipate CRT-D with QRS GDMT - toprol 50mg  daily Holding lisinopril and hydrochlorothiazide   #) AKI #) HTN #) kidney stone Recently diagnosed with kidney stone and AKI, s/p stone treatment with Urology Holding lisinopril and HCTA d/t AKI with plans to follow-up in PCP office soon Slightly hypertensive in office today Would recommended restarting prior lisinopril and hydrochlorothiazide regimen when Cr allows.        Current medicines are reviewed at length with the patient today.   The patient has  concerns regarding his medicines.  The following changes were made today:  none  Labs/ tests ordered today include:  Orders Placed This Encounter  Procedures   LONG TERM MONITOR (3-14 DAYS)   EKG 12-Lead     Disposition: Follow up with Dr. Lalla Brothers  in 3 months   Signed, Sherie Don, NP  12/25/22  11:41 AM  Electrophysiology [facility]

## 2022-12-25 ENCOUNTER — Encounter: Payer: Self-pay | Admitting: Cardiology

## 2022-12-25 ENCOUNTER — Ambulatory Visit: Payer: PPO | Attending: Cardiology | Admitting: Cardiology

## 2022-12-25 ENCOUNTER — Ambulatory Visit: Payer: PPO

## 2022-12-25 VITALS — BP 148/68 | HR 54 | Ht 74.0 in | Wt 226.2 lb

## 2022-12-25 DIAGNOSIS — I493 Ventricular premature depolarization: Secondary | ICD-10-CM

## 2022-12-25 DIAGNOSIS — I447 Left bundle-branch block, unspecified: Secondary | ICD-10-CM | POA: Diagnosis not present

## 2022-12-25 DIAGNOSIS — I5022 Chronic systolic (congestive) heart failure: Secondary | ICD-10-CM

## 2022-12-25 NOTE — Patient Instructions (Signed)
Medication Instructions:  The current medical regimen is effective;  continue present plan and medications.  *If you need a refill on your cardiac medications before your next appointment, please call your pharmacy*   Testing/Procedures:  ZIO XT- Long Term Monitor Instructions  Your physician has requested you wear a ZIO patch monitor for 7 days.  This is a single patch monitor. [employer] supplies one patch monitor per enrollment. Additional stickers are not available. Please do not apply patch if you will be having a Nuclear Stress Test,  Echocardiogram, Cardiac CT, MRI, or Chest Xray during the period you would be wearing the  monitor. The patch cannot be worn during these tests. You cannot remove and re-apply the  ZIO XT patch monitor.  Your ZIO patch monitor will be mailed 3 day USPS to your address on file. It may take 3-5 days  to receive your monitor after you have been enrolled.  Once you have received your monitor, please review the enclosed instructions. Your monitor  has already been registered assigning a specific monitor serial # to you.  Billing and Patient Assistance Program Information  We have supplied [employer] with any of your insurance information on file for billing purposes. [employer] offers a sliding scale Patient Assistance Program for patients that do not have  insurance, or whose insurance does not completely cover the cost of the ZIO monitor.  You must apply for the Patient Assistance Program to qualify for this discounted rate.  To apply, please call [employer] at [phone], select option 4, select option 2, ask to apply for  Patient Assistance Program. Meredeth Ide will ask your household income, and how many people  are in your household. They will quote your out-of-pocket cost based on that information.  [employer] will also be able to set up a 70-month, interest-free payment plan if needed.  Applying the monitor   Shave hair from upper left chest.  Hold abrader  disc by orange tab. Rub abrader in 40 strokes over the upper left chest as  indicated in your monitor instructions.  Clean area with 4 enclosed alcohol pads. Let dry.  Apply patch as indicated in monitor instructions. Patch will be placed under collarbone on left  side of chest with arrow pointing upward.  Rub patch adhesive wings for 2 minutes. Remove white label marked "1". Remove the white  label marked "2". Rub patch adhesive wings for 2 additional minutes.  While looking in a mirror, press and release button in center of patch. A small green light will  flash 3-4 times. This will be your only indicator that the monitor has been turned on.  Do not shower for the first 24 hours. You may shower after the first 24 hours.  Press the button if you feel a symptom. You will hear a small click. Record Date, Time and  Symptom in the Patient Logbook.  When you are ready to remove the patch, follow instructions on the last 2 pages of Patient  Logbook. Stick patch monitor onto the last page of Patient Logbook.  Place Patient Logbook in the blue and white box. Use locking tab on box and tape box closed  securely. The blue and white box has prepaid postage on it. Please place it in the mailbox as  soon as possible. Your physician should have your test results approximately 7 days after the  monitor has been mailed back to [facility].  Call [facility] Customer Care at [phone] if you have questions regarding  your ZIO  XT patch monitor. Call them immediately if you see an orange light blinking on your  monitor.  If your monitor falls off in less than 4 days, contact our Monitor department at [phone].  If your monitor becomes loose or falls off after 4 days call [employer] at [phone] for  suggestions on securing your monitor    Follow-Up: At [facility], you and your health needs are our priority.  As part of our continuing mission to provide you with exceptional heart  care, we have created designated Provider Care Teams.  These Care Teams include your primary Cardiologist (physician) and Advanced Practice Providers (APPs -  Physician Assistants and Nurse Practitioners) who all work together to provide you with the care you need, when you need it.  We recommend signing up for the patient portal called "MyChart".  Sign up information is provided on this After Visit Summary.  MyChart is used to connect with patients for Virtual Visits (Telemedicine).  Patients are able to view lab/test results, encounter notes, upcoming appointments, etc.  Non-urgent messages can be sent to your provider as well.   To learn more about what you can do with MyChart, go to [email or web address].    Your next appointment:   3 month(s)  Provider:   Steffanie Dunn, MD

## 2022-12-30 DIAGNOSIS — M5136 Other intervertebral disc degeneration, lumbar region: Secondary | ICD-10-CM | POA: Diagnosis not present

## 2022-12-30 DIAGNOSIS — Z23 Encounter for immunization: Secondary | ICD-10-CM | POA: Diagnosis not present

## 2022-12-30 DIAGNOSIS — Z79891 Long term (current) use of opiate analgesic: Secondary | ICD-10-CM | POA: Diagnosis not present

## 2022-12-30 DIAGNOSIS — N179 Acute kidney failure, unspecified: Secondary | ICD-10-CM | POA: Diagnosis not present

## 2022-12-30 DIAGNOSIS — I700 Atherosclerosis of aorta: Secondary | ICD-10-CM | POA: Diagnosis not present

## 2022-12-30 DIAGNOSIS — I5022 Chronic systolic (congestive) heart failure: Secondary | ICD-10-CM | POA: Diagnosis not present

## 2022-12-30 DIAGNOSIS — E041 Nontoxic single thyroid nodule: Secondary | ICD-10-CM | POA: Diagnosis not present

## 2022-12-30 DIAGNOSIS — M9903 Segmental and somatic dysfunction of lumbar region: Secondary | ICD-10-CM | POA: Diagnosis not present

## 2022-12-30 DIAGNOSIS — M62830 Muscle spasm of back: Secondary | ICD-10-CM | POA: Diagnosis not present

## 2022-12-30 DIAGNOSIS — Z1509 Genetic susceptibility to other malignant neoplasm: Secondary | ICD-10-CM | POA: Diagnosis not present

## 2022-12-30 DIAGNOSIS — M9902 Segmental and somatic dysfunction of thoracic region: Secondary | ICD-10-CM | POA: Diagnosis not present

## 2022-12-30 LAB — CALCULI, WITH PHOTOGRAPH (CLINICAL LAB)
Uric Acid Calculi: 100 %
Weight Calculi: 3 mg

## 2023-01-02 ENCOUNTER — Ambulatory Visit (INDEPENDENT_AMBULATORY_CARE_PROVIDER_SITE_OTHER): Payer: PPO | Admitting: Urology

## 2023-01-02 ENCOUNTER — Encounter: Payer: Self-pay | Admitting: Urology

## 2023-01-02 VITALS — BP 175/69 | HR 68 | Ht 73.0 in | Wt 222.0 lb

## 2023-01-02 DIAGNOSIS — Z466 Encounter for fitting and adjustment of urinary device: Secondary | ICD-10-CM

## 2023-01-02 DIAGNOSIS — N210 Calculus in bladder: Secondary | ICD-10-CM

## 2023-01-02 DIAGNOSIS — N200 Calculus of kidney: Secondary | ICD-10-CM

## 2023-01-02 LAB — MICROSCOPIC EXAMINATION: RBC, Urine: >30 /[HPF] — AB (ref 0–2)

## 2023-01-02 LAB — URINALYSIS, COMPLETE
Bilirubin, UA: NEGATIVE
Glucose, UA: NEGATIVE
Ketones, UA: NEGATIVE
Nitrite, UA: NEGATIVE
Specific Gravity, UA: >1.03 — ABNORMAL HIGH (ref 1.005–1.030)
Urobilinogen, Ur: 0.2 mg/dL (ref 0.2–1.0)
pH, UA: 5 (ref 5.0–7.5)

## 2023-01-02 MED ORDER — SULFAMETHOXAZOLE-TRIMETHOPRIM 800-160 MG PO TABS
1.0000 | ORAL_TABLET | Freq: Once | ORAL | Status: AC
Start: 2023-01-02 — End: 2023-01-02
  Administered 2023-01-02: 1 via ORAL

## 2023-01-02 NOTE — Patient Instructions (Signed)

## 2023-01-02 NOTE — Progress Notes (Unsigned)
   Indications: Patient is 67 y.o., who is s/p ureteroscopic removal of left distal ureteral calculi x 2 and cystolitholapaxy on 12/20/2022.  Moderate stent symptoms and no postoperative problems.  The patient is presenting today for stent removal.  Procedure:  Flexible Cystoscopy with stent removal (21308)  Timeout was performed and the correct patient, procedure and participants were identified.    Description:  The patient was prepped and draped in the usual sterile fashion. Flexible cystosopy was performed.  The stent was visualized, grasped, and removed intact without difficulty. The patient tolerated the procedure well.  A single dose of oral antibiotics was given.  Complications:  None  Plan:  Stone analysis 100% uric acid and metabolic evaluation recommended via [facility] Followed by Dr. Apolinar Junes for significant BPH and has a follow-up appointment scheduled February 2025   Irineo Axon, MD

## 2023-01-05 ENCOUNTER — Encounter: Payer: Self-pay | Admitting: Urology

## 2023-01-14 ENCOUNTER — Other Ambulatory Visit: Payer: PPO

## 2023-01-14 DIAGNOSIS — N200 Calculus of kidney: Secondary | ICD-10-CM | POA: Diagnosis not present

## 2023-01-15 DIAGNOSIS — I493 Ventricular premature depolarization: Secondary | ICD-10-CM | POA: Diagnosis not present

## 2023-01-15 LAB — LITHOLINK SERUM PANEL
CO2: 25 mmol/L (ref 20–29)
Calcium: 9.5 mg/dL (ref 8.6–10.2)
Chloride: 101 mmol/L (ref 96–106)
Creatinine, Ser: 1.26 mg/dL (ref 0.76–1.27)
Magnesium: 2 mg/dL (ref 1.6–2.3)
Phosphorus: 3.4 mg/dL (ref 2.8–4.1)
Potassium: 4.3 mmol/L (ref 3.5–5.2)
Sodium: 144 mmol/L (ref 134–144)
Uric Acid: 5.5 mg/dL (ref 3.8–8.4)
eGFR: 63 mL/min/{1.73_m2} (ref >59)

## 2023-01-20 LAB — LITHOLINK 24HR URINE PANEL
Ammonium, Urine: 35 mmol/(24.h) (ref 15–60)
Calcium Oxalate Saturation: 3.6 — ABNORMAL LOW (ref 6.00–10.00)
Calcium Phosphate Saturation: 0.28 — ABNORMAL LOW (ref 0.50–2.00)
Calcium, Urine: 91 mg/(24.h) (ref <250)
Calcium/Creatinine Ratio: 50 mg/g{creat} (ref 34–196)
Calcium/Kg Body Weight: 0.9 mg/kg/d (ref <4.0)
Chloride, Urine: 200 mmol/(24.h) (ref 70–250)
Citrate, Urine: 373 mg/(24.h) — ABNORMAL LOW (ref >450)
Creatinine, Urine: 1820 mg/(24.h)
Creatinine/Kg Body Weight: 18.8 mg/kg/d (ref 11.9–24.4)
Cystine, Urine, Qualitative: NEGATIVE
Magnesium, Urine: 129 mg/(24.h) — ABNORMAL HIGH (ref 30–120)
Oxalate, Urine: 31 mg/(24.h) (ref 20–40)
Phosphorus, Urine: 1208 mg/(24.h) — ABNORMAL HIGH (ref 600–1200)
Potassium, Urine: 62 mmol/(24.h) (ref 20–100)
Protein Catabolic Rate: 0.9 g/kg/d (ref 0.8–1.4)
Sodium, Urine: 200 mmol/(24.h) — ABNORMAL HIGH (ref 50–150)
Sulfate, Urine: 24 meq/(24.h) (ref 20–80)
Urea Nitrogen, Urine: 11.21 g/(24.h) (ref 6.00–14.00)
Uric Acid Saturation: 2.92 — ABNORMAL HIGH (ref <1.00)
Uric Acid, Urine: 771 mg/(24.h) (ref <800)
Urine Volume (Preserved): 1330 mL/(24.h) (ref 500–4000)
pH, 24 hr, Urine: 5.415 — ABNORMAL LOW (ref 5.800–6.200)

## 2023-02-06 DIAGNOSIS — R7303 Prediabetes: Secondary | ICD-10-CM | POA: Diagnosis not present

## 2023-02-06 DIAGNOSIS — K2100 Gastro-esophageal reflux disease with esophagitis, without bleeding: Secondary | ICD-10-CM | POA: Diagnosis not present

## 2023-02-06 DIAGNOSIS — I2510 Atherosclerotic heart disease of native coronary artery without angina pectoris: Secondary | ICD-10-CM | POA: Diagnosis not present

## 2023-02-06 DIAGNOSIS — I10 Essential (primary) hypertension: Secondary | ICD-10-CM | POA: Diagnosis not present

## 2023-02-06 DIAGNOSIS — E782 Mixed hyperlipidemia: Secondary | ICD-10-CM | POA: Diagnosis not present

## 2023-02-06 DIAGNOSIS — Z79891 Long term (current) use of opiate analgesic: Secondary | ICD-10-CM | POA: Diagnosis not present

## 2023-02-11 ENCOUNTER — Other Ambulatory Visit: Payer: Self-pay | Admitting: Urology

## 2023-02-11 DIAGNOSIS — Z7901 Long term (current) use of anticoagulants: Secondary | ICD-10-CM | POA: Diagnosis not present

## 2023-02-11 DIAGNOSIS — E042 Nontoxic multinodular goiter: Secondary | ICD-10-CM | POA: Diagnosis not present

## 2023-02-13 DIAGNOSIS — G8929 Other chronic pain: Secondary | ICD-10-CM | POA: Diagnosis not present

## 2023-02-13 DIAGNOSIS — M545 Low back pain, unspecified: Secondary | ICD-10-CM | POA: Diagnosis not present

## 2023-02-13 DIAGNOSIS — E782 Mixed hyperlipidemia: Secondary | ICD-10-CM | POA: Diagnosis not present

## 2023-02-13 DIAGNOSIS — K2100 Gastro-esophageal reflux disease with esophagitis, without bleeding: Secondary | ICD-10-CM | POA: Diagnosis not present

## 2023-02-13 DIAGNOSIS — I10 Essential (primary) hypertension: Secondary | ICD-10-CM | POA: Diagnosis not present

## 2023-02-13 DIAGNOSIS — I2510 Atherosclerotic heart disease of native coronary artery without angina pectoris: Secondary | ICD-10-CM | POA: Diagnosis not present

## 2023-02-13 DIAGNOSIS — Z79891 Long term (current) use of opiate analgesic: Secondary | ICD-10-CM | POA: Diagnosis not present

## 2023-02-13 DIAGNOSIS — Z8551 Personal history of malignant neoplasm of bladder: Secondary | ICD-10-CM | POA: Diagnosis not present

## 2023-02-13 DIAGNOSIS — Z1509 Genetic susceptibility to other malignant neoplasm: Secondary | ICD-10-CM | POA: Diagnosis not present

## 2023-02-13 DIAGNOSIS — I700 Atherosclerosis of aorta: Secondary | ICD-10-CM | POA: Diagnosis not present

## 2023-02-13 DIAGNOSIS — Z131 Encounter for screening for diabetes mellitus: Secondary | ICD-10-CM | POA: Diagnosis not present

## 2023-02-13 DIAGNOSIS — I5022 Chronic systolic (congestive) heart failure: Secondary | ICD-10-CM | POA: Diagnosis not present

## 2023-04-15 ENCOUNTER — Ambulatory Visit: Payer: PPO | Admitting: Cardiology

## 2023-04-23 DIAGNOSIS — I2510 Atherosclerotic heart disease of native coronary artery without angina pectoris: Secondary | ICD-10-CM | POA: Diagnosis not present

## 2023-04-23 DIAGNOSIS — E782 Mixed hyperlipidemia: Secondary | ICD-10-CM | POA: Diagnosis not present

## 2023-04-23 DIAGNOSIS — I493 Ventricular premature depolarization: Secondary | ICD-10-CM | POA: Diagnosis not present

## 2023-04-23 DIAGNOSIS — Z79899 Other long term (current) drug therapy: Secondary | ICD-10-CM | POA: Diagnosis not present

## 2023-04-23 DIAGNOSIS — I10 Essential (primary) hypertension: Secondary | ICD-10-CM | POA: Diagnosis not present

## 2023-04-23 DIAGNOSIS — I700 Atherosclerosis of aorta: Secondary | ICD-10-CM | POA: Diagnosis not present

## 2023-04-23 DIAGNOSIS — I5022 Chronic systolic (congestive) heart failure: Secondary | ICD-10-CM | POA: Diagnosis not present

## 2023-05-03 ENCOUNTER — Telehealth: Payer: Self-pay | Admitting: Urology

## 2023-05-03 NOTE — Telephone Encounter (Signed)
Metabolic stone evaluation reviewed.  Blood work was normal.  Stone was 100% uric acid.  His most significant abnormalities were a low urine volume at 1.33 L and a low urine pH.  Recommend he increase his water intake to keep urine output at 2.5 L/day or greater.  Drinking 3 quarts of water per day is typically enough to produce this urine output.  Would also recommend starting potassium citrate to increase his urine pH.  These 2 measures will help to prevent crystallization of uric acid in the urine.  He has a follow-up appoint with Dr. Apolinar Junes next month.  Please send in Rx potassium citrate 15 mEq twice daily with meals

## 2023-05-05 ENCOUNTER — Other Ambulatory Visit: Payer: Self-pay | Admitting: *Deleted

## 2023-05-05 DIAGNOSIS — K2100 Gastro-esophageal reflux disease with esophagitis, without bleeding: Secondary | ICD-10-CM | POA: Diagnosis not present

## 2023-05-05 DIAGNOSIS — I10 Essential (primary) hypertension: Secondary | ICD-10-CM | POA: Diagnosis not present

## 2023-05-05 DIAGNOSIS — Z79891 Long term (current) use of opiate analgesic: Secondary | ICD-10-CM | POA: Diagnosis not present

## 2023-05-05 MED ORDER — POTASSIUM CITRATE ER 15 MEQ (1620 MG) PO TBCR: 1.0000 | EXTENDED_RELEASE_TABLET | Freq: Two times a day (BID) | ORAL | 1 refills | Status: DC

## 2023-05-05 NOTE — Telephone Encounter (Signed)
Notified patient as instructed, patient pleased.  Rx sent to [facility]

## 2023-05-07 ENCOUNTER — Encounter: Payer: Self-pay | Admitting: Urology

## 2023-05-08 DIAGNOSIS — I10 Essential (primary) hypertension: Secondary | ICD-10-CM | POA: Diagnosis not present

## 2023-05-08 DIAGNOSIS — K2100 Gastro-esophageal reflux disease with esophagitis, without bleeding: Secondary | ICD-10-CM | POA: Diagnosis not present

## 2023-05-08 DIAGNOSIS — R7309 Other abnormal glucose: Secondary | ICD-10-CM | POA: Diagnosis not present

## 2023-05-08 DIAGNOSIS — E782 Mixed hyperlipidemia: Secondary | ICD-10-CM | POA: Diagnosis not present

## 2023-05-08 NOTE — Telephone Encounter (Signed)
Notified patient as instructed, patient states all the potassium formulary about that high he said.He states he will talk to his pharamcy about the side efforts. He will call us back if he needs our help. He will be seeing DR. Apolinar Junes in February

## 2023-05-12 DIAGNOSIS — G8929 Other chronic pain: Secondary | ICD-10-CM | POA: Diagnosis not present

## 2023-05-12 DIAGNOSIS — M545 Low back pain, unspecified: Secondary | ICD-10-CM | POA: Diagnosis not present

## 2023-05-12 DIAGNOSIS — E782 Mixed hyperlipidemia: Secondary | ICD-10-CM | POA: Diagnosis not present

## 2023-05-12 DIAGNOSIS — D696 Thrombocytopenia, unspecified: Secondary | ICD-10-CM | POA: Diagnosis not present

## 2023-05-12 DIAGNOSIS — I5022 Chronic systolic (congestive) heart failure: Secondary | ICD-10-CM | POA: Diagnosis not present

## 2023-05-12 DIAGNOSIS — E559 Vitamin D deficiency, unspecified: Secondary | ICD-10-CM | POA: Diagnosis not present

## 2023-05-12 DIAGNOSIS — R7303 Prediabetes: Secondary | ICD-10-CM | POA: Diagnosis not present

## 2023-05-12 DIAGNOSIS — Z8551 Personal history of malignant neoplasm of bladder: Secondary | ICD-10-CM | POA: Diagnosis not present

## 2023-05-12 DIAGNOSIS — I2510 Atherosclerotic heart disease of native coronary artery without angina pectoris: Secondary | ICD-10-CM | POA: Diagnosis not present

## 2023-05-12 DIAGNOSIS — I700 Atherosclerosis of aorta: Secondary | ICD-10-CM | POA: Diagnosis not present

## 2023-05-12 DIAGNOSIS — I10 Essential (primary) hypertension: Secondary | ICD-10-CM | POA: Diagnosis not present

## 2023-05-12 DIAGNOSIS — Z79891 Long term (current) use of opiate analgesic: Secondary | ICD-10-CM | POA: Diagnosis not present

## 2023-05-14 DIAGNOSIS — Z1211 Encounter for screening for malignant neoplasm of colon: Secondary | ICD-10-CM | POA: Diagnosis not present

## 2023-05-14 DIAGNOSIS — I5022 Chronic systolic (congestive) heart failure: Secondary | ICD-10-CM | POA: Diagnosis not present

## 2023-05-14 DIAGNOSIS — Z7901 Long term (current) use of anticoagulants: Secondary | ICD-10-CM | POA: Diagnosis not present

## 2023-05-14 DIAGNOSIS — Z800 Family history of malignant neoplasm of digestive organs: Secondary | ICD-10-CM | POA: Diagnosis not present

## 2023-05-14 DIAGNOSIS — Z9049 Acquired absence of other specified parts of digestive tract: Secondary | ICD-10-CM | POA: Diagnosis not present

## 2023-05-14 DIAGNOSIS — Z860101 Personal history of adenomatous and serrated colon polyps: Secondary | ICD-10-CM | POA: Diagnosis not present

## 2023-05-14 DIAGNOSIS — K432 Incisional hernia without obstruction or gangrene: Secondary | ICD-10-CM | POA: Diagnosis not present

## 2023-05-14 DIAGNOSIS — Z1509 Genetic susceptibility to other malignant neoplasm: Secondary | ICD-10-CM | POA: Diagnosis not present

## 2023-05-14 DIAGNOSIS — I10 Essential (primary) hypertension: Secondary | ICD-10-CM | POA: Diagnosis not present

## 2023-05-14 DIAGNOSIS — K2100 Gastro-esophageal reflux disease with esophagitis, without bleeding: Secondary | ICD-10-CM | POA: Diagnosis not present

## 2023-05-14 DIAGNOSIS — Z79891 Long term (current) use of opiate analgesic: Secondary | ICD-10-CM | POA: Diagnosis not present

## 2023-05-19 ENCOUNTER — Other Ambulatory Visit: Payer: Self-pay

## 2023-05-19 DIAGNOSIS — N3943 Post-void dribbling: Secondary | ICD-10-CM

## 2023-05-19 DIAGNOSIS — Z87898 Personal history of other specified conditions: Secondary | ICD-10-CM

## 2023-05-20 ENCOUNTER — Encounter: Admission: RE | Payer: Self-pay | Source: Ambulatory Visit

## 2023-05-20 ENCOUNTER — Ambulatory Visit: Admission: RE | Admit: 2023-05-20 | Payer: PPO | Source: Ambulatory Visit | Admitting: Internal Medicine

## 2023-05-20 SURGERY — COLONOSCOPY WITH PROPOFOL
Anesthesia: General

## 2023-05-21 ENCOUNTER — Emergency Department
Admission: EM | Admit: 2023-05-21 | Discharge: 2023-05-21 | Disposition: A | Discharge status: Home / Self Care | Payer: PPO | Attending: Student in an Organized Health Care Education/Training Program | Admitting: Student in an Organized Health Care Education/Training Program

## 2023-05-21 ENCOUNTER — Emergency Department: Payer: PPO

## 2023-05-21 DIAGNOSIS — N200 Calculus of kidney: Secondary | ICD-10-CM | POA: Insufficient documentation

## 2023-05-21 DIAGNOSIS — I10 Essential (primary) hypertension: Secondary | ICD-10-CM | POA: Diagnosis not present

## 2023-05-21 DIAGNOSIS — I7143 Infrarenal abdominal aortic aneurysm, without rupture: Secondary | ICD-10-CM | POA: Diagnosis not present

## 2023-05-21 DIAGNOSIS — K7689 Other specified diseases of liver: Secondary | ICD-10-CM | POA: Diagnosis not present

## 2023-05-21 DIAGNOSIS — R1032 Left lower quadrant pain: Secondary | ICD-10-CM | POA: Diagnosis present

## 2023-05-21 DIAGNOSIS — N132 Hydronephrosis with renal and ureteral calculous obstruction: Secondary | ICD-10-CM | POA: Diagnosis not present

## 2023-05-21 DIAGNOSIS — Z8551 Personal history of malignant neoplasm of bladder: Secondary | ICD-10-CM | POA: Diagnosis not present

## 2023-05-21 DIAGNOSIS — N281 Cyst of kidney, acquired: Secondary | ICD-10-CM | POA: Diagnosis not present

## 2023-05-21 DIAGNOSIS — Z87442 Personal history of urinary calculi: Secondary | ICD-10-CM | POA: Insufficient documentation

## 2023-05-21 DIAGNOSIS — I2510 Atherosclerotic heart disease of native coronary artery without angina pectoris: Secondary | ICD-10-CM | POA: Insufficient documentation

## 2023-05-21 HISTORY — DX: Disorder of kidney and ureter, unspecified: N28.9

## 2023-05-21 LAB — CBC
HCT: 48.1 % (ref 39.0–52.0)
Hemoglobin: 15.9 g/dL (ref 13.0–17.0)
MCH: 30.7 pg (ref 26.0–34.0)
MCHC: 33.1 g/dL (ref 30.0–36.0)
MCV: 92.9 fL (ref 80.0–100.0)
Platelets: 148 10*3/uL — ABNORMAL LOW (ref 150–400)
RBC: 5.18 MIL/uL (ref 4.22–5.81)
RDW: 12.9 % (ref 11.5–15.5)
WBC: 6.3 10*3/uL (ref 4.0–10.5)
nRBC: 0 % (ref 0.0–0.2)

## 2023-05-21 LAB — URINALYSIS, ROUTINE W REFLEX MICROSCOPIC
Bilirubin Urine: NEGATIVE
Glucose, UA: NEGATIVE mg/dL
Ketones, ur: NEGATIVE mg/dL
Leukocytes,Ua: NEGATIVE
Nitrite: NEGATIVE
Protein, ur: NEGATIVE mg/dL
RBC / HPF: >50 RBC/hpf (ref 0–5)
Specific Gravity, Urine: 1.017 (ref 1.005–1.030)
Squamous Epithelial / HPF: 0 /[HPF] (ref 0–5)
pH: 5 (ref 5.0–8.0)

## 2023-05-21 LAB — COMPREHENSIVE METABOLIC PANEL
ALT: 22 U/L (ref 0–44)
AST: 20 U/L (ref 15–41)
Albumin: 4 g/dL (ref 3.5–5.0)
Alkaline Phosphatase: 54 U/L (ref 38–126)
Anion gap: 9 (ref 5–15)
BUN: 21 mg/dL (ref 8–23)
CO2: 28 mmol/L (ref 22–32)
Calcium: 9.3 mg/dL (ref 8.9–10.3)
Chloride: 102 mmol/L (ref 98–111)
Creatinine, Ser: 1.17 mg/dL (ref 0.61–1.24)
GFR, Estimated: >60 mL/min (ref >60)
Glucose, Bld: 153 mg/dL — ABNORMAL HIGH (ref 70–99)
Potassium: 3.7 mmol/L (ref 3.5–5.1)
Sodium: 139 mmol/L (ref 135–145)
Total Bilirubin: 0.5 mg/dL (ref 0.0–1.2)
Total Protein: 7.4 g/dL (ref 6.5–8.1)

## 2023-05-21 MED ORDER — HYDROMORPHONE HCL 1 MG/ML IJ SOLN
1.0000 mg | Freq: Once | INTRAMUSCULAR | Status: AC
  Administered 2023-05-21: 1 mg via INTRAVENOUS
  Filled 2023-05-21: qty 1

## 2023-05-21 MED ORDER — OXYCODONE-ACETAMINOPHEN 5-325 MG PO TABS: 1.0000 | ORAL_TABLET | ORAL | 0 refills | Status: DC | PRN

## 2023-05-21 MED ORDER — SODIUM CHLORIDE 0.9 % IV BOLUS
1000.0000 mL | Freq: Once | INTRAVENOUS | Status: AC
  Administered 2023-05-21: 1000 mL via INTRAVENOUS

## 2023-05-21 MED ORDER — KETOROLAC TROMETHAMINE 10 MG PO TABS: 10.0000 mg | ORAL_TABLET | Freq: Four times a day (QID) | ORAL | 0 refills | Status: DC | PRN

## 2023-05-21 MED ORDER — KETOROLAC TROMETHAMINE 15 MG/ML IJ SOLN
15.0000 mg | Freq: Once | INTRAMUSCULAR | Status: AC
  Administered 2023-05-21: 15 mg via INTRAVENOUS
  Filled 2023-05-21: qty 1

## 2023-05-21 MED ORDER — MORPHINE SULFATE (PF) 4 MG/ML IV SOLN
4.0000 mg | Freq: Once | INTRAVENOUS | Status: AC
  Administered 2023-05-21: 4 mg via INTRAVENOUS
  Filled 2023-05-21: qty 1

## 2023-05-21 MED ORDER — ONDANSETRON 4 MG PO TBDP
4.0000 mg | ORAL_TABLET | Freq: Once | ORAL | Status: AC | PRN
  Administered 2023-05-21: 4 mg via ORAL
  Filled 2023-05-21: qty 1

## 2023-05-21 MED ORDER — KETOROLAC TROMETHAMINE 15 MG/ML IJ SOLN
15.0000 mg | Freq: Once | INTRAMUSCULAR | Status: AC
Start: 2023-05-21 — End: 2023-05-21
  Administered 2023-05-21: 15 mg via INTRAVENOUS
  Filled 2023-05-21: qty 1

## 2023-05-21 NOTE — ED Notes (Signed)
Patient asked for a blanket and said his pain is increasing. PA-C aware.

## 2023-05-21 NOTE — ED Notes (Signed)
Pt back to the front desk to let us know he is having pain still.

## 2023-05-21 NOTE — ED Notes (Signed)
Patient states his wife will come and pick him up.

## 2023-05-21 NOTE — ED Notes (Signed)
Pt came back up to the front desk to advise that he "spit up acid" in the bathroom.

## 2023-05-21 NOTE — ED Notes (Signed)
Pt came to the front desk stating that he is not able to urinate. Pt states that he normally does not have an issue urinating.

## 2023-05-21 NOTE — ED Notes (Signed)
Patient's wife is coming to pick up the patient.

## 2023-05-21 NOTE — ED Notes (Signed)
Patient ambulated with a steady gait to hallway bathroom

## 2023-05-21 NOTE — ED Notes (Signed)
Patient states he has tried several times since arriving, but is unable to void at this time.

## 2023-05-21 NOTE — ED Notes (Signed)
Patient's wife is at bedside. Patient ambulated to and from  hallway bathroom with a slow, steady gait.

## 2023-05-21 NOTE — ED Notes (Addendum)
Will administer Hydromorphone after some of the fluids have infused due to VS. Greig Right PA-C aware.

## 2023-05-21 NOTE — ED Triage Notes (Signed)
Pt c/o LLQ pain and nausea starting this morning.  Pain score 9/10.  Pt reports "this feels like it did when I had kidney stones around 4 months ago."

## 2023-05-21 NOTE — ED Provider Notes (Signed)
[facility] Provider Note    Event Date/Time   First MD Initiated Contact with Patient 05/21/23 1259     (approximate)   History   Abdominal Pain   HPI  Jeffrey Buchanan is a 68 y.o. male with history of bladder cancer, hyperlipidemia, kidney stones, renal disorder,.  CAD, hypertension presents emergency department with complaints of left lower quadrant pain.  States had blood in urine over the weekend.  Feels like it is kidney stone.  Having severe pain.  Difficulty with urination.  No fever or chills.  Symptoms been ongoing for about 3 days.      Physical Exam   Triage Vital Signs: ED Triage Vitals  Encounter Vitals Group     BP 05/21/23 1150 (!) 151/77     Systolic BP Percentile --      Diastolic BP Percentile --      Pulse Rate 05/21/23 1150 (!) 50     Resp 05/21/23 1150 20     Temp --      Temp Source 05/21/23 1150 Oral     SpO2 05/21/23 1150 98 %     Weight 05/21/23 1151 222 lb (100.7 kg)     Height 05/21/23 1151 6\' 1"  (1.854 m)     Head Circumference --      Peak Flow --      Pain Score 05/21/23 1151 9     Pain Loc --      Pain Education --      Exclude from Growth Chart --     Most recent vital signs: Vitals:   05/21/23 1630 05/21/23 1732  BP: (!) 133/56 (!) 140/77  Pulse: (!) 50 (!) 49  Resp:  16  Temp:    SpO2: 99% 100%     General: Awake, no distress.   CV:  Good peripheral perfusion. regular rate and  rhythm Resp:  Normal effort. Lungs cta Abd:  No distention.  Tender left lower quadrant, no CVA tenderness Other:      ED Results / Procedures / Treatments   Labs (all labs ordered are listed, but only abnormal results are displayed) Labs Reviewed  COMPREHENSIVE METABOLIC PANEL - Abnormal; Notable for the following components:      Result Value   Glucose, Bld 153 (*)    All other components within normal limits  CBC - Abnormal; Notable for the following components:   Platelets 148 (*)    All other components  within normal limits  URINALYSIS, ROUTINE W REFLEX MICROSCOPIC - Abnormal; Notable for the following components:   Color, Urine YELLOW (*)    APPearance CLOUDY (*)    Hgb urine dipstick LARGE (*)    Bacteria, UA RARE (*)    All other components within normal limits     EKG     RADIOLOGY CT renal stone    PROCEDURES:   Procedures Chief Complaint  Patient presents with   Abdominal Pain      MEDICATIONS ORDERED IN ED: Medications  ondansetron (ZOFRAN-ODT) disintegrating tablet 4 mg (4 mg Oral Given 05/21/23 1155)  sodium chloride 0.9 % bolus 1,000 mL (0 mLs Intravenous Stopped 05/21/23 1515)  ketorolac (TORADOL) 15 MG/ML injection 15 mg (15 mg Intravenous Given 05/21/23 1343)  ketorolac (TORADOL) 15 MG/ML injection 15 mg (15 mg Intravenous Given 05/21/23 1419)  morphine (PF) 4 MG/ML injection 4 mg (4 mg Intravenous Given 05/21/23 1540)  HYDROmorphone (DILAUDID) injection 1 mg (1 mg Intravenous Given 05/21/23 1712)  sodium chloride 0.9 % bolus 1,000 mL (1,000 mLs Intravenous New Bag/Given 05/21/23 1619)     IMPRESSION / MDM / ASSESSMENT AND PLAN / ED COURSE  I reviewed the triage vital signs and the nursing notes.                              Differential diagnosis includes, but is not limited to, kidney stone, diverticulitis, obstruction, UTI, pyelonephritis, bladder cancer  Patient's presentation is most consistent with acute illness / injury with system symptoms.   Labs are reassuring, UA is pending  Patient does state that he feels dehydrated so we will go ahead and do IV with normal saline 1 L, patient request Toradol instead of a narcotic so Toradol 15 mg IV.  CT renal stone ordered   CT renal stone study shows a 8.6 mm stone at the left ureteropelvic junction.  This is independently reviewed interpreted by me  I did explain this to the patient.  He has had Toradol and morphine.  He continues to have pain.  Will go ahead and order Dilaudid 1 mg IV along with  another bag of fluids.  Last time he had a kidney stone it was 5 mm and they had put a stent in and break the stone up.  Consult to urology, Dr. Lonna Cobb states is pain can be controlled he can be discharged with pain medication, Toradol for lithotripsy would not be until next week.  If pain is not controlled admit hospitalist  I did discuss this with the patient.  I did offer admission.  Patient states since his pain is better controlled with the Dilaudid that he would like to try going home.  I did tell him to follow-up with Dr. Lonna Cobb.  Return emergency department worsening.  He states he understands.  I did tell him Dr. Lonna Cobb may be able to do a ureteroscopy on Tuesday so he should definitely follow-up on Monday.  Patient is in agreement with treatment plan.  Discharged stable condition.   FINAL CLINICAL IMPRESSION(S) / ED DIAGNOSES   Final diagnoses:  Kidney stone     Rx / DC Orders   ED Discharge Orders          Ordered    ketorolac (TORADOL) 10 MG tablet  Every 6 hours PRN        05/21/23 1744    oxyCODONE-acetaminophen (PERCOCET) 5-325 MG tablet  Every 4 hours PRN       Note to Pharmacy: Breakthrough pain control due to very large kidney stone   05/21/23 1744             Note:  This document was prepared using Dragon voice recognition software and may include unintentional dictation errors.    Faythe Ghee, PA-C 05/21/23 1806    Willy Eddy, MD 05/25/23 [phone]

## 2023-05-21 NOTE — Discharge Instructions (Addendum)
Stop Plavix while taking Toradol and prior to lithotripsy Follow-up with Dr. Lonna Cobb, states he most likely could do a ureteroscopic on Tuesday if you are still having pain Return emergency department if worsening

## 2023-05-22 ENCOUNTER — Other Ambulatory Visit: Payer: Self-pay | Admitting: Urology

## 2023-05-22 ENCOUNTER — Telehealth: Payer: Self-pay

## 2023-05-22 ENCOUNTER — Telehealth: Payer: Self-pay | Admitting: *Deleted

## 2023-05-22 ENCOUNTER — Other Ambulatory Visit: Payer: Self-pay

## 2023-05-22 DIAGNOSIS — N1330 Unspecified hydronephrosis: Secondary | ICD-10-CM

## 2023-05-22 DIAGNOSIS — N201 Calculus of ureter: Secondary | ICD-10-CM

## 2023-05-22 DIAGNOSIS — N200 Calculus of kidney: Secondary | ICD-10-CM

## 2023-05-22 NOTE — Progress Notes (Signed)
  Phone Number: [phone] for Surgical Coordinator Fax Number: [phone]  REQUEST FOR SURGICAL CLEARANCE       Date: Date: 05/22/23  [facility]  Surgeon: Dr. Irineo Axon, MD  Date of Surgery: 05/26/2023- Urgent Add on  Operation: Left Ureteroscopy with Laser Lithotripsy and Stent Placement  Anesthesia Type: General   Diagnosis: 8mm Ureteral Stone, Left Hydronephrosis  Patient Requires:   Cardiac / Vascular Clearance : Yes  Reason: Patient will need to hold Plavix prior to surgery (Patient reports that he has been holding this for last few days due to hematuria)     Risk Assessment:    Low   []       Moderate   []     High   []           This patient is optimized for surgery  YES []       NO   []    I recommend further assessment/workup prior to surgery. YES []      NO  []   Appointment scheduled for: _______________________   Further recommendations: ____________________________________     Physician Signature:__________________________________   Printed Name: ________________________________________   Date: _________________

## 2023-05-22 NOTE — Telephone Encounter (Signed)
S/w the pt and his wife and pt has been added on 05/25/23 per Robin Searing, NP due to urgent procedure 05/26/23. Pt has been holding ASA and Plavix see previous notes.    Med rec and consent are done.      Patient Consent for Virtual Visit        ANANIAS KOLANDER has provided verbal consent on 05/22/2023 for a virtual visit (video or telephone).   CONSENT FOR VIRTUAL VISIT FOR:  [name]  By participating in this virtual visit I agree to the following:  I hereby voluntarily request, consent and authorize [facility] and its employed or contracted physicians, physician assistants, nurse practitioners or other licensed health care professionals (the Practitioner), to provide me with telemedicine health care services (the "Services") as deemed necessary by the treating Practitioner. I acknowledge and consent to receive the Services by the Practitioner via telemedicine. I understand that the telemedicine visit will involve communicating with the Practitioner through live audiovisual communication technology and the disclosure of certain medical information by electronic transmission. I acknowledge that I have been given the opportunity to request an in-person assessment or other available alternative prior to the telemedicine visit and am voluntarily participating in the telemedicine visit.  I understand that I have the right to withhold or withdraw my consent to the use of telemedicine in the course of my care at any time, without affecting my right to future care or treatment, and that the Practitioner or I may terminate the telemedicine visit at any time. I understand that I have the right to inspect all information obtained and/or recorded in the course of the telemedicine visit and may receive copies of available information for a reasonable fee.  I understand that some of the potential risks of receiving the Services via telemedicine include:  Delay or interruption in medical  evaluation due to technological equipment failure or disruption; Information transmitted may not be sufficient (e.g. poor resolution of images) to allow for appropriate medical decision making by the Practitioner; and/or  In rare instances, security protocols could fail, causing a breach of personal health information.  Furthermore, I acknowledge that it is my responsibility to provide information about my medical history, conditions and care that is complete and accurate to the best of my ability. I acknowledge that Practitioner's advice, recommendations, and/or decision may be based on factors not within their control, such as incomplete or inaccurate data provided by me or distortions of diagnostic images or specimens that may result from electronic transmissions. I understand that the practice of medicine is not an exact science and that Practitioner makes no warranties or guarantees regarding treatment outcomes. I acknowledge that a copy of this consent can be made available to me via my patient portal [facility] MyChart), or I can request a printed copy by calling the office of [facility].    I understand that my insurance will be billed for this visit.   I have read or had this consent read to me. I understand the contents of this consent, which adequately explains the benefits and risks of the Services being provided via telemedicine.  I have been provided ample opportunity to ask questions regarding this consent and the Services and have had my questions answered to my satisfaction. I give my informed consent for the services to be provided through the use of telemedicine in my medical care

## 2023-05-22 NOTE — Telephone Encounter (Signed)
-----   Message from [facility] Melissa H sent at 05/22/2023 12:18 PM EST ----- Urgent Surgical Clearance

## 2023-05-22 NOTE — Progress Notes (Signed)
Surgical Physician Order Form [facility] [place]  Dr. Lonna Cobb, MD  * Scheduling expectation : 05/26/2023  *Length of Case: 90 min  *Clearance needed: no  *Anticoagulation Instructions: Hold all anticoagulants  *Aspirin Instructions: Hold Aspirin and Plavix  *Post-op visit Date/Instructions:  1 week cysto stent removal  *Diagnosis: Left Nephrolithiasis  *Procedure: left Ureteroscopy w/laser lithotripsy & stent placement (16109)   Additional orders: N/A  -Admit type: Observation  -Anesthesia: General  -VTE Prophylaxis Standing Order SCD's       Other:   -Standing Lab Orders Per Anesthesia    Lab other: None  -Standing Test orders EKG/Chest x-ray per Anesthesia       Test other:   - Medications:  Ceftriaxone(Rocephin) 1gm IV  -Other orders:  ED was hving him hold plavix and asa

## 2023-05-22 NOTE — Telephone Encounter (Signed)
Left a message on patient voice mail for patient to come in the office at 10:30 to see Dr. Lonna Cobb . Made appt.

## 2023-05-22 NOTE — Telephone Encounter (Signed)
   Name: Jeffrey Buchanan  DOB: Feb 24, 1956  MRN: [id number]  Primary Cardiologist: Marcina Millard, MD   Preoperative team, please contact this patient and set up a phone call appointment for further preoperative risk assessment. Please obtain consent and complete medication review. Thank you for your help.  I confirm that guidance regarding antiplatelet and oral anticoagulation therapy has been completed and, if necessary, noted below.  Patient is currently on Plavix and ASA however was instructed to hold due to hematuria by his urologist.  Per protocol holding Plavix would be fine for 5 days and normally would suggest continuing ASA if not high risk for bleeding.  I also confirmed the patient resides in the state of [place]. As per [facility] Medical Board telemedicine laws, the patient must reside in the state in which the provider is licensed.   Napoleon Form, Leodis Rains, NP 05/22/2023, 2:39 PM [facility]

## 2023-05-22 NOTE — Telephone Encounter (Signed)
  Per Dr. Lonna Cobb, MD Patient is to be scheduled for Left Ureteroscopy with Laser Lithotripsy and Stent Placement   Jeffrey Buchanan was contacted and possible surgical dates were discussed, Tuesday February 18th, 2025 was agreed upon for surgery.   Patient was directed to call [phone] between 1-3pm the day before surgery to find out surgical arrival time.  Instructions were given not to eat or drink from midnight on the night before surgery and have a driver for the day of surgery. On the surgery day patient was instructed to enter through the [facility] entrance of [facility] report the Same Day Surgery desk.   Pre-Admit Testing will be in contact via phone to set up an interview with the anesthesia team to review your history and medications prior to surgery.   Reminder of this information was sent via MyChart to the patient.

## 2023-05-22 NOTE — Telephone Encounter (Signed)
   Pre-operative Risk Assessment    Patient Name: Jeffrey Buchanan  DOB: 07-25-1955 MRN: [id number]   Date of last office visit: 12/25/22 Sherie Don, NP Date of next office visit: NONE   Request for Surgical Clearance    Procedure:   Left Ureteroscopy with Laser Lithotripsy and Stent Placement  Date of Surgery:  Clearance 05/26/23                                Surgeon: Dr. Irineo Axon, MD Surgeon's Group or Practice Name: [facility] Phone number:  [phone] Fax number:  [phone]   Type of Clearance Requested:   - Medical  - Pharmacy:  Hold Clopidogrel (Plavix) Patient will need to hold Plavix prior to surgery (Patient reports that he has been holding this for last few days due to hematuria)   Type of Anesthesia:  General    Additional requests/questions:    Elpidio Anis   05/22/2023, 2:23 PM    Phone Number: [phone] for Surgical Coordinator Fax Number: [phone]   REQUEST FOR SURGICAL CLEARANCE                                           Date: Date: 05/22/23   [facility]ed to[facility]   Surgeon: Dr. Irineo Axon, MD   Date of Surgery: 05/26/2023- Urgent Add on   Operation: Left Ureteroscopy with Laser Lithotripsy and Stent Placement   Anesthesia Type: General    Diagnosis: 8mm Ureteral Stone, Left Hydronephrosis   Patient Requires:    Cardiac / Vascular Clearance : Yes   Reason: Patient will need to hold Plavix prior to surgery (Patient reports that he has been holding this for last few days due to hematuria)       Risk Assessment:     Low   []       Moderate   []     High   []                 This patient is optimized for surgery  YES []       NO   []      I recommend further assessment/workup prior to surgery. YES []      NO  []    Appointment scheduled for: _______________________    Further recommendations: ____________________________________        Physician Signature:__________________________________    Printed  Name: ________________________________________    Date: _________________

## 2023-05-22 NOTE — Telephone Encounter (Signed)
S/w the pt and his wife and pt has been added on 05/25/23 per Robin Searing, NP due to urgent procedure 05/26/23. Pt has been holding ASA and Plavix see previous notes.      Med rec and consent are done.

## 2023-05-22 NOTE — Progress Notes (Signed)
   [facility] Surgical Posting Form  Surgery Date: Date: 05/26/2023  Surgeon: Dr. Irineo Axon, MD  Inpt ( No  )   Outpt (Yes)   Obs ( No  )   Diagnosis: N20.1 Left Ureteral Stone, N13.30 Left Hydronephrosis  -CPT: [phone]  Surgery: Left Ureteroscopy with Laser Lithotripsy and Stent Placement  Stop Anticoagulations: Yes, will need to hold Plavix and ASA  Cardiac/Medical/Pulmonary Clearance needed: yes  Clearance needed from Dr: [facility],   Clearance request sent on: Date: 05/22/23   *Orders entered into EPIC  Date: 05/22/23   *Case booked in EPIC  Date: 05/22/23  *Notified pt of Surgery: Date: 05/22/23  PRE-OP UA & CX: no  *Placed into Prior Authorization Work [place] Date: 05/22/23  Assistant/laser/rep:No

## 2023-05-25 ENCOUNTER — Ambulatory Visit: Payer: PPO | Attending: Cardiovascular Disease | Admitting: Student

## 2023-05-25 ENCOUNTER — Ambulatory Visit: Payer: PPO | Admitting: Urology

## 2023-05-25 ENCOUNTER — Other Ambulatory Visit: Payer: Self-pay

## 2023-05-25 ENCOUNTER — Encounter: Payer: Self-pay | Admitting: Urology

## 2023-05-25 ENCOUNTER — Encounter
Admission: RE | Admit: 2023-05-25 | Discharge: 2023-05-25 | Disposition: A | Discharge status: Home / Self Care | Payer: PPO | Source: Ambulatory Visit | Attending: Urology | Admitting: Urology

## 2023-05-25 VITALS — BP 156/80 | HR 60 | Ht 73.0 in | Wt 228.0 lb

## 2023-05-25 DIAGNOSIS — Z01810 Encounter for preprocedural cardiovascular examination: Secondary | ICD-10-CM | POA: Diagnosis not present

## 2023-05-25 DIAGNOSIS — N200 Calculus of kidney: Secondary | ICD-10-CM

## 2023-05-25 DIAGNOSIS — N201 Calculus of ureter: Secondary | ICD-10-CM | POA: Diagnosis not present

## 2023-05-25 DIAGNOSIS — N23 Unspecified renal colic: Secondary | ICD-10-CM

## 2023-05-25 DIAGNOSIS — N132 Hydronephrosis with renal and ureteral calculous obstruction: Secondary | ICD-10-CM

## 2023-05-25 NOTE — Progress Notes (Signed)
  Phone Number: [phone] for Surgical Coordinator Fax Number: [phone]   REQUEST FOR SURGICAL CLEARANCE                                           Date: Date: 05/25/23   Faxed to: [facility] Cards/ Dr. Darrold Junker, MD   Surgeon: Dr. Irineo Axon, MD   Date of Surgery: 05/26/2023- Urgent Add on   Operation: Left Ureteroscopy with Laser Lithotripsy and Stent Placement   Anesthesia Type: General    Diagnosis: 8mm Ureteral Stone, Left Hydronephrosis   Patient Requires:    Cardiac / Vascular Clearance : Yes   Reason: Patient will need to hold Plavix prior to surgery (Patient reports that he has been holding this for last week due to Hematuria, He has also been holding Aspirin)       Risk Assessment:     Low   []       Moderate   []     High   []                 This patient is optimized for surgery  YES []       NO   []      I recommend further assessment/workup prior to surgery. YES []      NO  []    Appointment scheduled for: _______________________    Further recommendations: ____________________________________        Physician Signature:__________________________________    Printed Name: ________________________________________    Date: _________________

## 2023-05-25 NOTE — Progress Notes (Signed)
Virtual Visit via Telephone Note   Because of Jeffrey Buchanan's co-morbid illnesses, he is at least at moderate risk for complications without adequate follow up.  This format is felt to be most appropriate for this patient at this time.  The patient did not have access to video technology/had technical difficulties with video requiring transitioning to audio format only (telephone).  All issues noted in this document were discussed and addressed.  No physical exam could be performed with this format.  Please refer to the patient's chart for his consent to telehealth for [facility].  Evaluation Performed:  Preoperative cardiovascular risk assessment _____________   Date:  05/25/2023   Patient ID:  Jeffrey EGGEBRECHT, DOB 1955-10-08, MRN [id number] Patient Location:  Home Provider location:   Office  [facility] Providers Cardiologist:  Marcina Millard, MD Electrophysiologist:  Lanier Prude, MD }   Chief Complaint / Patient Profile   68 y.o. y/o male with a h/o CAD s/p PCI January 2005 followed by [facility] cardiology, HFrEF, LBBB, frequent PVCs on amiodarone, hypertension, hyperlipidemia who is pending left ureteroscopy with laser lithotripsy and stent placement by Dr. Lonna Cobb on 05/26/2023 and presents today for telephonic preoperative cardiovascular risk assessment.  History of Present Illness    Jeffrey Buchanan is a 68 y.o. male who presents via audio/video conferencing for a telehealth visit today.  Pt was last seen in cardiology clinic on 12/25/2022 by Sherie Don, NP.  At that time Jeffrey Buchanan was stable from a cardiac standpoint.  The patient is now pending procedure as outlined above. Since his last visit, he is doing well.Patient denies shortness of breath, dyspnea on exertion, lower extremity edema, orthopnea or PND. No chest pain, pressure, or tightness. No palpitations.  He does not follow a formal exercise program but he is active at home. He  has 4 flights of stairs that he walks up and down several times a day without difficulty.   Past Medical History    Past Medical History:  Diagnosis Date   Acute MI, inferior wall (HCC) 04/06/2003   a.) PCI 04/10/2003 --> 90% pRCA --> 3.5 x 18 mm and 4.0 x 23 mm Vision stents to RCA.   Anxiety    Arthritis    BPH (benign prostatic hyperplasia)    Chronic anticoagulation    a.) DAPT therapy (ASA + clopidogrel)   Chronic back pain    Coronary artery disease 04/10/2003   a.) LHC 04/10/2003 --> EF 40%; 90% pRCA, 40% x 2 mRCA, 30% and 20% pLCx; 3.5 x 18 mm and 4.0 x 23 mm Vision stents to RCA.   DJD (degenerative joint disease)    GERD (gastroesophageal reflux disease)    Headache    History of adenomatous polyp of colon    S/P  PARTIAL COLECTOMY 2006   History of kidney stones    HLD (hyperlipidemia)    Hyperlipidemia    Hypertension    Renal disorder    Skin cancer    Urothelial carcinoma of bladder (HCC) 09/14/2012   a.) Bx 09/14/2012 --> pathology (+) for low grade papillary urothelial carcinoma.   Vitamin D deficiency    Past Surgical History:  Procedure Laterality Date   COLONOSCOPY WITH PROPOFOL N/A 03/04/2017   Procedure: COLONOSCOPY WITH PROPOFOL;  Surgeon: Toledo, Boykin Nearing, MD;  Location: [facility];  Service: Gastroenterology;  Laterality: N/A;   CORONARY ANGIOPLASTY WITH STENT PLACEMENT Left 04/10/2003   Procedure: CORONARY ANGIOPLASTY WITH STENT PLACEMENT (3.5  x 18 mm and 4.0 x 23 mm Vision stents to RCA); Location: [facility]; Surgeon: Arnoldo Hooker, MD   CYSTOSCOPY W/ RETROGRADES Bilateral 09/26/2013   Procedure: CYSTOSCOPY WITH BILATERAL RETROGRADE PYELOGRAM;  Surgeon: Magdalene Molly, MD;  Location: [facility];  Service: Urology;  Laterality: Bilateral;   CYSTOSCOPY WITH LITHOLAPAXY N/A 06/13/2019   Procedure: CYSTOSCOPY;  Surgeon: Vanna Scotland, MD;  Location: [facility];  Service: Urology;  Laterality: N/A;   CYSTOSCOPY WITH LITHOLAPAXY N/A  01/28/2021   Procedure: CYSTOSCOPY WITH LITHOLAPAXY;  Surgeon: Vanna Scotland, MD;  Location: [facility];  Service: Urology;  Laterality: N/A;   CYSTOSCOPY WITH LITHOLAPAXY N/A 12/20/2022   Procedure: CYSTOSCOPY WITH LITHOLAPAXY for Bladder Larina Bras;  Surgeon: Jeffrey Altes, MD;  Location: [facility];  Service: Urology;  Laterality: N/A;   CYSTOSCOPY/URETEROSCOPY/HOLMIUM LASER/STENT PLACEMENT Left 12/20/2022   Procedure: CYSTOSCOPY/URETEROSCOPY/HOLMIUM LASER/STENT PLACEMENT;  Surgeon: Jeffrey Altes, MD;  Location: [facility];  Service: Urology;  Laterality: Left;   ESOPHAGOGASTRODUODENOSCOPY (EGD) WITH PROPOFOL N/A 03/04/2017   Procedure: ESOPHAGOGASTRODUODENOSCOPY (EGD) WITH PROPOFOL;  Surgeon: Toledo, Boykin Nearing, MD;  Location: [facility];  Service: Gastroenterology;  Laterality: N/A;   OPEN REDUCTION SHOULDER DISLOCATION  04/07/1974   PARTIAL COLECTOMY  04/07/2004   STONE EXTRACTION WITH BASKET N/A 06/13/2019   Procedure: STONE EXTRACTION WITH BASKET;  Surgeon: Vanna Scotland, MD;  Location: [facility];  Service: Urology;  Laterality: N/A;   TRANSURETHRAL RESECTION OF BLADDER TUMOR N/A 2014   TRANSURETHRAL RESECTION OF BLADDER TUMOR WITH GYRUS (TURBT-GYRUS) N/A 09/26/2013   Procedure: BLADDER BIOPSY;  Surgeon: Magdalene Molly, MD;  Location: [facility];  Service: Urology;  Laterality: N/A;    Allergies  Allergies  Allergen Reactions   Cephalosporins Diarrhea   Escitalopram Oxalate Other (See Comments)    lethargic    Home Medications    Prior to Admission medications   Medication Sig Start Date End Date Taking? Authorizing Provider  ALPRAZolam Prudy Feeler) 0.5 MG tablet Take 0.5 mg by mouth 2 (two) times daily as needed for sleep or anxiety.    [provider]  amiodarone (PACERONE) 200 MG tablet Take 200 mg by mouth at bedtime.    [provider]  aspirin EC 81 MG tablet Take 81 mg by mouth at bedtime.     [provider]  Cholecalciferol  (VITAMIN D3) 2000 UNITS TABS Take 2,000 Units by mouth daily.    [provider]  clopidogrel (PLAVIX) 75 MG tablet Take 75 mg by mouth at bedtime.  01/29/16   [provider]  docusate sodium (COLACE) 100 MG capsule Take 100 mg by mouth 2 (two) times daily as needed for mild constipation or moderate constipation.    [provider]  finasteride (PROSCAR) 5 MG tablet TAKE 1 TABLET BY MOUTH EVERY DAY 02/11/23   Vanna Scotland, MD  HYDROcodone-acetaminophen (NORCO/VICODIN) 5-325 MG tablet Take 1 tablet by mouth every 6 (six) hours as needed for moderate pain.    [provider]  ketorolac (TORADOL) 10 MG tablet Take 1 tablet (10 mg total) by mouth every 6 (six) hours as needed. 05/21/23   Fisher, Roselyn Bering, PA-C  metoprolol succinate (TOPROL-XL) 50 MG 24 hr tablet Take 50 mg by mouth every evening. Take with or immediately following a meal.    [provider]  oxyCODONE-acetaminophen (PERCOCET) 5-325 MG tablet Take 1 tablet by mouth every 4 (four) hours as needed for severe pain (pain score 7-10). 05/21/23 05/20/24  Sherrie Mustache Roselyn Bering, PA-C  Potassium Citrate 15 MEQ (1620 MG) TBCR Take 1 tablet by mouth in the morning and at bedtime. 05/05/23   Stoioff, Verna Czech, MD  rosuvastatin (CRESTOR) 40 MG tablet Take 40 mg by mouth at bedtime. 04/25/19   [provider]  tamsulosin (FLOMAX) 0.4 MG CAPS capsule Take 2 capsules (0.8 mg total) by mouth at bedtime. 11/18/22   Vanna Scotland, MD    Physical Exam    Vital Signs:  [name] does not have vital signs available for review today.  Given telephonic nature of communication, physical exam is limited. AAOx3. NAD. Normal affect.  Speech and respirations are unlabored.   Assessment & Plan    [facility] Providers Cardiologist:  Marcina Millard, MD Electrophysiologist:  Lanier Prude, MD   Preoperative cardiovascular risk assessment.  Left ureteroscopy with laser lithotripsy and stent  placement by Dr. Lonna Cobb on 05/26/2023.  Chart reviewed as part of pre-operative protocol coverage. According to the RCRI, patient has a 0.9% risk of MACE. Patient reports activity equivalent to 4.0 METS (walks up and down 4 flights of stairs several times a day).   Given past medical history and time since last visit, based on ACC/AHA guidelines, CARMERON HEADY would be at acceptable risk for the planned procedure without further cardiovascular testing.   Patient was advised that if he develops new symptoms prior to surgery to contact our office to arrange a follow-up appointment.  he verbalized understanding.  Aspirin and Plavix prescribed by [facility] cardiology therefore recommendations for holding deferred to prescribing provider.  It appears patient has been holding Plavix secondary to hematuria.  I will route this recommendation to the requesting party via Epic fax function.  Please call with questions.  Time:   Today, I have spent 5 minutes with the patient with telehealth technology discussing medical history, symptoms, and management plan.     Carlos Levering, NP  05/25/2023, 8:51 AM

## 2023-05-25 NOTE — Progress Notes (Signed)
I, [name], acting as a scribe for [name], MD., have documented all relevant documentation on the behalf of [name], MD, as directed by [name], MD while in the presence of [name], MD.  05/25/2023 4:54 PM   [name] [birth year] [id number]  Referring provider: Lynnea Ferrier, MD [address] [facility] [place],  [place] [address]  Chief Complaint  Patient presents with   Nephrolithiasis    HPI: Jeffrey Buchanan is a 68 y.o. male presents with recurrent stone disease.   He most recently was seen by me in September 2024 and underwent crystal lithography and left a ureteroscopy with laser lithotripsy/stone removal.  ED visit 05/21/2023 for severe left lower quadrant abdominal pain and hematuria. Renal stone CT showed bilateral nephrolithiasis with a 6 x 8 mm left proximal ureteral calculus with mild hydronephrosis  He also has non-obstructing renal calculi bilaterally. His pain was controlled with parenteral analgesics and was discharged with recommendations of urology follow-up.  He is tentatively scheduled tomorrow for left ureteroscopy, laser lithotripsy, and stone removal.  Since discharge, he has been minimally symptomatic with mild left lower quadrant abdominal pain.   PMH: Past Medical History:  Diagnosis Date   Acute MI, inferior wall (HCC) 04/06/2003   a.) PCI 04/10/2003 --> 90% pRCA --> 3.5 x 18 mm and 4.0 x 23 mm Vision stents to RCA.   Acute pyelonephritis 12/19/2022   Anxiety    Arthritis    BPH (benign prostatic hyperplasia)    Chronic anticoagulation    a.) DAPT therapy (ASA + clopidogrel)   Chronic back pain    Coronary artery disease 04/10/2003   a.) LHC 04/10/2003 --> EF 40%; 90% pRCA, 40% x 2 mRCA, 30% and 20% pLCx; 3.5 x 18 mm and 4.0 x 23 mm Vision stents to RCA.   DJD (degenerative joint disease)    GERD (gastroesophageal reflux disease)    Headache    History of adenomatous polyp of  colon    S/P  PARTIAL COLECTOMY 2006   History of kidney stones    HLD (hyperlipidemia)    Hyperlipidemia    Hypertension    Left ureteral stone 12/19/2022   MLH1-related Lynch syndrome (HNPCC2) 12/18/2022   Renal disorder    Skin cancer    Urothelial carcinoma of bladder (HCC) 09/14/2012   a.) Bx 09/14/2012 --> pathology (+) for low grade papillary urothelial carcinoma.   Vitamin D deficiency     Surgical History: Past Surgical History:  Procedure Laterality Date   COLONOSCOPY WITH PROPOFOL N/A 03/04/2017   Procedure: COLONOSCOPY WITH PROPOFOL;  Surgeon: Toledo, Boykin Nearing, MD;  Location: [facility];  Service: Gastroenterology;  Laterality: N/A;   CORONARY ANGIOPLASTY WITH STENT PLACEMENT Left 04/10/2003   Procedure: CORONARY ANGIOPLASTY WITH STENT PLACEMENT (3.5 x 18 mm and 4.0 x 23 mm Vision stents to RCA); Location: [facility]; Surgeon: Arnoldo Hooker, MD   CYSTOSCOPY W/ RETROGRADES Bilateral 09/26/2013   Procedure: CYSTOSCOPY WITH BILATERAL RETROGRADE PYELOGRAM;  Surgeon: Magdalene Molly, MD;  Location: [facility];  Service: Urology;  Laterality: Bilateral;   CYSTOSCOPY WITH LITHOLAPAXY N/A 06/13/2019   Procedure: CYSTOSCOPY;  Surgeon: Vanna Scotland, MD;  Location: [facility];  Service: Urology;  Laterality: N/A;   CYSTOSCOPY WITH LITHOLAPAXY N/A 01/28/2021   Procedure: CYSTOSCOPY WITH LITHOLAPAXY;  Surgeon: Vanna Scotland, MD;  Location: [facility];  Service: Urology;  Laterality: N/A;   CYSTOSCOPY WITH LITHOLAPAXY N/A  12/20/2022   Procedure: CYSTOSCOPY WITH LITHOLAPAXY for Bladder Larina Bras;  Surgeon: [name], MD;  Location: [facility];  Service: Urology;  Laterality: N/A;   CYSTOSCOPY/URETEROSCOPY/HOLMIUM LASER/STENT PLACEMENT Left 12/20/2022   Procedure: CYSTOSCOPY/URETEROSCOPY/HOLMIUM LASER/STENT PLACEMENT;  Surgeon: [name], MD;  Location: [facility];  Service: Urology;  Laterality: Left;   ESOPHAGOGASTRODUODENOSCOPY (EGD) WITH PROPOFOL N/A 03/04/2017    Procedure: ESOPHAGOGASTRODUODENOSCOPY (EGD) WITH PROPOFOL;  Surgeon: Toledo, Boykin Nearing, MD;  Location: [facility];  Service: Gastroenterology;  Laterality: N/A;   OPEN REDUCTION SHOULDER DISLOCATION  04/07/1974   PARTIAL COLECTOMY  04/07/2004   STONE EXTRACTION WITH BASKET N/A 06/13/2019   Procedure: STONE EXTRACTION WITH BASKET;  Surgeon: Vanna Scotland, MD;  Location: [facility];  Service: Urology;  Laterality: N/A;   TRANSURETHRAL RESECTION OF BLADDER TUMOR N/A 2014   TRANSURETHRAL RESECTION OF BLADDER TUMOR WITH GYRUS (TURBT-GYRUS) N/A 09/26/2013   Procedure: BLADDER BIOPSY;  Surgeon: Magdalene Molly, MD;  Location: [facility];  Service: Urology;  Laterality: N/A;    Home Medications:  Allergies as of 05/25/2023       Reactions   Cephalosporins Diarrhea   Escitalopram Oxalate Other (See Comments)   lethargic        Medication List        Accurate as of May 25, 2023  4:54 PM. If you have any questions, ask your nurse or doctor.          ALPRAZolam 0.5 MG tablet Commonly known as: XANAX Take 0.5 mg by mouth 2 (two) times daily as needed for sleep or anxiety.   amiodarone 200 MG tablet Commonly known as: PACERONE Take 200 mg by mouth at bedtime.   aspirin EC 81 MG tablet Take 81 mg by mouth at bedtime.   clopidogrel 75 MG tablet Commonly known as: PLAVIX Take 75 mg by mouth at bedtime.   docusate sodium 100 MG capsule Commonly known as: COLACE Take 100 mg by mouth 2 (two) times daily as needed for mild constipation or moderate constipation.   finasteride 5 MG tablet Commonly known as: PROSCAR TAKE 1 TABLET BY MOUTH EVERY DAY   hydrochlorothiazide 12.5 MG capsule Commonly known as: MICROZIDE Take 12.5 mg by mouth daily.   HYDROcodone-acetaminophen 5-325 MG tablet Commonly known as: NORCO/VICODIN Take 1 tablet by mouth every 6 (six) hours as needed for moderate pain.   ketorolac 10 MG tablet Commonly known as: TORADOL Take 1  tablet (10 mg total) by mouth every 6 (six) hours as needed.   lisinopril 10 MG tablet Commonly known as: ZESTRIL Take 10 mg by mouth daily.   metoprolol succinate 50 MG 24 hr tablet Commonly known as: TOPROL-XL Take 50 mg by mouth every evening. Take with or immediately following a meal.   oxyCODONE-acetaminophen 5-325 MG tablet Commonly known as: Percocet Take 1 tablet by mouth every 4 (four) hours as needed for severe pain (pain score 7-10).   Potassium Citrate 15 MEQ (1620 MG) Tbcr Take 1 tablet by mouth in the morning and at bedtime.   rosuvastatin 40 MG tablet Commonly known as: CRESTOR Take 40 mg by mouth at bedtime.   tamsulosin 0.4 MG Caps capsule Commonly known as: FLOMAX Take 2 capsules (0.8 mg total) by mouth at bedtime.   Vitamin D3 50 MCG (2000 UT) Tabs Take 2,000 Units by mouth daily.        Allergies:  Allergies  Allergen Reactions   Cephalosporins Diarrhea   Escitalopram Oxalate Other (See Comments)  lethargic    Family History: Family History  Problem Relation Age of Onset   Bladder Cancer Mother        dx 95s   Prostate cancer Brother        dx 66s   Cancer Maternal Grandmother        unk type   Kidney disease Neg Hx     Social History:  reports that he has been smoking cigarettes. He has a 32 pack-year smoking history. He has never used smokeless tobacco. He reports that he does not drink alcohol and does not use drugs.   Physical Exam: BP (!) 156/80   Pulse 60   Ht 6\' 1"  (1.854 m)   Wt 228 lb (103.4 kg)   BMI 30.08 kg/m   Constitutional:  Alert and oriented, No acute distress. HEENT: [place] AT Cardiovascular: No clubbing, cyanosis, or edema. Respiratory: Normal respiratory effort, no increased work of breathing. Psychiatric: Normal mood and affect.   Pertinent Imaging: CT was personally reviewed and interpreted.   CT Renal Stone Study  Narrative CLINICAL DATA:  Abdominal/flank pain, stone suspected  EXAM: CT ABDOMEN AND  PELVIS WITHOUT CONTRAST  TECHNIQUE: Multidetector CT imaging of the abdomen and pelvis was performed following the standard protocol without IV contrast.  RADIATION DOSE REDUCTION: This exam was performed according to the departmental dose-optimization program which includes automated exposure control, adjustment of the mA and/or kV according to patient size and/or use of iterative reconstruction technique.  COMPARISON:  12/19/2022  FINDINGS: Lower chest: No acute abnormality.  Hepatobiliary: Small cyst at the inferior right hepatic lobe. Otherwise unremarkable unenhanced appearance of the liver. Contracted gallbladder. No hyperdense gallstone or inflammatory changes.  Pancreas: Unremarkable. No pancreatic ductal dilatation or surrounding inflammatory changes.  Spleen: Normal in size without focal abnormality.  Adrenals/Urinary Tract: Unremarkable adrenal glands. Multiple bilateral renal calculi. 8 x 6 mm stone at the left ureteropelvic junction contributing to mild left hydronephrosis. Staghorn calculus at the lower pole of the right kidney. Stable right renal cyst which does not require follow-up imaging. No ureteral calculi. Urinary bladder within normal limits.  Stomach/Bowel: Stomach within normal limits. No evidence of bowel obstruction or active bowel inflammation.  Vascular/Lymphatic: Aortic atherosclerosis. Stable 3.0 cm infrarenal abdominal aortic aneurysm. No enlarged abdominal or pelvic lymph nodes.  Reproductive: Prostatomegaly.  Other: No free fluid. No abdominopelvic fluid collection. No pneumoperitoneum. No abdominal wall hernia.  Musculoskeletal: No acute or significant osseous findings.  IMPRESSION: 1. Bilateral nephrolithiasis with 8 x 6 mm stone at the left ureteropelvic junction contributing to mild left hydronephrosis. 2. Stable 3.0 cm infrarenal abdominal aortic aneurysm. Recommend follow-up every 3 years. 3. Prostatomegaly. 4. Aortic  atherosclerosis (ICD10-I70.0).   Electronically Signed By: Duanne Guess D.O. On: 05/21/2023 15:50   Assessment & Plan:    1. Left proximal ureteral calculus We discussed various treatment options for urolithiasis including observation with or without medical expulsive therapy, shockwave lithotripsy (SWL), ureteroscopy and laser lithotripsy with stent placement. We discussed that management is based on stone size, location, density, patient co-morbidities, and patient preference.  Stones <46mm in size have a >80% spontaneous passage rate. Data surrounding the use of tamsulosin for medical expulsive therapy is controversial, but meta analyses suggests it is most efficacious for distal stones between 5-54mm in size. Possible side effects include dizziness/lightheadedness, and retrograde ejaculation. SWL has a lower stone free rate in a single procedure, but also a lower complication rate compared to ureteroscopy and avoids a stent and associated stent  related symptoms. Possible complications include renal hematoma, steinstrasse, and need for additional treatment. Ureteroscopy with laser lithotripsy and stent placement has a higher stone free rate than SWL in a single procedure, however increased complication rate including possible infection, ureteral injury, bleeding, and stent related morbidity. Common stent related symptoms include dysuria, urgency/frequency, and flank pain. After an extensive discussion of the risks and benefits of the above treatment options, the patient would like to proceed with left ureteroscopy. He does have non-obstructing left renal calculi and will attempt treatment of these stones if able to access the kidney with ureteroscope We discussed in a small percentage of cases, the stone cannot be treated due to inability to access the proximal ureter with a ureteroscope. If this were to occur, a stent would be placed, and he would need follow-up ureteroscopy after a passive  stent dilation versus shockwave lithotripsy.  I have reviewed the above documentation for accuracy and completeness, and I agree with the above.   [name], MD  [facility] [address] [place], [place] [address] [phone]

## 2023-05-25 NOTE — H&P (View-Only) (Signed)
I, Jeffrey Buchanan, acting as a scribe for Jeffrey Altes, MD., have documented all relevant documentation on the behalf of Jeffrey Altes, MD, as directed by Jeffrey Altes, MD while in the presence of Jeffrey Altes, MD.  05/25/2023 4:54 PM   [name] February 19, 1956 [id number]  Referring provider: Lynnea Ferrier, MD [address] [facility] [place],  [place] [address]  Chief Complaint  Patient presents with   Nephrolithiasis    HPI: Jeffrey Buchanan is a 68 y.o. male presents with recurrent stone disease.   He most recently was seen by me in September 2024 and underwent crystal lithography and left a ureteroscopy with laser lithotripsy/stone removal.  ED visit 05/21/2023 for severe left lower quadrant abdominal pain and hematuria. Renal stone CT showed bilateral nephrolithiasis with a 6 x 8 mm left proximal ureteral calculus with mild hydronephrosis  He also has non-obstructing renal calculi bilaterally. His pain was controlled with parenteral analgesics and was discharged with recommendations of urology follow-up.  He is tentatively scheduled tomorrow for left ureteroscopy, laser lithotripsy, and stone removal.  Since discharge, he has been minimally symptomatic with mild left lower quadrant abdominal pain.   PMH: Past Medical History:  Diagnosis Date   Acute MI, inferior wall (HCC) 04/06/2003   a.) PCI 04/10/2003 --> 90% pRCA --> 3.5 x 18 mm and 4.0 x 23 mm Vision stents to RCA.   Acute pyelonephritis 12/19/2022   Anxiety    Arthritis    BPH (benign prostatic hyperplasia)    Chronic anticoagulation    a.) DAPT therapy (ASA + clopidogrel)   Chronic back pain    Coronary artery disease 04/10/2003   a.) LHC 04/10/2003 --> EF 40%; 90% pRCA, 40% x 2 mRCA, 30% and 20% pLCx; 3.5 x 18 mm and 4.0 x 23 mm Vision stents to RCA.   DJD (degenerative joint disease)    GERD (gastroesophageal reflux disease)    Headache    History of adenomatous polyp of  colon    S/P  PARTIAL COLECTOMY 2006   History of kidney stones    HLD (hyperlipidemia)    Hyperlipidemia    Hypertension    Left ureteral stone 12/19/2022   MLH1-related Lynch syndrome (HNPCC2) 12/18/2022   Renal disorder    Skin cancer    Urothelial carcinoma of bladder (HCC) 09/14/2012   a.) Bx 09/14/2012 --> pathology (+) for low grade papillary urothelial carcinoma.   Vitamin D deficiency     Surgical History: Past Surgical History:  Procedure Laterality Date   COLONOSCOPY WITH PROPOFOL N/A 03/04/2017   Procedure: COLONOSCOPY WITH PROPOFOL;  Surgeon: Toledo, Boykin Nearing, MD;  Location: [facility];  Service: Gastroenterology;  Laterality: N/A;   CORONARY ANGIOPLASTY WITH STENT PLACEMENT Left 04/10/2003   Procedure: CORONARY ANGIOPLASTY WITH STENT PLACEMENT (3.5 x 18 mm and 4.0 x 23 mm Vision stents to RCA); Location: [facility]; Surgeon: Arnoldo Hooker, MD   CYSTOSCOPY W/ RETROGRADES Bilateral 09/26/2013   Procedure: CYSTOSCOPY WITH BILATERAL RETROGRADE PYELOGRAM;  Surgeon: Magdalene Molly, MD;  Location: [facility];  Service: Urology;  Laterality: Bilateral;   CYSTOSCOPY WITH LITHOLAPAXY N/A 06/13/2019   Procedure: CYSTOSCOPY;  Surgeon: Vanna Scotland, MD;  Location: [facility];  Service: Urology;  Laterality: N/A;   CYSTOSCOPY WITH LITHOLAPAXY N/A 01/28/2021   Procedure: CYSTOSCOPY WITH LITHOLAPAXY;  Surgeon: Vanna Scotland, MD;  Location: [facility];  Service: Urology;  Laterality: N/A;   CYSTOSCOPY WITH LITHOLAPAXY N/A  12/20/2022   Procedure: CYSTOSCOPY WITH LITHOLAPAXY for Bladder Larina Bras;  Surgeon: Jeffrey Altes, MD;  Location: [facility];  Service: Urology;  Laterality: N/A;   CYSTOSCOPY/URETEROSCOPY/HOLMIUM LASER/STENT PLACEMENT Left 12/20/2022   Procedure: CYSTOSCOPY/URETEROSCOPY/HOLMIUM LASER/STENT PLACEMENT;  Surgeon: Jeffrey Altes, MD;  Location: [facility];  Service: Urology;  Laterality: Left;   ESOPHAGOGASTRODUODENOSCOPY (EGD) WITH PROPOFOL N/A 03/04/2017    Procedure: ESOPHAGOGASTRODUODENOSCOPY (EGD) WITH PROPOFOL;  Surgeon: Toledo, Boykin Nearing, MD;  Location: [facility];  Service: Gastroenterology;  Laterality: N/A;   OPEN REDUCTION SHOULDER DISLOCATION  04/07/1974   PARTIAL COLECTOMY  04/07/2004   STONE EXTRACTION WITH BASKET N/A 06/13/2019   Procedure: STONE EXTRACTION WITH BASKET;  Surgeon: Vanna Scotland, MD;  Location: [facility];  Service: Urology;  Laterality: N/A;   TRANSURETHRAL RESECTION OF BLADDER TUMOR N/A 2014   TRANSURETHRAL RESECTION OF BLADDER TUMOR WITH GYRUS (TURBT-GYRUS) N/A 09/26/2013   Procedure: BLADDER BIOPSY;  Surgeon: Magdalene Molly, MD;  Location: [facility];  Service: Urology;  Laterality: N/A;    Home Medications:  Allergies as of 05/25/2023       Reactions   Cephalosporins Diarrhea   Escitalopram Oxalate Other (See Comments)   lethargic        Medication List        Accurate as of May 25, 2023  4:54 PM. If you have any questions, ask your nurse or doctor.          ALPRAZolam 0.5 MG tablet Commonly known as: XANAX Take 0.5 mg by mouth 2 (two) times daily as needed for sleep or anxiety.   amiodarone 200 MG tablet Commonly known as: PACERONE Take 200 mg by mouth at bedtime.   aspirin EC 81 MG tablet Take 81 mg by mouth at bedtime.   clopidogrel 75 MG tablet Commonly known as: PLAVIX Take 75 mg by mouth at bedtime.   docusate sodium 100 MG capsule Commonly known as: COLACE Take 100 mg by mouth 2 (two) times daily as needed for mild constipation or moderate constipation.   finasteride 5 MG tablet Commonly known as: PROSCAR TAKE 1 TABLET BY MOUTH EVERY DAY   hydrochlorothiazide 12.5 MG capsule Commonly known as: MICROZIDE Take 12.5 mg by mouth daily.   HYDROcodone-acetaminophen 5-325 MG tablet Commonly known as: NORCO/VICODIN Take 1 tablet by mouth every 6 (six) hours as needed for moderate pain.   ketorolac 10 MG tablet Commonly known as: TORADOL Take 1  tablet (10 mg total) by mouth every 6 (six) hours as needed.   lisinopril 10 MG tablet Commonly known as: ZESTRIL Take 10 mg by mouth daily.   metoprolol succinate 50 MG 24 hr tablet Commonly known as: TOPROL-XL Take 50 mg by mouth every evening. Take with or immediately following a meal.   oxyCODONE-acetaminophen 5-325 MG tablet Commonly known as: Percocet Take 1 tablet by mouth every 4 (four) hours as needed for severe pain (pain score 7-10).   Potassium Citrate 15 MEQ (1620 MG) Tbcr Take 1 tablet by mouth in the morning and at bedtime.   rosuvastatin 40 MG tablet Commonly known as: CRESTOR Take 40 mg by mouth at bedtime.   tamsulosin 0.4 MG Caps capsule Commonly known as: FLOMAX Take 2 capsules (0.8 mg total) by mouth at bedtime.   Vitamin D3 50 MCG (2000 UT) Tabs Take 2,000 Units by mouth daily.        Allergies:  Allergies  Allergen Reactions   Cephalosporins Diarrhea   Escitalopram Oxalate Other (See Comments)  lethargic    Family History: Family History  Problem Relation Age of Onset   Bladder Cancer Mother        dx 52s   Prostate cancer Brother        dx 93s   Cancer Maternal Grandmother        unk type   Kidney disease Neg Hx     Social History:  reports that he has been smoking cigarettes. He has a 32 pack-year smoking history. He has never used smokeless tobacco. He reports that he does not drink alcohol and does not use drugs.   Physical Exam: BP (!) 156/80   Pulse 60   Ht 6\' 1"  (1.854 m)   Wt 228 lb (103.4 kg)   BMI 30.08 kg/m   Constitutional:  Alert and oriented, No acute distress. HEENT: [place] AT Cardiovascular: No clubbing, cyanosis, or edema. Respiratory: Normal respiratory effort, no increased work of breathing. Psychiatric: Normal mood and affect.   Pertinent Imaging: CT was personally reviewed and interpreted.   CT Renal Stone Study  Narrative CLINICAL DATA:  Abdominal/flank pain, stone suspected  EXAM: CT ABDOMEN AND  PELVIS WITHOUT CONTRAST  TECHNIQUE: Multidetector CT imaging of the abdomen and pelvis was performed following the standard protocol without IV contrast.  RADIATION DOSE REDUCTION: This exam was performed according to the departmental dose-optimization program which includes automated exposure control, adjustment of the mA and/or kV according to patient size and/or use of iterative reconstruction technique.  COMPARISON:  12/19/2022  FINDINGS: Lower chest: No acute abnormality.  Hepatobiliary: Small cyst at the inferior right hepatic lobe. Otherwise unremarkable unenhanced appearance of the liver. Contracted gallbladder. No hyperdense gallstone or inflammatory changes.  Pancreas: Unremarkable. No pancreatic ductal dilatation or surrounding inflammatory changes.  Spleen: Normal in size without focal abnormality.  Adrenals/Urinary Tract: Unremarkable adrenal glands. Multiple bilateral renal calculi. 8 x 6 mm stone at the left ureteropelvic junction contributing to mild left hydronephrosis. Staghorn calculus at the lower pole of the right kidney. Stable right renal cyst which does not require follow-up imaging. No ureteral calculi. Urinary bladder within normal limits.  Stomach/Bowel: Stomach within normal limits. No evidence of bowel obstruction or active bowel inflammation.  Vascular/Lymphatic: Aortic atherosclerosis. Stable 3.0 cm infrarenal abdominal aortic aneurysm. No enlarged abdominal or pelvic lymph nodes.  Reproductive: Prostatomegaly.  Other: No free fluid. No abdominopelvic fluid collection. No pneumoperitoneum. No abdominal wall hernia.  Musculoskeletal: No acute or significant osseous findings.  IMPRESSION: 1. Bilateral nephrolithiasis with 8 x 6 mm stone at the left ureteropelvic junction contributing to mild left hydronephrosis. 2. Stable 3.0 cm infrarenal abdominal aortic aneurysm. Recommend follow-up every 3 years. 3. Prostatomegaly. 4. Aortic  atherosclerosis (ICD10-I70.0).   Electronically Signed By: Duanne Guess D.O. On: 05/21/2023 15:50   Assessment & Plan:    1. Left proximal ureteral calculus We discussed various treatment options for urolithiasis including observation with or without medical expulsive therapy, shockwave lithotripsy (SWL), ureteroscopy and laser lithotripsy with stent placement. We discussed that management is based on stone size, location, density, patient co-morbidities, and patient preference.  Stones <80mm in size have a >80% spontaneous passage rate. Data surrounding the use of tamsulosin for medical expulsive therapy is controversial, but meta analyses suggests it is most efficacious for distal stones between 5-66mm in size. Possible side effects include dizziness/lightheadedness, and retrograde ejaculation. SWL has a lower stone free rate in a single procedure, but also a lower complication rate compared to ureteroscopy and avoids a stent and associated stent  related symptoms. Possible complications include renal hematoma, steinstrasse, and need for additional treatment. Ureteroscopy with laser lithotripsy and stent placement has a higher stone free rate than SWL in a single procedure, however increased complication rate including possible infection, ureteral injury, bleeding, and stent related morbidity. Common stent related symptoms include dysuria, urgency/frequency, and flank pain. After an extensive discussion of the risks and benefits of the above treatment options, the patient would like to proceed with left ureteroscopy. He does have non-obstructing left renal calculi and will attempt treatment of these stones if able to access the kidney with ureteroscope We discussed in a small percentage of cases, the stone cannot be treated due to inability to access the proximal ureter with a ureteroscope. If this were to occur, a stent would be placed, and he would need follow-up ureteroscopy after a passive  stent dilation versus shockwave lithotripsy.  I have reviewed the above documentation for accuracy and completeness, and I agree with the above.   Jeffrey Altes, MD  [facility] [address], [address] [place], [place] [address] [phone]

## 2023-05-25 NOTE — Patient Instructions (Addendum)
Your procedure is scheduled on: Tuesday February 18  Report to the Registration Desk on the 1st floor of the [facility] To find out your arrival time, please call [phone] between 1PM - 3PM on:  Monday February 17  If your arrival time is 6:00 am, do not arrive before that time as the [facility] entrance doors do not open until 6:00 am.  REMEMBER: Instructions that are not followed completely may result in serious medical risk, up to and including death; or upon the discretion of your surgeon and anesthesiologist your surgery may need to be rescheduled.  Do not eat food after midnight the night before surgery.  No gum chewing or hard candies.   One week prior to surgery:  Starting Tuesday February 11  Stop Anti-inflammatories (NSAIDS) such as Advil, Aleve, Ibuprofen, Motrin, Naproxen, Naprosyn and Aspirin based products such as Excedrin, Goody's Powder, BC Powder. Stop ANY OVER THE COUNTER supplements until after surgery. Cholecalciferol (VITAMIN D3)  ketorolac (TORADOL) Potassium Citrate  You may however, continue to take Tylenol if needed for pain up until the day of surgery.  **Follow recommendations regarding stopping blood thinners.** clopidogrel (PLAVIX) continue to hold you Plavix as instructed by Urology  aspirin EC continue to hold aspirin as instructed by Urology  Continue taking all of your other prescription medications up until the day of surgery.  ON THE MORNING OF SURGERY DO NOT TAKE ANY MEDICATIONS  No Alcohol for 24 hours before or after surgery.  No Smoking including e-cigarettes for 24 hours before surgery.  No chewable tobacco products for at least 6 hours before surgery.  No nicotine patches on the day of surgery.  Do not use any "recreational" drugs for at least a week (preferably 2 weeks) before your surgery.  Please be advised that the combination of cocaine and anesthesia may have negative outcomes, up to and including death. If you test positive  for cocaine, your surgery will be cancelled.  On the morning of surgery brush your teeth with toothpaste and water, you may rinse your mouth with mouthwash if you wish. Do not swallow any toothpaste or mouthwash.  Do not wear jewelry, make-up, hairpins, clips or nail polish.  For welded (permanent) jewelry: bracelets, anklets, waist bands, etc.  Please have this removed prior to surgery.  If it is not removed, there is a chance that hospital personnel will need to cut it off on the day of surgery.  Do not wear lotions, powders, or perfumes.   Do not shave body hair from the neck down 48 hours before surgery.  Do not bring valuables to the hospital. [facility] is not responsible for any missing/lost belongings or valuables.   Notify your doctor if there is any change in your medical condition (cold, fever, infection).  Wear comfortable clothing (specific to your surgery type) to the hospital.  After surgery, you can help prevent lung complications by doing breathing exercises.  Take deep breaths and cough every 1-2 hours.  If you are being discharged the day of surgery, you will not be allowed to drive home. You will need a responsible individual to drive you home and stay with you for 24 hours after surgery.   If you are taking public transportation, you will need to have a responsible individual with you.  Please call the Pre-admissions Testing Dept. at [phone] if you have any questions about these instructions.  Surgery Visitation Policy:  Patients having surgery or a procedure may have two visitors.  Children under the age of 71 must have an adult with them who is not the patient.  Temporary Visitor Restrictions Due to increasing cases of flu, RSV and COVID-19: Children ages 91 and under will not be able to visit patients in [facility] hospitals under most circumstances.

## 2023-05-26 ENCOUNTER — Encounter: Payer: Self-pay | Admitting: Urology

## 2023-05-26 ENCOUNTER — Ambulatory Visit
Admission: RE | Admit: 2023-05-26 | Discharge: 2023-05-26 | Disposition: A | Discharge status: Home / Self Care | Payer: PPO | Source: Ambulatory Visit | Attending: Urology | Admitting: Urology

## 2023-05-26 ENCOUNTER — Other Ambulatory Visit: Payer: Self-pay

## 2023-05-26 ENCOUNTER — Ambulatory Visit: Payer: Self-pay | Admitting: Urgent Care

## 2023-05-26 ENCOUNTER — Encounter
Admission: RE | Disposition: A | Discharge status: Home / Self Care | Payer: Self-pay | Source: Ambulatory Visit | Attending: Urology

## 2023-05-26 ENCOUNTER — Ambulatory Visit: Payer: PPO | Admitting: Dermatology

## 2023-05-26 ENCOUNTER — Ambulatory Visit: Payer: PPO

## 2023-05-26 DIAGNOSIS — N400 Enlarged prostate without lower urinary tract symptoms: Secondary | ICD-10-CM | POA: Insufficient documentation

## 2023-05-26 DIAGNOSIS — G8929 Other chronic pain: Secondary | ICD-10-CM | POA: Insufficient documentation

## 2023-05-26 DIAGNOSIS — Z87442 Personal history of urinary calculi: Secondary | ICD-10-CM | POA: Insufficient documentation

## 2023-05-26 DIAGNOSIS — I2510 Atherosclerotic heart disease of native coronary artery without angina pectoris: Secondary | ICD-10-CM | POA: Insufficient documentation

## 2023-05-26 DIAGNOSIS — I110 Hypertensive heart disease with heart failure: Secondary | ICD-10-CM | POA: Diagnosis not present

## 2023-05-26 DIAGNOSIS — F17210 Nicotine dependence, cigarettes, uncomplicated: Secondary | ICD-10-CM | POA: Insufficient documentation

## 2023-05-26 DIAGNOSIS — I10 Essential (primary) hypertension: Secondary | ICD-10-CM | POA: Insufficient documentation

## 2023-05-26 DIAGNOSIS — N200 Calculus of kidney: Secondary | ICD-10-CM

## 2023-05-26 DIAGNOSIS — Z7901 Long term (current) use of anticoagulants: Secondary | ICD-10-CM | POA: Insufficient documentation

## 2023-05-26 DIAGNOSIS — Z8551 Personal history of malignant neoplasm of bladder: Secondary | ICD-10-CM | POA: Diagnosis not present

## 2023-05-26 DIAGNOSIS — N201 Calculus of ureter: Secondary | ICD-10-CM

## 2023-05-26 DIAGNOSIS — I509 Heart failure, unspecified: Secondary | ICD-10-CM | POA: Diagnosis not present

## 2023-05-26 DIAGNOSIS — I700 Atherosclerosis of aorta: Secondary | ICD-10-CM | POA: Insufficient documentation

## 2023-05-26 DIAGNOSIS — I7143 Infrarenal abdominal aortic aneurysm, without rupture: Secondary | ICD-10-CM | POA: Diagnosis not present

## 2023-05-26 DIAGNOSIS — M549 Dorsalgia, unspecified: Secondary | ICD-10-CM | POA: Insufficient documentation

## 2023-05-26 DIAGNOSIS — I252 Old myocardial infarction: Secondary | ICD-10-CM | POA: Insufficient documentation

## 2023-05-26 DIAGNOSIS — K219 Gastro-esophageal reflux disease without esophagitis: Secondary | ICD-10-CM | POA: Insufficient documentation

## 2023-05-26 DIAGNOSIS — N132 Hydronephrosis with renal and ureteral calculous obstruction: Secondary | ICD-10-CM | POA: Diagnosis not present

## 2023-05-26 DIAGNOSIS — N1330 Unspecified hydronephrosis: Secondary | ICD-10-CM

## 2023-05-26 HISTORY — PX: CYSTOSCOPY/URETEROSCOPY/HOLMIUM LASER/STENT PLACEMENT: SHX6546

## 2023-05-26 SURGERY — CYSTOSCOPY/URETEROSCOPY/HOLMIUM LASER/STENT PLACEMENT
Anesthesia: General | Site: Penis | Laterality: Left

## 2023-05-26 MED ORDER — EPHEDRINE SULFATE-NACL 50-0.9 MG/10ML-% IV SOSY
PREFILLED_SYRINGE | INTRAVENOUS | Status: DC | PRN
  Administered 2023-05-26: 10 mg via INTRAVENOUS

## 2023-05-26 MED ORDER — FLUORESCEIN SODIUM 10 % IV SOLN
INTRAVENOUS | Status: AC
  Filled 2023-05-26: qty 5

## 2023-05-26 MED ORDER — CHLORHEXIDINE GLUCONATE 0.12 % MT SOLN
15.0000 mL | Freq: Once | OROMUCOSAL | Status: AC
  Administered 2023-05-26: 15 mL via OROMUCOSAL

## 2023-05-26 MED ORDER — FENTANYL CITRATE (PF) 100 MCG/2ML IJ SOLN: 25.0000 ug | INTRAMUSCULAR | Status: DC | PRN

## 2023-05-26 MED ORDER — DEXAMETHASONE SODIUM PHOSPHATE 10 MG/ML IJ SOLN
INTRAMUSCULAR | Status: AC
  Filled 2023-05-26: qty 1

## 2023-05-26 MED ORDER — LIDOCAINE HCL (CARDIAC) PF 100 MG/5ML IV SOSY
PREFILLED_SYRINGE | INTRAVENOUS | Status: DC | PRN
  Administered 2023-05-26: 100 mg via INTRAVENOUS

## 2023-05-26 MED ORDER — CIPROFLOXACIN IN D5W 400 MG/200ML IV SOLN
400.0000 mg | Freq: Two times a day (BID) | INTRAVENOUS | Status: DC
  Administered 2023-05-26: 400 mg via INTRAVENOUS

## 2023-05-26 MED ORDER — LIDOCAINE HCL (PF) 2 % IJ SOLN
INTRAMUSCULAR | Status: AC
  Filled 2023-05-26: qty 5

## 2023-05-26 MED ORDER — PROPOFOL 10 MG/ML IV BOLUS
INTRAVENOUS | Status: AC
  Filled 2023-05-26: qty 20

## 2023-05-26 MED ORDER — ONDANSETRON HCL 4 MG/2ML IJ SOLN
INTRAMUSCULAR | Status: AC
  Filled 2023-05-26: qty 2

## 2023-05-26 MED ORDER — CEFTRIAXONE SODIUM 1 G IJ SOLR
1.0000 g | INTRAMUSCULAR | Status: DC
  Filled 2023-05-26: qty 10

## 2023-05-26 MED ORDER — CIPROFLOXACIN IN D5W 400 MG/200ML IV SOLN
INTRAVENOUS | Status: AC
  Filled 2023-05-26: qty 200

## 2023-05-26 MED ORDER — OXYCODONE-ACETAMINOPHEN 5-325 MG PO TABS: 1.0000 | ORAL_TABLET | ORAL | 0 refills | Status: DC | PRN

## 2023-05-26 MED ORDER — PROPOFOL 10 MG/ML IV BOLUS
INTRAVENOUS | Status: DC | PRN
  Administered 2023-05-26: 50 ug/kg/min via INTRAVENOUS

## 2023-05-26 MED ORDER — DEXAMETHASONE SODIUM PHOSPHATE 10 MG/ML IJ SOLN
INTRAMUSCULAR | Status: DC | PRN
Start: 2023-05-26 — End: 2023-05-27
  Administered 2023-05-26: 10 mg via INTRAVENOUS

## 2023-05-26 MED ORDER — IOHEXOL 180 MG/ML  SOLN
INTRAMUSCULAR | Status: DC | PRN
  Administered 2023-05-26: 10 mL
  Administered 2023-05-26: 30 mL

## 2023-05-26 MED ORDER — CHLORHEXIDINE GLUCONATE 0.12 % MT SOLN
OROMUCOSAL | Status: AC
  Filled 2023-05-26: qty 15

## 2023-05-26 MED ORDER — SODIUM CHLORIDE 0.9 % IR SOLN
Status: DC | PRN
  Administered 2023-05-26: 2400 mL

## 2023-05-26 MED ORDER — ROCURONIUM BROMIDE 10 MG/ML (PF) SYRINGE
PREFILLED_SYRINGE | INTRAVENOUS | Status: AC
  Filled 2023-05-26: qty 10

## 2023-05-26 MED ORDER — LACTATED RINGERS IV SOLN: INTRAVENOUS | Status: DC

## 2023-05-26 MED ORDER — MIDAZOLAM HCL 2 MG/2ML IJ SOLN
INTRAMUSCULAR | Status: AC
  Filled 2023-05-26: qty 2

## 2023-05-26 MED ORDER — OXYCODONE HCL 5 MG PO TABS: 5.0000 mg | ORAL_TABLET | Freq: Once | ORAL | Status: DC | PRN

## 2023-05-26 MED ORDER — FENTANYL CITRATE (PF) 100 MCG/2ML IJ SOLN
INTRAMUSCULAR | Status: AC
Start: 2023-05-26 — End: ?
  Filled 2023-05-26: qty 2

## 2023-05-26 MED ORDER — FLUORESCEIN SODIUM 10 % IV SOLN
INTRAVENOUS | Status: DC | PRN
  Administered 2023-05-26: 500 mg via INTRAVENOUS

## 2023-05-26 MED ORDER — MIDAZOLAM HCL 2 MG/2ML IJ SOLN
INTRAMUSCULAR | Status: DC | PRN
  Administered 2023-05-26: 2 mg via INTRAVENOUS

## 2023-05-26 MED ORDER — ONDANSETRON HCL 4 MG/2ML IJ SOLN
INTRAMUSCULAR | Status: DC | PRN
  Administered 2023-05-26: 4 mg via INTRAVENOUS

## 2023-05-26 MED ORDER — ROCURONIUM BROMIDE 100 MG/10ML IV SOLN
INTRAVENOUS | Status: DC | PRN
  Administered 2023-05-26: 50 mg via INTRAVENOUS
  Administered 2023-05-26: 20 mg via INTRAVENOUS
  Administered 2023-05-26: 50 mg via INTRAVENOUS
  Administered 2023-05-26: 20 mg via INTRAVENOUS

## 2023-05-26 MED ORDER — ALBUTEROL SULFATE HFA 108 (90 BASE) MCG/ACT IN AERS
INHALATION_SPRAY | RESPIRATORY_TRACT | Status: AC
  Filled 2023-05-26: qty 6.7

## 2023-05-26 MED ORDER — SUGAMMADEX SODIUM 200 MG/2ML IV SOLN
INTRAVENOUS | Status: DC | PRN
  Administered 2023-05-26: 206.8 mg via INTRAVENOUS

## 2023-05-26 MED ORDER — INDOCYANINE GREEN 25 MG IV SOLR
INTRAVENOUS | Status: AC
  Filled 2023-05-26: qty 10

## 2023-05-26 MED ORDER — ACETAMINOPHEN 10 MG/ML IV SOLN
INTRAVENOUS | Status: DC | PRN
  Administered 2023-05-26: 1000 mg via INTRAVENOUS

## 2023-05-26 MED ORDER — SODIUM CHLORIDE 0.9 % IV SOLN: INTRAVENOUS | Status: DC | PRN

## 2023-05-26 MED ORDER — ORAL CARE MOUTH RINSE: 15.0000 mL | Freq: Once | OROMUCOSAL | Status: AC

## 2023-05-26 MED ORDER — ALBUTEROL SULFATE HFA 108 (90 BASE) MCG/ACT IN AERS
INHALATION_SPRAY | RESPIRATORY_TRACT | Status: DC | PRN
  Administered 2023-05-26: 4 via RESPIRATORY_TRACT
  Administered 2023-05-26: 2 via RESPIRATORY_TRACT

## 2023-05-26 MED ORDER — OXYBUTYNIN CHLORIDE 5 MG PO TABS: ORAL_TABLET | ORAL | 0 refills | Status: DC

## 2023-05-26 MED ORDER — EPHEDRINE 5 MG/ML INJ
INTRAVENOUS | Status: AC
  Filled 2023-05-26: qty 5

## 2023-05-26 MED ORDER — FENTANYL CITRATE (PF) 100 MCG/2ML IJ SOLN
INTRAMUSCULAR | Status: DC | PRN
  Administered 2023-05-26 (×2): 50 ug via INTRAVENOUS

## 2023-05-26 MED ORDER — OXYCODONE HCL 5 MG/5ML PO SOLN: 5.0000 mg | Freq: Once | ORAL | Status: DC | PRN

## 2023-05-26 MED ORDER — INDOCYANINE GREEN 25 MG IV SOLR
INTRAVENOUS | Status: DC | PRN
  Administered 2023-05-26: 25 mg via INTRAVENOUS

## 2023-05-26 SURGICAL SUPPLY — 25 items
BAG DRAIN SIEMENS DORNER NS (MISCELLANEOUS) ×1 IMPLANT
BASKET ZERO TIP 1.9FR (BASKET) IMPLANT
CATH URET FLEX-TIP 2 LUMEN 10F (CATHETERS) IMPLANT
CATH URETL OPEN END 6FR 70 (CATHETERS) IMPLANT
CATH URETL OPEN END 6X70 (CATHETERS) IMPLANT
CNTNR URN SCR LID CUP LEK RST (MISCELLANEOUS) IMPLANT
DRAPE UTILITY 15X26 TOWEL STRL (DRAPES) ×1 IMPLANT
FIBER LASER MOSES 200 DFL (Laser) IMPLANT
GLOVE BIOGEL PI IND STRL 7.5 (GLOVE) ×1 IMPLANT
GOWN STRL REUS W/ TWL LRG LVL3 (GOWN DISPOSABLE) ×1 IMPLANT
GOWN STRL REUS W/ TWL XL LVL3 (GOWN DISPOSABLE) ×1 IMPLANT
GUIDEWIRE ANG ZIPWIRE 035X150 (WIRE) IMPLANT
GUIDEWIRE GREEN .038 145CM (MISCELLANEOUS) IMPLANT
GUIDEWIRE STR DUAL SENSOR (WIRE) ×1 IMPLANT
GUIDEWIRE STR ZIPWIRE 035X150 (MISCELLANEOUS) IMPLANT
IV NS IRRIG 3000ML ARTHROMATIC (IV SOLUTION) ×1 IMPLANT
KIT TURNOVER CYSTO (KITS) ×1 IMPLANT
PACK CYSTO AR (MISCELLANEOUS) ×1 IMPLANT
SET CYSTO W/LG BORE CLAMP LF (SET/KITS/TRAYS/PACK) ×1 IMPLANT
SHEATH NAVIGATOR HD 12/14X36 (SHEATH) IMPLANT
STENT URET 6FRX24 CONTOUR (STENTS) IMPLANT
STENT URET 6FRX26 CONTOUR (STENTS) IMPLANT
SURGILUBE 2OZ TUBE FLIPTOP (MISCELLANEOUS) ×1 IMPLANT
VALVE UROSEAL ADJ ENDO (VALVE) IMPLANT
WATER STERILE IRR 500ML POUR (IV SOLUTION) ×1 IMPLANT

## 2023-05-26 NOTE — Transfer of Care (Signed)
Immediate Anesthesia Transfer of Care Note  Patient: Jeffrey Buchanan  Procedure(s) Performed: CYSTOSCOPY/URETEROSCOPY/HOLMIUM LASER/STENT PLACEMENT (Left: Penis)  Patient Location: PACU  Anesthesia Type:General  Level of Consciousness: awake  Airway & Oxygen Therapy: Patient Spontanous Breathing and Patient connected to face mask oxygen  Post-op Assessment: Report given to RN and Post -op Vital signs reviewed and stable  Post vital signs: Reviewed and stable  Last Vitals:  Vitals Value Taken Time  BP 158/80 05/26/23 1326  Temp    Pulse 83 05/26/23 1328  Resp 23 05/26/23 1328  SpO2 97 % 05/26/23 1328  Vitals shown include unfiled device data.  Last Pain:  Vitals:   05/26/23 0940  TempSrc: Temporal  PainSc: 2          Complications: There were no known notable events for this encounter.

## 2023-05-26 NOTE — Anesthesia Procedure Notes (Signed)
Procedure Name: Intubation Date/Time: 05/26/2023 10:41 AM  Performed by: Lysbeth Penner, CRNAPre-anesthesia Checklist: Patient identified, Emergency Drugs available, Suction available and Patient being monitored Patient Re-evaluated:Patient Re-evaluated prior to induction Oxygen Delivery Method: Circle system utilized Preoxygenation: Pre-oxygenation with 100% oxygen Induction Type: IV induction Ventilation: Mask ventilation without difficulty Laryngoscope Size: McGrath and 4 Grade View: Grade II Tube type: Oral Tube size: 7.0 mm Number of attempts: 1 Airway Equipment and Method: Stylet and Oral airway Placement Confirmation: ETT inserted through vocal cords under direct vision, positive ETCO2 and breath sounds checked- equal and bilateral Secured at: 23 cm Tube secured with: Tape Dental Injury: Teeth and Oropharynx as per pre-operative assessment

## 2023-05-26 NOTE — Discharge Instructions (Addendum)
DISCHARGE INSTRUCTIONS FOR KIDNEY STONE/URETERAL STENT   MEDICATIONS:  1. Resume all your other meds from home.  2.  AZO (over-the-counter) can help with the burning/stinging when you urinate. 3.  Prescriptions for oxybutynin and pain medication were sent to your pharmacy.  Oxybutynin will help stent/bladder irritation 4.  Continue tamsulosin which can help with stent irritation   ACTIVITY:  1. May resume regular activities in 24 hours. 2. No driving while on narcotic pain medications  3. Drink plenty of water  4. Continue to walk at home - you can still get blood clots when you are at home, so keep active, but don't over do it.  5. May return to work/school tomorrow or when you feel ready    SIGNS/SYMPTOMS TO CALL:  Common postoperative symptoms include urinary frequency, urgency, bladder spasm and blood in the urine  Please call us if you have a fever greater than 101.5, uncontrolled nausea/vomiting, uncontrolled pain, dizziness, unable to urinate, excessively bloody urine, chest pain, shortness of breath, leg swelling, leg pain, or any other concerns or questions.   You can reach [place] at [phone].   FOLLOW-UP:  1. You will be contacted for a follow-up appointment

## 2023-05-26 NOTE — Op Note (Signed)
Preoperative diagnosis: Left nephrolithiasis   Postoperative diagnosis: Same   Procedure:  Cystoscopy Left ureteroscopy and stone removal Ureteroscopic laser lithotripsy Left ureteral stent placement (40F/26 cm)  Left retrograde pyelography with interpretation  Surgeon: Lorin Picket C. Marck Mcclenny, M.D.  Anesthesia: General  Complications: None  Intraoperative findings:  Cystoscopy: Urethra normal in caliber without stricture.  Prominent lateral lobe enlargement with bladder neck elevation/median lobe.  UOs normal-appearing bilaterally.  Moderate trabeculation without solid or papillary lesions.  Small calcifications in the bladder measuring < 2 mm Ureteropyeloscopy: Distal, mid and proximal ureter normal in appearance.  The following calculi were identified: The 8 mm UPJ calculus migrated to a midpole calyx 10 mm calculus in a posterior midpole calyx A cluster of 5 lower calyceal calculi largest measuring 5 mm  3.  Left retrograde pyelography post procedure showed no filling defects, stone fragments or contrast extravasation  EBL: Minimal  Specimens: None   Indication: Jeffrey Buchanan is a 68 y.o. male with a history of recurrent nephrolithiasis.  Recent ED visit for left renal colic with CT showing a 8 mm obstructing UPJ calculus and additional calculi which were nonobstructing.  After reviewing the management options for treatment, the patient elected to proceed with the above surgical procedure(s). We have discussed the potential benefits and risks of the procedure, side effects of the proposed treatment, the likelihood of the patient achieving the goals of the procedure, and any potential problems that might occur during the procedure or recuperation. Informed consent has been obtained.  Description of procedure:  The patient was taken to the operating room and general anesthesia was induced.  The patient was placed in the dorsal lithotomy position, prepped and draped in the usual  sterile fashion, and preoperative antibiotics were administered. A preoperative time-out was performed.   A 21 French cystoscope was lubricated, placed per urethra and advanced proximally into the bladder under direct vision with findings described above.  Attention was directed to the left ureteral orifice and a 0.038 Sensor wire was unable to be advanced more than a few millimeters into the left UO.  Several wires were attempted including straight and angled zip wires.  The patient was given intravenous for same and efflux was seen from the right UO but none from the left UO.  He did not have significant obstruction on his CT.  After placing a 40F ureteral catheter into the UO a Zip wire was finally able to be advanced into the renal pelvis.  The cystoscope was removed and a dual-lumen catheter was placed over the Zip wire and an Amplatz Super Stiff wire was placed in a similar fashion.  The zip wire was exchanged for a Sensor wire  The dual-lumen catheter was removed and a single channel digital flexible ureteroscope was placed over the Amplatz wire and advanced into the ureter.  Once in the proximal ureter the Amplatz wire was removed and the ureteroscope was advanced into the renal pelvis without difficulty.  Pyeloscopy was then performed with findings as described above.     A 200 m Moses holmium laser fiber was placed through the ureteroscope and the 8 mm calculus was dusted at a setting of 0.3J/40 Hz.  A few fragments that chipped off the calculus during lithotripsy were further treated with noncontact laser lithotripsy until no fragments larger than the tip of the laser fiber were identified.  The 10 mm calculus was treated in a similar fashion.  There was an inner core of stone which was harder  and the settings were changed to 0.3J/120 Hz  with no significant sized fragments identified after dusting.  The cluster of lower calyceal stones were treated with noncontact laser lithotripsy at  0.3J/120 Hz until no significantly sized fragments were identified.  Retrograde pyelogram was performed and each calyx was sequentially examined under fluoroscopic guidance and no significant size fragments were identified.  No extravasation of contrast was noted.  The ureteroscope was removed under direct vision and no ureteral fragments or mucosal abnormalities were identified.  A 6 FR/26 CM stent Contour right ureteral stent was placed under fluoroscopic guidance.  The wire was then removed with an adequate stent curl noted in an upper pole calyx as well as in the bladder.  The bladder was then emptied and the procedure ended.  The patient appeared to tolerate the procedure well and without complications.  After anesthetic reversal the patient was transported to the PACU in stable condition.   Plan: Follow-up KUB 1 week and if no significantly sized fragments did not find he will be scheduled for stent removal  Irineo Axon, MD

## 2023-05-26 NOTE — Interval H&P Note (Signed)
History and Physical Interval Note:  05/26/2023 10:25 AM  Jeffrey Buchanan  has presented today for surgery, with the diagnosis of Left Ureteral Stone, Left Hydronephrosis.  The various methods of treatment have been discussed with the patient and family. After consideration of risks, benefits and other options for treatment, the patient has consented to  Procedure(s): CYSTOSCOPY/URETEROSCOPY/HOLMIUM LASER/STENT PLACEMENT (Left) as a surgical intervention.  The patient's history has been reviewed, patient examined, no change in status, stable for surgery.  I have reviewed the patient's chart and labs.  Questions were answered to the patient's satisfaction.    CV:RRR Lungs:clear  [name]

## 2023-05-26 NOTE — Anesthesia Preprocedure Evaluation (Addendum)
Anesthesia Evaluation  Patient identified by MRN, date of birth, ID band Patient awake    Reviewed: Allergy & Precautions, NPO status , Patient's Chart, lab work & pertinent test results  History of Anesthesia Complications Negative for: history of anesthetic complications  Airway Mallampati: III  TM Distance: <3 FB Neck ROM: full    Dental  (+) Poor Dentition, Chipped, Missing   Pulmonary neg shortness of breath, COPD, Current Smoker and Patient abstained from smoking.   Pulmonary exam normal        Cardiovascular Exercise Tolerance: Good hypertension, + CAD, + Past MI and +CHF  Normal cardiovascular exam     Neuro/Psych  Headaches  negative psych ROS   GI/Hepatic Neg liver ROS,GERD  Controlled,,  Endo/Other  negative endocrine ROS    Renal/GU Renal disease     Musculoskeletal   Abdominal   Peds  Hematology negative hematology ROS (+)   Anesthesia Other Findings Patient has cardiac clearance for this procedure.   Past Medical History: 04/06/2003: Acute MI, inferior wall (HCC)     Comment:  a.) PCI 04/10/2003 --> 90% pRCA --> 3.5 x 18 mm and 4.0               x 23 mm Vision stents to RCA. 12/19/2022: Acute pyelonephritis No date: Anxiety No date: Arthritis No date: BPH (benign prostatic hyperplasia) No date: Chronic anticoagulation     Comment:  a.) DAPT therapy (ASA + clopidogrel) No date: Chronic back pain 04/10/2003: Coronary artery disease     Comment:  a.) LHC 04/10/2003 --> EF 40%; 90% pRCA, 40% x 2 mRCA,               30% and 20% pLCx; 3.5 x 18 mm and 4.0 x 23 mm Vision               stents to RCA. No date: DJD (degenerative joint disease) No date: GERD (gastroesophageal reflux disease) No date: Headache No date: History of adenomatous polyp of colon     Comment:  S/P  PARTIAL COLECTOMY 2006 No date: History of kidney stones No date: HLD (hyperlipidemia) No date: Hyperlipidemia No date:  Hypertension 12/19/2022: Left ureteral stone 12/18/2022: MLH1-related Lynch syndrome (HNPCC2) No date: Renal disorder No date: Skin cancer 09/14/2012: Urothelial carcinoma of bladder (HCC)     Comment:  a.) Bx 09/14/2012 --> pathology (+) for low grade               papillary urothelial carcinoma. No date: Vitamin D deficiency  Past Surgical History: 03/04/2017: COLONOSCOPY WITH PROPOFOL; N/A     Comment:  Procedure: COLONOSCOPY WITH PROPOFOL;  Surgeon: [place],               Boykin Nearing, MD;  Location: [facility];  Service:               Gastroenterology;  Laterality: N/A; 04/10/2003: CORONARY ANGIOPLASTY WITH STENT PLACEMENT; Left     Comment:  Procedure: CORONARY ANGIOPLASTY WITH STENT PLACEMENT               (3.5 x 18 mm and 4.0 x 23 mm Vision stents to RCA);               Location: [facility]; Surgeon: Arnoldo Hooker, MD 09/26/2013: Bluford Kaufmann W/ RETROGRADES; Bilateral     Comment:  Procedure: CYSTOSCOPY WITH BILATERAL RETROGRADE               PYELOGRAM;  Surgeon: Magdalene Molly, MD;  Location:               [facility];  Service: Urology;                Laterality: Bilateral; 06/13/2019: CYSTOSCOPY WITH LITHOLAPAXY; N/A     Comment:  Procedure: CYSTOSCOPY;  Surgeon: Vanna Scotland, MD;                Location: [facility];  Service: Urology;  Laterality: N/A; 01/28/2021: CYSTOSCOPY WITH LITHOLAPAXY; N/A     Comment:  Procedure: CYSTOSCOPY WITH LITHOLAPAXY;  Surgeon:               Vanna Scotland, MD;  Location: [facility];  Service:               Urology;  Laterality: N/A; 12/20/2022: CYSTOSCOPY WITH LITHOLAPAXY; N/A     Comment:  Procedure: CYSTOSCOPY WITH LITHOLAPAXY for Bladder               Larina Bras;  Surgeon: [name], MD;  Location: [facility]               [facility];  Service: Urology;  Laterality: N/A; 12/20/2022: CYSTOSCOPY/URETEROSCOPY/HOLMIUM LASER/STENT PLACEMENT; Left     Comment:  Procedure: CYSTOSCOPY/URETEROSCOPY/HOLMIUM LASER/STENT               PLACEMENT;   Surgeon: [name], MD;  Location:               [facility];  Service: Urology;  Laterality: Left; 03/04/2017: ESOPHAGOGASTRODUODENOSCOPY (EGD) WITH PROPOFOL; N/A     Comment:  Procedure: ESOPHAGOGASTRODUODENOSCOPY (EGD) WITH               PROPOFOL;  Surgeon: Toledo, Boykin Nearing, MD;  Location:               [facility];  Service: Gastroenterology;  Laterality:               N/A; 04/07/1974: OPEN REDUCTION SHOULDER DISLOCATION 04/07/2004: PARTIAL COLECTOMY 06/13/2019: STONE EXTRACTION WITH BASKET; N/A     Comment:  Procedure: STONE EXTRACTION WITH BASKET;  Surgeon:               Vanna Scotland, MD;  Location: [facility];  Service:               Urology;  Laterality: N/A; 2014: TRANSURETHRAL RESECTION OF BLADDER TUMOR; N/A 09/26/2013: TRANSURETHRAL RESECTION OF BLADDER TUMOR WITH GYRUS  (TURBT-GYRUS); N/A     Comment:  Procedure: BLADDER BIOPSY;  Surgeon: Magdalene Molly,               MD;  Location: [facility];  Service:               Urology;  Laterality: N/A;  BMI    Body Mass Index: 29.27 kg/m      Reproductive/Obstetrics negative OB ROS                             Anesthesia Physical Anesthesia Plan  ASA: 4  Anesthesia Plan: General ETT   Post-op Pain Management:    Induction: Intravenous  PONV Risk Score and Plan: Ondansetron, Dexamethasone, Midazolam and Treatment may vary due to age or medical condition  Airway Management Planned: Oral ETT  Additional Equipment:   Intra-op Plan:   Post-operative Plan: Extubation in OR  Informed Consent: I have reviewed the patients History and Physical, chart, labs and discussed  the procedure including the risks, benefits and alternatives for the proposed anesthesia with the patient or authorized representative who has indicated his/her understanding and acceptance.     Dental Advisory Given  Plan Discussed with: Anesthesiologist, CRNA and Surgeon  Anesthesia Plan Comments:  (Patient consented for risks of anesthesia including but not limited to:  - adverse reactions to medications - damage to eyes, teeth, lips or other oral mucosa - nerve damage due to positioning  - sore throat or hoarseness - Damage to heart, brain, nerves, lungs, other parts of body or loss of life  Patient voiced understanding and assent.)       Anesthesia Quick Evaluation

## 2023-05-27 ENCOUNTER — Encounter: Payer: Self-pay | Admitting: Urology

## 2023-05-27 ENCOUNTER — Other Ambulatory Visit: Payer: Self-pay

## 2023-05-27 DIAGNOSIS — N200 Calculus of kidney: Secondary | ICD-10-CM

## 2023-05-27 NOTE — Anesthesia Postprocedure Evaluation (Signed)
Anesthesia Post Note  Patient: Jeffrey Buchanan  Procedure(s) Performed: CYSTOSCOPY/URETEROSCOPY/HOLMIUM LASER/STENT PLACEMENT (Left: Penis)  Patient location during evaluation: PACU Anesthesia Type: General Level of consciousness: awake and alert Pain management: pain level controlled Vital Signs Assessment: post-procedure vital signs reviewed and stable Respiratory status: spontaneous breathing, nonlabored ventilation, respiratory function stable and patient connected to nasal cannula oxygen Cardiovascular status: blood pressure returned to baseline and stable Postop Assessment: no apparent nausea or vomiting Anesthetic complications: no   There were no known notable events for this encounter.   Last Vitals:  Vitals:   05/26/23 1345 05/26/23 1416  BP: (!) 153/66 (!) 150/70  Pulse: 73 70  Resp: 19 18  Temp:  36.5 C  SpO2: 96% 96%    Last Pain:  Vitals:   05/26/23 1416  TempSrc: Oral  PainSc: 0-No pain                 Cleda Mccreedy Arwa Yero

## 2023-05-29 ENCOUNTER — Other Ambulatory Visit: Payer: Self-pay | Admitting: Urology

## 2023-06-01 ENCOUNTER — Other Ambulatory Visit: Payer: PPO

## 2023-06-01 ENCOUNTER — Other Ambulatory Visit: Payer: Self-pay

## 2023-06-01 DIAGNOSIS — N401 Enlarged prostate with lower urinary tract symptoms: Secondary | ICD-10-CM | POA: Diagnosis not present

## 2023-06-01 DIAGNOSIS — N3943 Post-void dribbling: Secondary | ICD-10-CM | POA: Diagnosis not present

## 2023-06-02 ENCOUNTER — Ambulatory Visit
Admission: RE | Admit: 2023-06-02 | Discharge: 2023-06-02 | Disposition: A | Discharge status: Home / Self Care | Payer: PPO | Source: Ambulatory Visit | Attending: Urology | Admitting: Urology

## 2023-06-02 DIAGNOSIS — N200 Calculus of kidney: Secondary | ICD-10-CM | POA: Insufficient documentation

## 2023-06-02 LAB — PSA: Prostate Specific Ag, Serum: 6.7 ng/mL — ABNORMAL HIGH (ref 0.0–4.0)

## 2023-06-03 ENCOUNTER — Ambulatory Visit: Payer: Self-pay | Admitting: Urology

## 2023-06-03 VITALS — BP 125/73 | HR 94 | Ht 74.0 in | Wt 221.0 lb

## 2023-06-03 DIAGNOSIS — N3943 Post-void dribbling: Secondary | ICD-10-CM | POA: Diagnosis not present

## 2023-06-03 DIAGNOSIS — N401 Enlarged prostate with lower urinary tract symptoms: Secondary | ICD-10-CM | POA: Diagnosis not present

## 2023-06-03 LAB — URINALYSIS, COMPLETE
Bilirubin, UA: NEGATIVE
Glucose, UA: NEGATIVE
Ketones, UA: NEGATIVE
Nitrite, UA: NEGATIVE
Specific Gravity, UA: 1.025 (ref 1.005–1.030)
Urobilinogen, Ur: 0.2 mg/dL (ref 0.2–1.0)
pH, UA: 5 (ref 5.0–7.5)

## 2023-06-03 LAB — MICROSCOPIC EXAMINATION: RBC, Urine: >30 /[HPF] — AB (ref 0–2)

## 2023-06-03 LAB — BLADDER SCAN AMB NON-IMAGING: Scan Result: 0

## 2023-06-03 NOTE — Progress Notes (Signed)
 Marcelle Overlie Plume,acting as a scribe for Vanna Scotland, MD.,have documented all relevant documentation on the behalf of Vanna Scotland, MD,as directed by  Vanna Scotland, MD while in the presence of Vanna Scotland, MD.  06/03/23 2:22 PM   Jeffrey Buchanan 30-Nov-1955 [id number]  Referring provider: Lynnea Ferrier, MD [address] [facility] [place],  [place] [address]  Chief Complaint  Patient presents with   Benign Prostatic Hypertrophy    HPI: 68 year old male who returns for a follow-up appointment initially scheduled for his annual check-up. In the interim, he has undergone multiple procedures with Dr. Lonna Cobb for bladder and left kidney stones, with the most recent procedure on 05/25/2022. He has a history of bladder stones, likely related to prostate issues, and has been prescribed potassium citrate, which is intended to reduce uric acid stones.   He had a KUB that was personally reviewed by me. There is a shadow over the left kidney, but suspect this is bowel.   He reports bloody urine, which is attributed to irritation from a stent rubbing against his enlarged prostate and bladder. He is scheduled to have the stent removed next week. He is currently emptying his bladder fine and is considering further management options for his prostate condition.  Results for orders placed or performed in visit on 06/03/23  Bladder Scan (Post Void Residual) in office  Result Value Ref Range   Scan Result 0 ml     IPSS     Row Name 06/03/23 1300         International Prostate Symptom Score   How often have you had the sensation of not emptying your bladder? Less than half the time     How often have you had to urinate less than every two hours? Almost always     How often have you found you stopped and started again several times when you urinated? Less than 1 in 5 times     How often have you found it difficult to postpone urination? About half the time     How  often have you had a weak urinary stream? Less than 1 in 5 times     How often have you had to strain to start urination? Less than 1 in 5 times     How many times did you typically get up at night to urinate? 4 Times     Total IPSS Score 17       Quality of Life due to urinary symptoms   If you were to spend the rest of your life with your urinary condition just the way it is now how would you feel about that? Mostly Disatisfied              Score:  1-7 Mild 8-19 Moderate 20-35 Severe    PMH: Past Medical History:  Diagnosis Date   Acute MI, inferior wall (HCC) 04/06/2003   a.) PCI 04/10/2003 --> 90% pRCA --> 3.5 x 18 mm and 4.0 x 23 mm Vision stents to RCA.   Acute pyelonephritis 12/19/2022   Anxiety    Arthritis    BPH (benign prostatic hyperplasia)    Chronic anticoagulation    a.) DAPT therapy (ASA + clopidogrel)   Chronic back pain    Coronary artery disease 04/10/2003   a.) LHC 04/10/2003 --> EF 40%; 90% pRCA, 40% x 2 mRCA, 30% and 20% pLCx; 3.5 x 18 mm and 4.0 x 23 mm Vision stents to  RCA.   DJD (degenerative joint disease)    GERD (gastroesophageal reflux disease)    Headache    History of adenomatous polyp of colon    S/P  PARTIAL COLECTOMY 2006   History of kidney stones    HLD (hyperlipidemia)    Hyperlipidemia    Hypertension    Left ureteral stone 12/19/2022   MLH1-related Lynch syndrome (HNPCC2) 12/18/2022   Renal disorder    Skin cancer    Urothelial carcinoma of bladder (HCC) 09/14/2012   a.) Bx 09/14/2012 --> pathology (+) for low grade papillary urothelial carcinoma.   Vitamin D deficiency     Surgical History: Past Surgical History:  Procedure Laterality Date   COLONOSCOPY WITH PROPOFOL N/A 03/04/2017   Procedure: COLONOSCOPY WITH PROPOFOL;  Surgeon: Toledo, Boykin Nearing, MD;  Location: [facility];  Service: Gastroenterology;  Laterality: N/A;   CORONARY ANGIOPLASTY WITH STENT PLACEMENT Left 04/10/2003   Procedure: CORONARY ANGIOPLASTY  WITH STENT PLACEMENT (3.5 x 18 mm and 4.0 x 23 mm Vision stents to RCA); Location: [facility]; Surgeon: Arnoldo Hooker, MD   CYSTOSCOPY W/ RETROGRADES Bilateral 09/26/2013   Procedure: CYSTOSCOPY WITH BILATERAL RETROGRADE PYELOGRAM;  Surgeon: Magdalene Molly, MD;  Location: [facility];  Service: Urology;  Laterality: Bilateral;   CYSTOSCOPY WITH LITHOLAPAXY N/A 06/13/2019   Procedure: CYSTOSCOPY;  Surgeon: Vanna Scotland, MD;  Location: [facility];  Service: Urology;  Laterality: N/A;   CYSTOSCOPY WITH LITHOLAPAXY N/A 01/28/2021   Procedure: CYSTOSCOPY WITH LITHOLAPAXY;  Surgeon: Vanna Scotland, MD;  Location: [facility];  Service: Urology;  Laterality: N/A;   CYSTOSCOPY WITH LITHOLAPAXY N/A 12/20/2022   Procedure: CYSTOSCOPY WITH LITHOLAPAXY for Bladder Larina Bras;  Surgeon: [name], MD;  Location: [facility];  Service: Urology;  Laterality: N/A;   CYSTOSCOPY/URETEROSCOPY/HOLMIUM LASER/STENT PLACEMENT Left 12/20/2022   Procedure: CYSTOSCOPY/URETEROSCOPY/HOLMIUM LASER/STENT PLACEMENT;  Surgeon: [name], MD;  Location: [facility];  Service: Urology;  Laterality: Left;   CYSTOSCOPY/URETEROSCOPY/HOLMIUM LASER/STENT PLACEMENT Left 05/26/2023   Procedure: CYSTOSCOPY/URETEROSCOPY/HOLMIUM LASER/STENT PLACEMENT;  Surgeon: [name], MD;  Location: [facility];  Service: Urology;  Laterality: Left;   ESOPHAGOGASTRODUODENOSCOPY (EGD) WITH PROPOFOL N/A 03/04/2017   Procedure: ESOPHAGOGASTRODUODENOSCOPY (EGD) WITH PROPOFOL;  Surgeon: Toledo, Boykin Nearing, MD;  Location: [facility];  Service: Gastroenterology;  Laterality: N/A;   OPEN REDUCTION SHOULDER DISLOCATION  04/07/1974   PARTIAL COLECTOMY  04/07/2004   STONE EXTRACTION WITH BASKET N/A 06/13/2019   Procedure: STONE EXTRACTION WITH BASKET;  Surgeon: Vanna Scotland, MD;  Location: [facility];  Service: Urology;  Laterality: N/A;   TRANSURETHRAL RESECTION OF BLADDER TUMOR N/A 2014   TRANSURETHRAL RESECTION OF BLADDER TUMOR WITH GYRUS  (TURBT-GYRUS) N/A 09/26/2013   Procedure: BLADDER BIOPSY;  Surgeon: Magdalene Molly, MD;  Location: [facility];  Service: Urology;  Laterality: N/A;    Home Medications:  Allergies as of 06/03/2023       Reactions   Cephalosporins Diarrhea   Escitalopram Oxalate Other (See Comments)   lethargic        Medication List        Accurate as of June 03, 2023  2:22 PM. If you have any questions, ask your nurse or doctor.          STOP taking these medications    ketorolac 10 MG tablet Commonly known as: TORADOL   oxybutynin 5 MG tablet Commonly known as: DITROPAN   oxyCODONE-acetaminophen 5-325 MG tablet Commonly known as: Percocet       TAKE these medications  ALPRAZolam 0.5 MG tablet Commonly known as: XANAX Take 0.5 mg by mouth 2 (two) times daily as needed for sleep or anxiety.   amiodarone 200 MG tablet Commonly known as: PACERONE Take 200 mg by mouth at bedtime.   aspirin EC 81 MG tablet Take 81 mg by mouth at bedtime.   clopidogrel 75 MG tablet Commonly known as: PLAVIX Take 75 mg by mouth at bedtime.   docusate sodium 100 MG capsule Commonly known as: COLACE Take 100 mg by mouth 2 (two) times daily as needed for mild constipation or moderate constipation.   finasteride 5 MG tablet Commonly known as: PROSCAR TAKE 1 TABLET BY MOUTH EVERY DAY   hydrochlorothiazide 12.5 MG capsule Commonly known as: MICROZIDE Take 12.5 mg by mouth daily.   HYDROcodone-acetaminophen 5-325 MG tablet Commonly known as: NORCO/VICODIN Take 1 tablet by mouth every 6 (six) hours as needed for moderate pain.   lisinopril 10 MG tablet Commonly known as: ZESTRIL Take 10 mg by mouth daily.   metoprolol succinate 50 MG 24 hr tablet Commonly known as: TOPROL-XL Take 50 mg by mouth every evening. Take with or immediately following a meal.   Potassium Citrate 15 MEQ (1620 MG) Tbcr TAKE 1 TABLET BY MOUTH IN THE MORNING AND IN THE EVENING    rosuvastatin 40 MG tablet Commonly known as: CRESTOR Take 40 mg by mouth at bedtime.   tamsulosin 0.4 MG Caps capsule Commonly known as: FLOMAX Take 2 capsules (0.8 mg total) by mouth at bedtime.   Vitamin D3 50 MCG (2000 UT) Tabs Take 2,000 Units by mouth daily.        Allergies:  Allergies  Allergen Reactions   Cephalosporins Diarrhea   Escitalopram Oxalate Other (See Comments)    lethargic    Family History: Family History  Problem Relation Age of Onset   Bladder Cancer Mother        dx 28s   Prostate cancer Brother        dx 45s   Cancer Maternal Grandmother        unk type   Kidney disease Neg Hx     Social History:  reports that he has been smoking cigarettes. He has a 32 pack-year smoking history. He has never used smokeless tobacco. He reports that he does not drink alcohol and does not use drugs.   Physical Exam: BP 125/73   Pulse 94   Ht 6\' 2"  (1.88 m)   Wt 221 lb (100.2 kg)   BMI 28.37 kg/m   Constitutional:  Alert and oriented, No acute distress. HEENT: [place] AT, moist mucus membranes.  Trachea midline, no masses. Cardiovascular: No clubbing, cyanosis, or edema. Respiratory: Normal respiratory effort, no increased work of breathing. GI: Abdomen is soft, nontender, nondistended, no abdominal masses GU: No CVA tenderness Skin: No rashes, bruises or suspicious lesions. Neurologic: Grossly intact, no focal deficits, moving all 4 extremities. Psychiatric: Normal mood and affect.   Pertinent Imaging:  KUB performed on 06/02/2023 was personally reviewed by me. Radiologic interpretation is pending.    EXAM: CT ABDOMEN AND PELVIS WITHOUT CONTRAST  TECHNIQUE: Multidetector CT imaging of the abdomen and pelvis was performed following the standard protocol without IV contrast.  RADIATION DOSE REDUCTION: This exam was performed according to the departmental dose-optimization program which includes automated exposure control, adjustment of the mA  and/or kV according to patient size and/or use of iterative reconstruction technique.  COMPARISON:  12/19/2022  FINDINGS: Lower chest: No acute abnormality.  Hepatobiliary: Small  cyst at the inferior right hepatic lobe. Otherwise unremarkable unenhanced appearance of the liver. Contracted gallbladder. No hyperdense gallstone or inflammatory changes.  Pancreas: Unremarkable. No pancreatic ductal dilatation or surrounding inflammatory changes.  Spleen: Normal in size without focal abnormality.  Adrenals/Urinary Tract: Unremarkable adrenal glands. Multiple bilateral renal calculi. 8 x 6 mm stone at the left ureteropelvic junction contributing to mild left hydronephrosis. Staghorn calculus at the lower pole of the right kidney. Stable right renal cyst which does not require follow-up imaging. No ureteral calculi. Urinary bladder within normal limits.  Stomach/Bowel: Stomach within normal limits. No evidence of bowel obstruction or active bowel inflammation.  Vascular/Lymphatic: Aortic atherosclerosis. Stable 3.0 cm infrarenal abdominal aortic aneurysm. No enlarged abdominal or pelvic lymph nodes.  Reproductive: Prostatomegaly.  Other: No free fluid. No abdominopelvic fluid collection. No pneumoperitoneum. No abdominal wall hernia.  Musculoskeletal: No acute or significant osseous findings.  IMPRESSION: 1. Bilateral nephrolithiasis with 8 x 6 mm stone at the left ureteropelvic junction contributing to mild left hydronephrosis. 2. Stable 3.0 cm infrarenal abdominal aortic aneurysm. Recommend follow-up every 3 years. 3. Prostatomegaly. 4. Aortic atherosclerosis (ICD10-I70.0).   Electronically Signed By: Duanne Guess D.O. On: 05/21/2023 15:50  This was personally reviewed and I agree with the radiologic interpretation.    Assessment & Plan:    1. Hematuria - He reports bloody urine, attributed to irritation from the stent rubbing against the prostate and  bladder. - Reassurance was provided that hematuria is expected due to the stent.  - Monitor for resolution post-stent removal. If hematuria persists, consider further evaluation.  2. Bladder and Kidney Stones - History of bladder and left kidney stones, with recent procedures performed by Dr. Lonna Cobb.  - KUB x-ray was personally reviewed, showing a shadow over the left kidney, suspected to be bowel-related.  - Continue with the scheduled stent removal. Monitor for any complications or recurrence of stones. Discuss with Dr. Lonna Cobb regarding further management and potential need for prostate intervention to prevent future bladder stones. - Continue potassium citrate as prescribed. Discussed the potential benefits and limitations of this therapy in preventing future stones, particularly calcium stones.  3. Elevated PSA -Mentioned his most recent PSA which was somewhat elevated but in the setting of recent manipulation.  Would recommend repeating this in 3 months down the road.  Return in about 1 week (around 06/10/2023) for stent removal and further evaluation of urologic health.  I have reviewed the above documentation for accuracy and completeness, and I agree with the above.   Vanna Scotland, MD    [facility] [address] [place], [place] [address] [phone]

## 2023-06-08 ENCOUNTER — Encounter: Payer: PPO | Admitting: Urology

## 2023-06-08 DIAGNOSIS — E042 Nontoxic multinodular goiter: Secondary | ICD-10-CM | POA: Diagnosis not present

## 2023-06-09 DIAGNOSIS — E042 Nontoxic multinodular goiter: Secondary | ICD-10-CM | POA: Diagnosis not present

## 2023-06-10 ENCOUNTER — Encounter: Payer: Self-pay | Admitting: Urology

## 2023-06-10 ENCOUNTER — Ambulatory Visit (INDEPENDENT_AMBULATORY_CARE_PROVIDER_SITE_OTHER): Payer: PPO | Admitting: Urology

## 2023-06-10 VITALS — BP 129/71 | HR 103 | Ht 74.0 in | Wt 220.0 lb

## 2023-06-10 DIAGNOSIS — Z466 Encounter for fitting and adjustment of urinary device: Secondary | ICD-10-CM

## 2023-06-10 DIAGNOSIS — N401 Enlarged prostate with lower urinary tract symptoms: Secondary | ICD-10-CM | POA: Diagnosis not present

## 2023-06-10 DIAGNOSIS — N3943 Post-void dribbling: Secondary | ICD-10-CM | POA: Diagnosis not present

## 2023-06-10 DIAGNOSIS — N5089 Other specified disorders of the male genital organs: Secondary | ICD-10-CM

## 2023-06-10 LAB — URINALYSIS, COMPLETE
Bilirubin, UA: NEGATIVE
Glucose, UA: NEGATIVE
Ketones, UA: NEGATIVE
Nitrite, UA: NEGATIVE
Specific Gravity, UA: 1.025 (ref 1.005–1.030)
Urobilinogen, Ur: 0.2 mg/dL (ref 0.2–1.0)
pH, UA: 5.5 (ref 5.0–7.5)

## 2023-06-10 LAB — MICROSCOPIC EXAMINATION: RBC, Urine: >30 /HPF — AB (ref 0–2)

## 2023-06-10 MED ORDER — SULFAMETHOXAZOLE-TRIMETHOPRIM 800-160 MG PO TABS
1.0000 | ORAL_TABLET | Freq: Once | ORAL | Status: AC
Start: 2023-06-10 — End: 2023-06-10
  Administered 2023-06-10: 1 via ORAL

## 2023-06-10 NOTE — Progress Notes (Signed)
   Indications: Patient is 68 y.o., who is s/p ureteroscopic dusting/removal of a several left renal calculi 05/26/2023.  Follow-up KUB performed last week showed no definite significant stone fragments.  The patient is presenting today for stent removal.  He also states this morning that he had an area of bleeding from his scrotum when toweling off after shower and when applying pressure noted a small nodule that he wanted to have checked.  Exam: 2 mm smooth nodule left lateral testis  Procedure:  Flexible Cystoscopy with stent removal (29562)  Timeout was performed and the correct patient, procedure and participants were identified.    Description:  The patient was prepped and draped in the usual sterile fashion. Flexible cystosopy was performed.  The stent was visualized, grasped, and removed intact without difficulty. The patient tolerated the procedure well.  A single dose of oral antibiotics was given.  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

## 2023-06-11 ENCOUNTER — Ambulatory Visit
Admission: RE | Admit: 2023-06-11 | Discharge: 2023-06-11 | Disposition: A | Discharge status: Home / Self Care | Source: Ambulatory Visit | Attending: Urology | Admitting: Urology

## 2023-06-11 DIAGNOSIS — N5089 Other specified disorders of the male genital organs: Secondary | ICD-10-CM | POA: Insufficient documentation

## 2023-06-11 DIAGNOSIS — N503 Cyst of epididymis: Secondary | ICD-10-CM | POA: Diagnosis not present

## 2023-06-17 ENCOUNTER — Ambulatory Visit: Payer: PPO | Admitting: Dermatology

## 2023-06-17 DIAGNOSIS — L814 Other melanin hyperpigmentation: Secondary | ICD-10-CM

## 2023-06-17 DIAGNOSIS — D239 Other benign neoplasm of skin, unspecified: Secondary | ICD-10-CM

## 2023-06-17 DIAGNOSIS — D485 Neoplasm of uncertain behavior of skin: Secondary | ICD-10-CM

## 2023-06-17 DIAGNOSIS — L570 Actinic keratosis: Secondary | ICD-10-CM | POA: Diagnosis not present

## 2023-06-17 DIAGNOSIS — D2339 Other benign neoplasm of skin of other parts of face: Secondary | ICD-10-CM

## 2023-06-17 DIAGNOSIS — D1801 Hemangioma of skin and subcutaneous tissue: Secondary | ICD-10-CM | POA: Diagnosis not present

## 2023-06-17 DIAGNOSIS — L578 Other skin changes due to chronic exposure to nonionizing radiation: Secondary | ICD-10-CM | POA: Diagnosis not present

## 2023-06-17 DIAGNOSIS — H61002 Unspecified perichondritis of left external ear: Secondary | ICD-10-CM | POA: Diagnosis not present

## 2023-06-17 DIAGNOSIS — D229 Melanocytic nevi, unspecified: Secondary | ICD-10-CM

## 2023-06-17 DIAGNOSIS — D492 Neoplasm of unspecified behavior of bone, soft tissue, and skin: Secondary | ICD-10-CM | POA: Diagnosis not present

## 2023-06-17 DIAGNOSIS — Z1283 Encounter for screening for malignant neoplasm of skin: Secondary | ICD-10-CM | POA: Diagnosis not present

## 2023-06-17 DIAGNOSIS — L821 Other seborrheic keratosis: Secondary | ICD-10-CM

## 2023-06-17 DIAGNOSIS — D23121 Other benign neoplasm of skin of left upper eyelid, including canthus: Secondary | ICD-10-CM

## 2023-06-17 DIAGNOSIS — L918 Other hypertrophic disorders of the skin: Secondary | ICD-10-CM | POA: Diagnosis not present

## 2023-06-17 DIAGNOSIS — W908XXA Exposure to other nonionizing radiation, initial encounter: Secondary | ICD-10-CM

## 2023-06-17 DIAGNOSIS — Z1509 Genetic susceptibility to other malignant neoplasm: Secondary | ICD-10-CM

## 2023-06-17 NOTE — Patient Instructions (Addendum)
 Cryotherapy Aftercare  Wash gently with soap and water everyday.   Apply Vaseline and Band-Aid daily until healed.     Wound Care Instructions  Cleanse wound gently with soap and water once a day then pat dry with clean gauze. Apply a thin coat of Petrolatum (petroleum jelly, "Vaseline") over the wound (unless you have an allergy to this). We recommend that you use a new, sterile tube of Vaseline. Do not pick or remove scabs. Do not remove the yellow or white "healing tissue" from the base of the wound.  Cover the wound with fresh, clean, nonstick gauze and secure with paper tape. You may use Band-Aids in place of gauze and tape if the wound is small enough, but would recommend trimming much of the tape off as there is often too much. Sometimes Band-Aids can irritate the skin.  You should call the office for your biopsy report after 1 week if you have not already been contacted.  If you experience any problems, such as abnormal amounts of bleeding, swelling, significant bruising, significant pain, or evidence of infection, please call the office immediately.  FOR ADULT SURGERY PATIENTS: If you need something for pain relief you may take 1 extra strength Tylenol (acetaminophen) AND 2 Ibuprofen (200mg  each) together every 4 hours as needed for pain. (do not take these if you are allergic to them or if you have a reason you should not take them.) Typically, you may only need pain medication for 1 to 3 days.    Recommend OTC 1% hydrocortisone cream 1-2 times daily to affected area until itchy rash cleared.  Chondrodermatitis Nodularis Chronica Helicis (CNCH or CNH) is a common, benign inflammatory condition of the ear cartilage and overlying skin associated with very sensitive tender papule(s).  Trauma or pressure from sleeping on the ear or from cell phone use and sun damage may be exacerbating factors.  Treatment may include using a C-shaped airplane neck pillow for sleeping on the side of the  head so no pressure is on the ear.  Other treatments include topical or intralesional steroids; liquid nitrogen or laser destruction; shave removal or excision.  The condition can be difficult to treat and persist or recur despite treatment.  Due to recent changes in healthcare laws, you may see results of your pathology and/or laboratory studies on MyChart before the doctors have had a chance to review them. We understand that in some cases there may be results that are confusing or concerning to you. Please understand that not all results are received at the same time and often the doctors may need to interpret multiple results in order to provide you with the best plan of care or course of treatment. Therefore, we ask that you please give [place] 2 business days to thoroughly review all your results before contacting the office for clarification. Should we see a critical lab result, you will be contacted sooner.   If You Need Anything After Your Visit  If you have any questions or concerns for your doctor, please call our main line at [phone] and press option 4 to reach your doctor's medical assistant. If no one answers, please leave a voicemail as directed and we will return your call as soon as possible. Messages left after 4 pm will be answered the following business day.   You may also send [place] a message via MyChart. We typically respond to MyChart messages within 1-2 business days.  For prescription refills, please ask your pharmacy to contact our  office. Our fax number is [phone].  If you have an urgent issue when the clinic is closed that cannot wait until the next business day, you can page your doctor at the number below.    Please note that while we do our best to be available for urgent issues outside of office hours, we are not available 24/7.   If you have an urgent issue and are unable to reach [place], you may choose to seek medical care at your doctor's office, retail clinic,  urgent care center, or emergency room.  If you have a medical emergency, please immediately call 911 or go to the emergency department.  Pager Numbers  - Dr. Gwen Pounds: [phone]  - Dr. Roseanne Reno: [phone]  - Dr. Katrinka Blazing: [phone]   In the event of inclement weather, please call our main line at [phone] for an update on the status of any delays or closures.  Dermatology Medication Tips: Please keep the boxes that topical medications come in in order to help keep track of the instructions about where and how to use these. Pharmacies typically print the medication instructions only on the boxes and not directly on the medication tubes.   If your medication is too expensive, please contact our office at [phone] option 4 or send [place] a message through MyChart.   We are unable to tell what your co-pay for medications will be in advance as this is different depending on your insurance coverage. However, we may be able to find a substitute medication at lower cost or fill out paperwork to get insurance to cover a needed medication.   If a prior authorization is required to get your medication covered by your insurance company, please allow [place] 1-2 business days to complete this process.  Drug prices often vary depending on where the prescription is filled and some pharmacies may offer cheaper prices.  The website www.goodrx.com contains coupons for medications through different pharmacies. The prices here do not account for what the cost may be with help from insurance (it may be cheaper with your insurance), but the website can give you the price if you did not use any insurance.  - You can print the associated coupon and take it with your prescription to the pharmacy.  - You may also stop by our office during regular business hours and pick up a GoodRx coupon card.  - If you need your prescription sent electronically to a different pharmacy, notify our office through [facility] or by phone at [phone] option 4.     Si Usted Necesita Algo Despus de Su Visita  Tambin puede enviarnos un mensaje a travs de Clinical cytogeneticist. Por lo general respondemos a los mensajes de MyChart en el transcurso de 1 a 2 das hbiles.  Para renovar recetas, por favor pida a su farmacia que se ponga en contacto con nuestra oficina. Annie Sable de fax es [place] [phone].  Si tiene un asunto urgente cuando la clnica est cerrada y que no puede esperar hasta el siguiente da hbil, puede llamar/localizar a su doctor(a) al nmero que aparece a continuacin.   Por favor, tenga en cuenta que aunque hacemos todo lo posible para estar disponibles para asuntos urgentes fuera del horario de [place], no estamos disponibles las 24 horas del da, los 7 [address]  [address] de la [place].   Si tiene un problema urgente y no puede comunicarse con nosotros, puede optar por buscar atencin mdica  en el consultorio de su doctor(a), en una clnica privada,  en un centro de atencin urgente o en una sala de emergencias.  Si tiene Engineer, drilling, por favor llame inmediatamente al 911 o vaya a la sala de emergencias.  Nmeros de bper  - Dr. Gwen Pounds: [phone]  - Dra. Roseanne Reno: [phone]  - Dr. Katrinka Blazing: [phone]   En caso de inclemencias del tiempo, por favor llame a Lacy Duverney principal al [phone] para una actualizacin sobre el [place] de cualquier retraso o cierre.  Consejos para la medicacin en dermatologa: Por favor, guarde las cajas en las que vienen los medicamentos de uso tpico para ayudarle a seguir las instrucciones sobre dnde y cmo usarlos. Las farmacias generalmente imprimen las instrucciones del medicamento slo en las cajas y no directamente en los tubos del [place].   Si su medicamento es muy caro, por favor, pngase en contacto con Rolm Gala llamando al [phone] y presione la opcin 4 o envenos un mensaje a travs de Clinical cytogeneticist.   No podemos decirle cul  ser su copago por los medicamentos por adelantado ya que esto es diferente dependiendo de la cobertura de su seguro. Sin embargo, es posible que podamos encontrar un medicamento sustituto a Audiological scientist un formulario para que el seguro cubra el medicamento que se considera necesario.   Si se requiere una autorizacin previa para que su compaa de seguros [place] su medicamento, por favor permtanos de 1 a 2 das hbiles para completar [address]  Los precios de los medicamentos varan con frecuencia dependiendo del Environmental consultant de dnde se surte la receta y alguna farmacias pueden ofrecer precios ms baratos.  El sitio web www.goodrx.com tiene cupones para medicamentos de Health and safety inspector. Los precios aqu no tienen en cuenta lo que podra costar con la ayuda del seguro (puede ser ms barato con su seguro), pero el sitio web puede darle el precio si no utiliz Tourist information centre manager.  - Puede imprimir el cupn correspondiente y llevarlo con su receta a la farmacia.  - Tambin puede pasar por nuestra oficina durante el horario de atencin regular y Education officer, museum una tarjeta de cupones de GoodRx.  - Si necesita que su receta se enve electrnicamente a una farmacia diferente, informe a nuestra oficina a travs de MyChart de [place] o por telfono llamando al [phone] y presione la opcin 4.

## 2023-06-17 NOTE — Progress Notes (Deleted)
 Jeffrey Buchanan

## 2023-06-17 NOTE — Progress Notes (Signed)
 Follow-Up Visit   Subjective  Jeffrey Buchanan is a 67 y.o. male who presents for the following: Skin Cancer Screening and Upper Body Skin Exam  The patient presents for Upper Body Skin Exam (UBSE) for skin cancer screening and mole check. The patient has spots, moles and lesions to be evaluated, some may be new or changing and the patient may have concern these could be cancer. Spot on nose previously frozen, has grown back. Patient has biopsy proven sebaceous adenoma of the right temple, treated with [facility] 11/11/2022. Patient was seen by genetic counselor at [facility] and tested positive for Lynch Syndrome. His 3 brothers were sent the information to be tested as well, two were positive, one brother was also positive for Colon cancer and was caught soon enough to be treated with immunotherapy.    The following portions of the chart were reviewed this encounter and updated as appropriate: medications, allergies, medical history  Review of Systems:  No other skin or systemic complaints except as noted in HPI or Assessment and Plan.  Objective  Well appearing patient in no apparent distress; mood and affect are within normal limits.  All skin waist up examined. Relevant physical exam findings are noted in the Assessment and Plan.  Right nasal dorsum 2.5 mm firm flesh scaly papule   L 5th finger dorsum x 1 Pink/tan keratotic macule  Assessment & Plan   NEOPLASM OF UNCERTAIN BEHAVIOR OF SKIN Right nasal dorsum Epidermal / dermal shaving  Lesion diameter (cm):  0.3 Informed consent: discussed and consent obtained   Patient was prepped and draped in usual sterile fashion: Area prepped with alcohol. Anesthesia: the lesion was anesthetized in a standard fashion   Anesthetic:  1% lidocaine w/ epinephrine 1-100,000 buffered w/ 8.4% NaHCO3 Instrument used: flexible razor blade   Hemostasis achieved with: pressure, aluminum chloride and electrodesiccation   Outcome: patient tolerated procedure  well   Post-procedure details: wound care instructions given   Post-procedure details comment:  Ointment and small bandage applied Specimen 1 - Surgical pathology Differential Diagnosis: Inflamed SK vs Wart vs Sebaceous Adenoma Check Margins: No Patient with Lynch Syndrome AK (ACTINIC KERATOSIS) L 5th finger dorsum x 1 Actinic keratoses are precancerous spots that appear secondary to cumulative UV radiation exposure/sun exposure over time. They are chronic with expected duration over 1 year. A portion of actinic keratoses will progress to squamous cell carcinoma of the skin. It is not possible to reliably predict which spots will progress to skin cancer and so treatment is recommended to prevent development of skin cancer.  Recommend daily broad spectrum sunscreen SPF 30+ to sun-exposed areas, reapply every 2 hours as needed.  Recommend staying in the shade or wearing long sleeves, sun glasses (UVA+UVB protection) and wide brim hats (4-inch brim around the entire circumference of the hat). Call for new or changing lesions. Destruction of lesion - L 5th finger dorsum x 1  Destruction method: cryotherapy   Informed consent: discussed and consent obtained   Lesion destroyed using liquid nitrogen: Yes   Region frozen until ice ball extended beyond lesion: Yes   Outcome: patient tolerated procedure well with no complications   Post-procedure details: wound care instructions given   Additional details:  Prior to procedure, discussed risks of blister formation, small wound, skin dyspigmentation, or rare scar following cryotherapy. Recommend Vaseline ointment to treated areas while healing.  Skin cancer screening performed today.  Actinic Damage - Chronic condition, secondary to cumulative UV/sun exposure - diffuse scaly erythematous  macules with underlying dyspigmentation - Recommend daily broad spectrum sunscreen SPF 30+ to sun-exposed areas, reapply every 2 hours as needed.  - Staying in the  shade or wearing long sleeves, sun glasses (UVA+UVB protection) and wide brim hats (4-inch brim around the entire circumference of the hat) are also recommended for sun protection.  - Call for new or changing lesions.  Lentigines, Seborrheic Keratoses, Hemangiomas - Benign normal skin lesions - Benign-appearing - Call for any changes  Melanocytic Nevi - Tan-brown and/or pink-flesh-colored symmetric macules and papules - Benign appearing on exam today - Observation - Call clinic for new or changing moles - Recommend daily use of broad spectrum spf 30+ sunscreen to sun-exposed areas.   Acrochordons (Skin Tags) - Fleshy, skin-colored pedunculated papules - Benign appearing.  - Observe. - If desired, they can be removed with an in office procedure that is not covered by insurance. - Please call the clinic if you notice any new or changing lesions.  Eyelid neoplasm- Possible Cyst vs BCC vrs sebaceous adenoma/CA   Exam: 2.5 mm flesh smooth papule at left medial mucosal upper eyelid, appears stable in size when compared to previous photo.   Treatment Plan: Probable benign lesion but with diagnosis of Lynch syndrome, needs close monitoring due to increase risk of developing sebaceous neoplasms. Area is being monitored by Ophthalmology. Advised to keep f/u appt.     Sebaceous Adenoma - R temple, biopsy proven and treated with [facility]. Patient tested positive for Lynch Syndrome (Muir-Torre) Exam: Clear today at Right temple  Muir-Torre syndrome is a chronic genetic condition, with sebaceous neoplasms and increase cancer risk  Observe for recurrence. Call clinic for new or changing lesions.  Recommend regular skin exams, daily broad-spectrum spf 30+ sunscreen use, and photoprotection.  Pt needs close f/up with PCP for cancer screenings.    CHONDRODERMATITIS NODULARIS HELICIS Exam: Scaly pink patch overlying prominent cartilage at the left ear, pt sleeps on left side.  Not bothersome to  patient.  Chondrodermatitis Nodularis Chronica Helicis (CNCH or CNH) is a common, benign inflammatory condition of the ear cartilage and overlying skin associated with very sensitive tender papule(s).  Trauma or pressure from sleeping on the ear or from cell phone use and sun damage may be exacerbating factors.  Treatment may include using a C-shaped airplane neck pillow for sleeping on the side of the head so no pressure is on the ear.  Other treatments include topical or intralesional steroids; liquid nitrogen or laser destruction; shave removal or excision.  The condition can be difficult to treat and persist or recur despite treatment.  Treatment Plan: Recommend using donut pillow to minimize pressure on the left ear while sleeping. Recommend OTC 1% hydrocortisone cream 1-2 times daily to affected area as needed.  Return in about 6 months (around 12/18/2023) for UBSE.  ICherlyn Labella, CMA, am acting as scribe for Willeen Niece, MD .   Documentation: I have reviewed the above documentation for accuracy and completeness, and I agree with the above.  Willeen Niece, MD

## 2023-06-18 ENCOUNTER — Other Ambulatory Visit: Payer: Self-pay | Admitting: *Deleted

## 2023-06-18 ENCOUNTER — Encounter: Payer: Self-pay | Admitting: Urology

## 2023-06-18 DIAGNOSIS — Z87898 Personal history of other specified conditions: Secondary | ICD-10-CM

## 2023-06-19 LAB — SURGICAL PATHOLOGY

## 2023-06-22 ENCOUNTER — Telehealth: Payer: Self-pay

## 2023-06-22 NOTE — Telephone Encounter (Signed)
 Advised pt of bx results/sh ?

## 2023-06-22 NOTE — Telephone Encounter (Signed)
-----   Message from Willeen Niece sent at 06/22/2023 12:58 PM EDT ----- 1. Skin, right nasal dorsum :       SEBACEOUS ADENOMA  Benign sebaceous gland tumor seen in Muir-Torre Syndrome (a subset of Lynch syndrome).  Pt has had genetic testing confirming he has Lynch syndrome - please call patient

## 2023-07-05 ENCOUNTER — Encounter: Payer: Self-pay | Admitting: Urology

## 2023-07-07 ENCOUNTER — Other Ambulatory Visit: Payer: Self-pay | Admitting: Urology

## 2023-07-07 MED ORDER — POTASSIUM CITRATE ER 15 MEQ (1620 MG) PO TBCR: 1.0000 | EXTENDED_RELEASE_TABLET | Freq: Two times a day (BID) | ORAL | Status: AC

## 2023-07-08 DIAGNOSIS — I10 Essential (primary) hypertension: Secondary | ICD-10-CM | POA: Diagnosis not present

## 2023-07-14 DIAGNOSIS — M9902 Segmental and somatic dysfunction of thoracic region: Secondary | ICD-10-CM | POA: Diagnosis not present

## 2023-07-14 DIAGNOSIS — M9905 Segmental and somatic dysfunction of pelvic region: Secondary | ICD-10-CM | POA: Diagnosis not present

## 2023-07-14 DIAGNOSIS — M62830 Muscle spasm of back: Secondary | ICD-10-CM | POA: Diagnosis not present

## 2023-07-14 DIAGNOSIS — M9903 Segmental and somatic dysfunction of lumbar region: Secondary | ICD-10-CM | POA: Diagnosis not present

## 2023-07-14 DIAGNOSIS — M955 Acquired deformity of pelvis: Secondary | ICD-10-CM | POA: Diagnosis not present

## 2023-07-14 DIAGNOSIS — M5136 Other intervertebral disc degeneration, lumbar region with discogenic back pain only: Secondary | ICD-10-CM | POA: Diagnosis not present

## 2023-07-14 DIAGNOSIS — M9901 Segmental and somatic dysfunction of cervical region: Secondary | ICD-10-CM | POA: Diagnosis not present

## 2023-07-17 DIAGNOSIS — J310 Chronic rhinitis: Secondary | ICD-10-CM | POA: Diagnosis not present

## 2023-07-17 DIAGNOSIS — H6983 Other specified disorders of Eustachian tube, bilateral: Secondary | ICD-10-CM | POA: Diagnosis not present

## 2023-07-17 DIAGNOSIS — H903 Sensorineural hearing loss, bilateral: Secondary | ICD-10-CM | POA: Diagnosis not present

## 2023-07-23 DIAGNOSIS — D23121 Other benign neoplasm of skin of left upper eyelid, including canthus: Secondary | ICD-10-CM | POA: Diagnosis not present

## 2023-07-23 DIAGNOSIS — D23111 Other benign neoplasm of skin of right upper eyelid, including canthus: Secondary | ICD-10-CM | POA: Diagnosis not present

## 2023-07-29 ENCOUNTER — Other Ambulatory Visit

## 2023-07-29 DIAGNOSIS — Z87898 Personal history of other specified conditions: Secondary | ICD-10-CM

## 2023-07-30 ENCOUNTER — Encounter: Payer: Self-pay | Admitting: Urology

## 2023-07-30 LAB — PSA: Prostate Specific Ag, Serum: 2.8 ng/mL (ref 0.0–4.0)

## 2023-08-03 DIAGNOSIS — M62830 Muscle spasm of back: Secondary | ICD-10-CM | POA: Diagnosis not present

## 2023-08-03 DIAGNOSIS — M9903 Segmental and somatic dysfunction of lumbar region: Secondary | ICD-10-CM | POA: Diagnosis not present

## 2023-08-03 DIAGNOSIS — M955 Acquired deformity of pelvis: Secondary | ICD-10-CM | POA: Diagnosis not present

## 2023-08-03 DIAGNOSIS — M5136 Other intervertebral disc degeneration, lumbar region with discogenic back pain only: Secondary | ICD-10-CM | POA: Diagnosis not present

## 2023-08-03 DIAGNOSIS — M9901 Segmental and somatic dysfunction of cervical region: Secondary | ICD-10-CM | POA: Diagnosis not present

## 2023-08-03 DIAGNOSIS — M9902 Segmental and somatic dysfunction of thoracic region: Secondary | ICD-10-CM | POA: Diagnosis not present

## 2023-08-03 DIAGNOSIS — M9905 Segmental and somatic dysfunction of pelvic region: Secondary | ICD-10-CM | POA: Diagnosis not present

## 2023-08-11 DIAGNOSIS — M9901 Segmental and somatic dysfunction of cervical region: Secondary | ICD-10-CM | POA: Diagnosis not present

## 2023-08-11 DIAGNOSIS — M62830 Muscle spasm of back: Secondary | ICD-10-CM | POA: Diagnosis not present

## 2023-08-11 DIAGNOSIS — M5136 Other intervertebral disc degeneration, lumbar region with discogenic back pain only: Secondary | ICD-10-CM | POA: Diagnosis not present

## 2023-08-11 DIAGNOSIS — M9903 Segmental and somatic dysfunction of lumbar region: Secondary | ICD-10-CM | POA: Diagnosis not present

## 2023-08-11 DIAGNOSIS — M9905 Segmental and somatic dysfunction of pelvic region: Secondary | ICD-10-CM | POA: Diagnosis not present

## 2023-08-11 DIAGNOSIS — M955 Acquired deformity of pelvis: Secondary | ICD-10-CM | POA: Diagnosis not present

## 2023-08-11 DIAGNOSIS — M9902 Segmental and somatic dysfunction of thoracic region: Secondary | ICD-10-CM | POA: Diagnosis not present

## 2023-08-12 DIAGNOSIS — M62830 Muscle spasm of back: Secondary | ICD-10-CM | POA: Diagnosis not present

## 2023-08-12 DIAGNOSIS — M955 Acquired deformity of pelvis: Secondary | ICD-10-CM | POA: Diagnosis not present

## 2023-08-12 DIAGNOSIS — M9901 Segmental and somatic dysfunction of cervical region: Secondary | ICD-10-CM | POA: Diagnosis not present

## 2023-08-12 DIAGNOSIS — M9902 Segmental and somatic dysfunction of thoracic region: Secondary | ICD-10-CM | POA: Diagnosis not present

## 2023-08-12 DIAGNOSIS — M9905 Segmental and somatic dysfunction of pelvic region: Secondary | ICD-10-CM | POA: Diagnosis not present

## 2023-08-12 DIAGNOSIS — M9903 Segmental and somatic dysfunction of lumbar region: Secondary | ICD-10-CM | POA: Diagnosis not present

## 2023-08-12 DIAGNOSIS — M5136 Other intervertebral disc degeneration, lumbar region with discogenic back pain only: Secondary | ICD-10-CM | POA: Diagnosis not present

## 2023-08-13 DIAGNOSIS — M9901 Segmental and somatic dysfunction of cervical region: Secondary | ICD-10-CM | POA: Diagnosis not present

## 2023-08-13 DIAGNOSIS — M62830 Muscle spasm of back: Secondary | ICD-10-CM | POA: Diagnosis not present

## 2023-08-13 DIAGNOSIS — M9902 Segmental and somatic dysfunction of thoracic region: Secondary | ICD-10-CM | POA: Diagnosis not present

## 2023-08-13 DIAGNOSIS — M9903 Segmental and somatic dysfunction of lumbar region: Secondary | ICD-10-CM | POA: Diagnosis not present

## 2023-08-13 DIAGNOSIS — M5136 Other intervertebral disc degeneration, lumbar region with discogenic back pain only: Secondary | ICD-10-CM | POA: Diagnosis not present

## 2023-08-13 DIAGNOSIS — M955 Acquired deformity of pelvis: Secondary | ICD-10-CM | POA: Diagnosis not present

## 2023-08-13 DIAGNOSIS — M9905 Segmental and somatic dysfunction of pelvic region: Secondary | ICD-10-CM | POA: Diagnosis not present

## 2023-08-17 DIAGNOSIS — M5136 Other intervertebral disc degeneration, lumbar region with discogenic back pain only: Secondary | ICD-10-CM | POA: Diagnosis not present

## 2023-08-17 DIAGNOSIS — M9901 Segmental and somatic dysfunction of cervical region: Secondary | ICD-10-CM | POA: Diagnosis not present

## 2023-08-17 DIAGNOSIS — M62830 Muscle spasm of back: Secondary | ICD-10-CM | POA: Diagnosis not present

## 2023-08-17 DIAGNOSIS — M9905 Segmental and somatic dysfunction of pelvic region: Secondary | ICD-10-CM | POA: Diagnosis not present

## 2023-08-17 DIAGNOSIS — M955 Acquired deformity of pelvis: Secondary | ICD-10-CM | POA: Diagnosis not present

## 2023-08-17 DIAGNOSIS — M9902 Segmental and somatic dysfunction of thoracic region: Secondary | ICD-10-CM | POA: Diagnosis not present

## 2023-08-17 DIAGNOSIS — M9903 Segmental and somatic dysfunction of lumbar region: Secondary | ICD-10-CM | POA: Diagnosis not present

## 2023-08-20 DIAGNOSIS — M9902 Segmental and somatic dysfunction of thoracic region: Secondary | ICD-10-CM | POA: Diagnosis not present

## 2023-08-20 DIAGNOSIS — M62830 Muscle spasm of back: Secondary | ICD-10-CM | POA: Diagnosis not present

## 2023-08-20 DIAGNOSIS — M9901 Segmental and somatic dysfunction of cervical region: Secondary | ICD-10-CM | POA: Diagnosis not present

## 2023-08-20 DIAGNOSIS — M9903 Segmental and somatic dysfunction of lumbar region: Secondary | ICD-10-CM | POA: Diagnosis not present

## 2023-08-20 DIAGNOSIS — M9905 Segmental and somatic dysfunction of pelvic region: Secondary | ICD-10-CM | POA: Diagnosis not present

## 2023-08-20 DIAGNOSIS — M955 Acquired deformity of pelvis: Secondary | ICD-10-CM | POA: Diagnosis not present

## 2023-08-20 DIAGNOSIS — M5136 Other intervertebral disc degeneration, lumbar region with discogenic back pain only: Secondary | ICD-10-CM | POA: Diagnosis not present

## 2023-08-24 ENCOUNTER — Other Ambulatory Visit: Payer: Self-pay | Admitting: Urology

## 2023-08-24 DIAGNOSIS — N3943 Post-void dribbling: Secondary | ICD-10-CM

## 2023-08-26 DIAGNOSIS — M9905 Segmental and somatic dysfunction of pelvic region: Secondary | ICD-10-CM | POA: Diagnosis not present

## 2023-08-26 DIAGNOSIS — M62830 Muscle spasm of back: Secondary | ICD-10-CM | POA: Diagnosis not present

## 2023-08-26 DIAGNOSIS — M9903 Segmental and somatic dysfunction of lumbar region: Secondary | ICD-10-CM | POA: Diagnosis not present

## 2023-08-26 DIAGNOSIS — M9901 Segmental and somatic dysfunction of cervical region: Secondary | ICD-10-CM | POA: Diagnosis not present

## 2023-08-26 DIAGNOSIS — M955 Acquired deformity of pelvis: Secondary | ICD-10-CM | POA: Diagnosis not present

## 2023-08-26 DIAGNOSIS — M9902 Segmental and somatic dysfunction of thoracic region: Secondary | ICD-10-CM | POA: Diagnosis not present

## 2023-08-26 DIAGNOSIS — M5136 Other intervertebral disc degeneration, lumbar region with discogenic back pain only: Secondary | ICD-10-CM | POA: Diagnosis not present

## 2023-08-28 DIAGNOSIS — M955 Acquired deformity of pelvis: Secondary | ICD-10-CM | POA: Diagnosis not present

## 2023-08-28 DIAGNOSIS — M9903 Segmental and somatic dysfunction of lumbar region: Secondary | ICD-10-CM | POA: Diagnosis not present

## 2023-08-28 DIAGNOSIS — M9902 Segmental and somatic dysfunction of thoracic region: Secondary | ICD-10-CM | POA: Diagnosis not present

## 2023-08-28 DIAGNOSIS — M5136 Other intervertebral disc degeneration, lumbar region with discogenic back pain only: Secondary | ICD-10-CM | POA: Diagnosis not present

## 2023-08-28 DIAGNOSIS — M9905 Segmental and somatic dysfunction of pelvic region: Secondary | ICD-10-CM | POA: Diagnosis not present

## 2023-08-28 DIAGNOSIS — M62830 Muscle spasm of back: Secondary | ICD-10-CM | POA: Diagnosis not present

## 2023-08-28 DIAGNOSIS — M9901 Segmental and somatic dysfunction of cervical region: Secondary | ICD-10-CM | POA: Diagnosis not present

## 2023-09-02 DIAGNOSIS — M9902 Segmental and somatic dysfunction of thoracic region: Secondary | ICD-10-CM | POA: Diagnosis not present

## 2023-09-02 DIAGNOSIS — M9901 Segmental and somatic dysfunction of cervical region: Secondary | ICD-10-CM | POA: Diagnosis not present

## 2023-09-02 DIAGNOSIS — M955 Acquired deformity of pelvis: Secondary | ICD-10-CM | POA: Diagnosis not present

## 2023-09-02 DIAGNOSIS — M9903 Segmental and somatic dysfunction of lumbar region: Secondary | ICD-10-CM | POA: Diagnosis not present

## 2023-09-02 DIAGNOSIS — M9905 Segmental and somatic dysfunction of pelvic region: Secondary | ICD-10-CM | POA: Diagnosis not present

## 2023-09-02 DIAGNOSIS — M62830 Muscle spasm of back: Secondary | ICD-10-CM | POA: Diagnosis not present

## 2023-09-02 DIAGNOSIS — M5136 Other intervertebral disc degeneration, lumbar region with discogenic back pain only: Secondary | ICD-10-CM | POA: Diagnosis not present

## 2023-10-07 DIAGNOSIS — M9903 Segmental and somatic dysfunction of lumbar region: Secondary | ICD-10-CM | POA: Diagnosis not present

## 2023-10-07 DIAGNOSIS — M9902 Segmental and somatic dysfunction of thoracic region: Secondary | ICD-10-CM | POA: Diagnosis not present

## 2023-10-07 DIAGNOSIS — M9901 Segmental and somatic dysfunction of cervical region: Secondary | ICD-10-CM | POA: Diagnosis not present

## 2023-10-07 DIAGNOSIS — M9905 Segmental and somatic dysfunction of pelvic region: Secondary | ICD-10-CM | POA: Diagnosis not present

## 2023-10-07 DIAGNOSIS — M5136 Other intervertebral disc degeneration, lumbar region with discogenic back pain only: Secondary | ICD-10-CM | POA: Diagnosis not present

## 2023-10-07 DIAGNOSIS — M62830 Muscle spasm of back: Secondary | ICD-10-CM | POA: Diagnosis not present

## 2023-10-07 DIAGNOSIS — M955 Acquired deformity of pelvis: Secondary | ICD-10-CM | POA: Diagnosis not present

## 2023-11-06 DIAGNOSIS — I493 Ventricular premature depolarization: Secondary | ICD-10-CM | POA: Diagnosis not present

## 2023-11-06 DIAGNOSIS — E782 Mixed hyperlipidemia: Secondary | ICD-10-CM | POA: Diagnosis not present

## 2023-11-06 DIAGNOSIS — I10 Essential (primary) hypertension: Secondary | ICD-10-CM | POA: Diagnosis not present

## 2023-11-06 DIAGNOSIS — I2510 Atherosclerotic heart disease of native coronary artery without angina pectoris: Secondary | ICD-10-CM | POA: Diagnosis not present

## 2023-11-06 DIAGNOSIS — Z79899 Other long term (current) drug therapy: Secondary | ICD-10-CM | POA: Diagnosis not present

## 2023-11-06 DIAGNOSIS — I5022 Chronic systolic (congestive) heart failure: Secondary | ICD-10-CM | POA: Diagnosis not present

## 2023-11-09 DIAGNOSIS — I10 Essential (primary) hypertension: Secondary | ICD-10-CM | POA: Diagnosis not present

## 2023-11-09 DIAGNOSIS — E782 Mixed hyperlipidemia: Secondary | ICD-10-CM | POA: Diagnosis not present

## 2023-11-09 DIAGNOSIS — I2510 Atherosclerotic heart disease of native coronary artery without angina pectoris: Secondary | ICD-10-CM | POA: Diagnosis not present

## 2023-11-09 DIAGNOSIS — Z79891 Long term (current) use of opiate analgesic: Secondary | ICD-10-CM | POA: Diagnosis not present

## 2023-11-09 DIAGNOSIS — Z8551 Personal history of malignant neoplasm of bladder: Secondary | ICD-10-CM | POA: Diagnosis not present

## 2023-11-09 DIAGNOSIS — R7303 Prediabetes: Secondary | ICD-10-CM | POA: Diagnosis not present

## 2023-11-09 DIAGNOSIS — I700 Atherosclerosis of aorta: Secondary | ICD-10-CM | POA: Diagnosis not present

## 2023-11-10 ENCOUNTER — Encounter: Payer: Self-pay | Admitting: Urology

## 2023-11-16 DIAGNOSIS — I2510 Atherosclerotic heart disease of native coronary artery without angina pectoris: Secondary | ICD-10-CM | POA: Diagnosis not present

## 2023-11-16 DIAGNOSIS — Z0000 Encounter for general adult medical examination without abnormal findings: Secondary | ICD-10-CM | POA: Diagnosis not present

## 2023-11-16 DIAGNOSIS — I700 Atherosclerosis of aorta: Secondary | ICD-10-CM | POA: Diagnosis not present

## 2023-11-16 DIAGNOSIS — I10 Essential (primary) hypertension: Secondary | ICD-10-CM | POA: Diagnosis not present

## 2023-11-16 DIAGNOSIS — Z79891 Long term (current) use of opiate analgesic: Secondary | ICD-10-CM | POA: Diagnosis not present

## 2023-11-16 DIAGNOSIS — E559 Vitamin D deficiency, unspecified: Secondary | ICD-10-CM | POA: Diagnosis not present

## 2023-11-16 DIAGNOSIS — Z79899 Other long term (current) drug therapy: Secondary | ICD-10-CM | POA: Diagnosis not present

## 2023-11-16 DIAGNOSIS — R7303 Prediabetes: Secondary | ICD-10-CM | POA: Diagnosis not present

## 2023-11-16 DIAGNOSIS — I5022 Chronic systolic (congestive) heart failure: Secondary | ICD-10-CM | POA: Diagnosis not present

## 2023-11-16 DIAGNOSIS — F17210 Nicotine dependence, cigarettes, uncomplicated: Secondary | ICD-10-CM | POA: Diagnosis not present

## 2023-11-16 DIAGNOSIS — I7143 Infrarenal abdominal aortic aneurysm, without rupture: Secondary | ICD-10-CM | POA: Diagnosis not present

## 2023-11-16 DIAGNOSIS — Z8551 Personal history of malignant neoplasm of bladder: Secondary | ICD-10-CM | POA: Diagnosis not present

## 2023-11-16 DIAGNOSIS — Z1509 Genetic susceptibility to other malignant neoplasm: Secondary | ICD-10-CM | POA: Diagnosis not present

## 2023-11-16 DIAGNOSIS — E782 Mixed hyperlipidemia: Secondary | ICD-10-CM | POA: Diagnosis not present

## 2023-11-17 ENCOUNTER — Ambulatory Visit: Admitting: Dermatology

## 2023-11-17 ENCOUNTER — Encounter: Payer: Self-pay | Admitting: Dermatology

## 2023-11-17 DIAGNOSIS — M5136 Other intervertebral disc degeneration, lumbar region with discogenic back pain only: Secondary | ICD-10-CM | POA: Diagnosis not present

## 2023-11-17 DIAGNOSIS — M9905 Segmental and somatic dysfunction of pelvic region: Secondary | ICD-10-CM | POA: Diagnosis not present

## 2023-11-17 DIAGNOSIS — M9901 Segmental and somatic dysfunction of cervical region: Secondary | ICD-10-CM | POA: Diagnosis not present

## 2023-11-17 DIAGNOSIS — D485 Neoplasm of uncertain behavior of skin: Secondary | ICD-10-CM | POA: Diagnosis not present

## 2023-11-17 DIAGNOSIS — M955 Acquired deformity of pelvis: Secondary | ICD-10-CM | POA: Diagnosis not present

## 2023-11-17 DIAGNOSIS — D4819 Other specified neoplasm of uncertain behavior of connective and other soft tissue: Secondary | ICD-10-CM | POA: Diagnosis not present

## 2023-11-17 DIAGNOSIS — M9902 Segmental and somatic dysfunction of thoracic region: Secondary | ICD-10-CM | POA: Diagnosis not present

## 2023-11-17 DIAGNOSIS — M62830 Muscle spasm of back: Secondary | ICD-10-CM | POA: Diagnosis not present

## 2023-11-17 DIAGNOSIS — M9903 Segmental and somatic dysfunction of lumbar region: Secondary | ICD-10-CM | POA: Diagnosis not present

## 2023-11-17 DIAGNOSIS — C4489 Other specified malignant neoplasm of overlapping sites of skin: Secondary | ICD-10-CM | POA: Diagnosis not present

## 2023-11-17 NOTE — Patient Instructions (Addendum)

## 2023-11-17 NOTE — Progress Notes (Signed)
   Follow-Up Visit   Subjective  Jeffrey Buchanan is a 68 y.o. male who presents for the following: Lesion under the L lower eyelid x 3 weeks, not itchy or irritated, but new and irregular appearing. Pt has a hx of Lynch syndrome.  The following portions of the chart were reviewed this encounter and updated as appropriate: medications, allergies, medical history  Review of Systems:  No other skin or systemic complaints except as noted in HPI or Assessment and Plan.  Objective  Well appearing patient in no apparent distress; mood and affect are within normal limits.   A focused examination was performed of the following areas: the face   Relevant exam findings are noted in the Assessment and Plan.  L infra orbital 0.3 cm pink keratotic papule   Assessment & Plan   NEOPLASM OF UNCERTAIN BEHAVIOR OF SKIN L infra orbital Skin / nail biopsy Type of biopsy: tangential   Informed consent: discussed and consent obtained   Timeout: patient name, date of birth, surgical site, and procedure verified   Procedure prep:  Patient was prepped and draped in usual sterile fashion Prep type:  Isopropyl alcohol  Anesthesia: the lesion was anesthetized in a standard fashion   Anesthetic:  1% lidocaine  w/ epinephrine 1-100,000 buffered w/ 8.4% NaHCO3 Instrument used: flexible razor blade   Hemostasis achieved with: pressure, aluminum chloride and electrodesiccation   Outcome: patient tolerated procedure well   Post-procedure details: sterile dressing applied and wound care instructions given   Dressing type: bandage (Mupirocin 2% ointment)    Specimen 1 - Surgical pathology Differential Diagnosis: D48.5 r/o verruca vs SCC vs sebaceous adenoma Two pieces Check Margins: No  Return for appointment as scheduled.  LILLETTE Rosina Mayans, CMA, am acting as scribe for Boneta Sharps, MD .  Documentation: I have reviewed the above documentation for accuracy and completeness, and I agree with the  above.  Boneta Sharps, MD

## 2023-11-20 LAB — SURGICAL PATHOLOGY

## 2023-11-26 ENCOUNTER — Ambulatory Visit: Payer: Self-pay | Admitting: Dermatology

## 2023-11-26 DIAGNOSIS — D492 Neoplasm of unspecified behavior of bone, soft tissue, and skin: Secondary | ICD-10-CM

## 2023-11-26 NOTE — Telephone Encounter (Signed)
-----   Message from [place] sent at 11/26/2023  1:39 PM EDT ----- Diagnosis: L infra orbital :       SEBACEOUS NEOPLASM WITH ATYPICAL FEATURES, BASE INVOLVED, SEE DESCRIPTION   Plan: please call to share that biopsy shows atypical overgrowth of oil glands. Most likely caused by patient's Lynch syndrome. We recommend a consult with Dr Corey to discuss surgical options for  complete removal. Please refer to Dr Corey. Follow up with Dr Jackquline already scheduled ----- Message ----- From: Corey Rufus HERO, MD Sent: 11/23/2023   8:18 PM EDT To: Boneta Sharps, MD Subject: RE: Excision vs Mohs                           Walterine Derry,   If they have Leodis, I would see them in consult probably and decide with the patient. Are they amenable to that? With Lynch I would want to make sure there's no carcinoma features deep to the biopsy  margin, so Mohs is highly likely based on tumor, history, and location.   KP ----- Message ----- From: Sharps Boneta, MD Sent: 11/23/2023   6:50 PM EDT To: Rufus HERO Corey, MD Subject: Excision vs Mohs                               Hi Karina,  Hope you're doing well. Based on this path, is this a tumour you would excise or do Mohs on? Patient has MLH1 Lynch syndrome.  Thank you, Derry ----- Message ----- From: Interface, [facility] Sent: 11/20/2023   6:27 PM EDT To: Boneta Sharps, MD

## 2023-11-26 NOTE — Telephone Encounter (Signed)
 Pathology results reviewed with patient, referral sent to Dr. Paci for consult, possible Mohs. Lonell RAMAN., RMA

## 2023-11-30 ENCOUNTER — Telehealth: Payer: Self-pay

## 2023-11-30 NOTE — Telephone Encounter (Signed)
 Patient left a voicemail asking if he could be referred to Dr. Bluford instead of Dr. Corey since his wife and son have had excellent results with him. Please advise.

## 2023-12-03 NOTE — Telephone Encounter (Signed)
 Advised patient that referral was sent to Dr. Metta office (emailed). I was going to contact Dr. Armando office to schedule his appointment, but he asked me not to. He would like to keep the appointment with Dr. Corey in case Dr. Bluford can't see him in a timely manner.

## 2023-12-04 DIAGNOSIS — I493 Ventricular premature depolarization: Secondary | ICD-10-CM | POA: Diagnosis not present

## 2023-12-04 DIAGNOSIS — I5022 Chronic systolic (congestive) heart failure: Secondary | ICD-10-CM | POA: Diagnosis not present

## 2023-12-04 DIAGNOSIS — I10 Essential (primary) hypertension: Secondary | ICD-10-CM | POA: Diagnosis not present

## 2023-12-04 DIAGNOSIS — E782 Mixed hyperlipidemia: Secondary | ICD-10-CM | POA: Diagnosis not present

## 2023-12-04 DIAGNOSIS — I2510 Atherosclerotic heart disease of native coronary artery without angina pectoris: Secondary | ICD-10-CM | POA: Diagnosis not present

## 2023-12-14 ENCOUNTER — Encounter: Payer: Self-pay | Admitting: Dermatology

## 2023-12-14 ENCOUNTER — Ambulatory Visit: Admitting: Dermatology

## 2023-12-14 VITALS — BP 136/68 | HR 66

## 2023-12-14 DIAGNOSIS — Z8509 Personal history of malignant neoplasm of other digestive organs: Secondary | ICD-10-CM | POA: Diagnosis not present

## 2023-12-14 DIAGNOSIS — Z1509 Genetic susceptibility to other malignant neoplasm: Secondary | ICD-10-CM | POA: Diagnosis not present

## 2023-12-14 DIAGNOSIS — D2339 Other benign neoplasm of skin of other parts of face: Secondary | ICD-10-CM

## 2023-12-14 DIAGNOSIS — D485 Neoplasm of uncertain behavior of skin: Secondary | ICD-10-CM

## 2023-12-14 NOTE — Patient Instructions (Signed)

## 2023-12-14 NOTE — Progress Notes (Signed)
   Follow-Up Visit   Subjective  [name] is a 68 y.o. male who presents for the following: consult. Sebaceous adenoma biopsied by Dr. Claudene. Previous history of sebaceous adenomas with proven MLH1 mutation [name], a subset of Lynch syndrome. Patient has not had a colonoscopy since 2018. He endorses a hx of colon cancer and states that his brother also have Lynch syndrome.   The following portions of the chart were reviewed this encounter and updated as appropriate: medications, allergies, medical history  Review of Systems:  No other skin or systemic complaints except as noted in HPI or Assessment and Plan.  Objective  Well appearing patient in no apparent distress; mood and affect are within normal limits.  A focused examination was performed of the following areas: left infraorbital  Relevant exam findings are noted in the Assessment and Plan.    Assessment & Plan   [name] and Sebaceous Adenoma with pleomorphism on left cheek  Treatment Plan: Mohs  Patient was counseled about Muir-Torre syndrome, a variant of Lynch syndrome, confirmed by the presence of an MLH1 mutation. Explained that Muir-Torre syndrome is characterized by the development of sebaceous skin tumors and internal malignancies, most commonly colorectal cancer, which is linked to the underlying mismatch repair gene mutation seen in Lynch syndrome. The recently biopsied lesion on the left cheek was a sebaceous adenoma with atypia and mitotic figures, consistent with Muir-Torre syndrome's cutaneous manifestations. Due to the increased risk of colorectal and other Lynch-associated cancers, recommended regular colonoscopies and follow-up with gastroenterology for ongoing surveillance. Also advised proceeding with Mohs micrographic surgery for the left cheek lesion to ensure complete removal given the abnormal pathology findings. Emphasized the importance of vigilant cancer screening and  multidisciplinary care for optimal management.  The patient was counseled thoroughly regarding the plan for Mohs micrographic surgery to treat their skin cancer. We discussed the procedure in detail, including the removal of thin layers of tissue for examination under a microscope to ensure complete excision of the cancer while preserving as much healthy tissue as possible. The patient was informed that the surgery may take several hours, with potential additional stages if further tissue removal is necessary to achieve clear margins. We reviewed the risks, which include infection, scarring, and the possibility of requiring reconstructive techniques depending on the location and size of the defect. Reconstruction will occur after the tumor is fully cleared, and depending on the wound size and location, this may involve a flap, graft, linear closure, or sometimes healing by secondary intention. The patient was advised on preoperative and postoperative care, including avoiding certain medications, managing pain, and caring for the surgical site to prevent complications. They were given an opportunity to ask questions, and all concerns were addressed. The patient expressed understanding and agreed to proceed with the scheduled Mohs surgery.   MUIR-TORRE SYNDROME   Related Procedures Ambulatory referral to Gastroenterology  Return for mohs sebaseous adenoma of left infraorbital tier 2.  I, Darice Smock, CMA, am acting as scribe for [name], MD.   Documentation: I have reviewed the above documentation for accuracy and completeness, and I agree with the above.  [name], MD

## 2023-12-15 DIAGNOSIS — M9903 Segmental and somatic dysfunction of lumbar region: Secondary | ICD-10-CM | POA: Diagnosis not present

## 2023-12-15 DIAGNOSIS — M9901 Segmental and somatic dysfunction of cervical region: Secondary | ICD-10-CM | POA: Diagnosis not present

## 2023-12-15 DIAGNOSIS — M955 Acquired deformity of pelvis: Secondary | ICD-10-CM | POA: Diagnosis not present

## 2023-12-15 DIAGNOSIS — M9902 Segmental and somatic dysfunction of thoracic region: Secondary | ICD-10-CM | POA: Diagnosis not present

## 2023-12-15 DIAGNOSIS — M9905 Segmental and somatic dysfunction of pelvic region: Secondary | ICD-10-CM | POA: Diagnosis not present

## 2023-12-15 DIAGNOSIS — M62830 Muscle spasm of back: Secondary | ICD-10-CM | POA: Diagnosis not present

## 2023-12-15 DIAGNOSIS — M5136 Other intervertebral disc degeneration, lumbar region with discogenic back pain only: Secondary | ICD-10-CM | POA: Diagnosis not present

## 2024-01-01 DIAGNOSIS — I493 Ventricular premature depolarization: Secondary | ICD-10-CM | POA: Diagnosis not present

## 2024-01-01 DIAGNOSIS — I2510 Atherosclerotic heart disease of native coronary artery without angina pectoris: Secondary | ICD-10-CM | POA: Diagnosis not present

## 2024-01-01 DIAGNOSIS — I447 Left bundle-branch block, unspecified: Secondary | ICD-10-CM | POA: Diagnosis not present

## 2024-01-01 DIAGNOSIS — I10 Essential (primary) hypertension: Secondary | ICD-10-CM | POA: Diagnosis not present

## 2024-01-01 DIAGNOSIS — I5022 Chronic systolic (congestive) heart failure: Secondary | ICD-10-CM | POA: Diagnosis not present

## 2024-01-01 DIAGNOSIS — E782 Mixed hyperlipidemia: Secondary | ICD-10-CM | POA: Diagnosis not present

## 2024-01-04 ENCOUNTER — Encounter: Payer: Self-pay | Admitting: Dermatology

## 2024-01-11 ENCOUNTER — Encounter: Admitting: Dermatology

## 2024-01-13 DIAGNOSIS — M9901 Segmental and somatic dysfunction of cervical region: Secondary | ICD-10-CM | POA: Diagnosis not present

## 2024-01-13 DIAGNOSIS — M9903 Segmental and somatic dysfunction of lumbar region: Secondary | ICD-10-CM | POA: Diagnosis not present

## 2024-01-13 DIAGNOSIS — M62830 Muscle spasm of back: Secondary | ICD-10-CM | POA: Diagnosis not present

## 2024-01-13 DIAGNOSIS — M9902 Segmental and somatic dysfunction of thoracic region: Secondary | ICD-10-CM | POA: Diagnosis not present

## 2024-01-13 DIAGNOSIS — M9905 Segmental and somatic dysfunction of pelvic region: Secondary | ICD-10-CM | POA: Diagnosis not present

## 2024-01-13 DIAGNOSIS — M5136 Other intervertebral disc degeneration, lumbar region with discogenic back pain only: Secondary | ICD-10-CM | POA: Diagnosis not present

## 2024-01-13 DIAGNOSIS — M955 Acquired deformity of pelvis: Secondary | ICD-10-CM | POA: Diagnosis not present

## 2024-02-02 ENCOUNTER — Ambulatory Visit: Admitting: Urology

## 2024-02-02 VITALS — BP 132/73 | HR 96 | Ht 74.0 in | Wt 208.0 lb

## 2024-02-02 DIAGNOSIS — N3943 Post-void dribbling: Secondary | ICD-10-CM

## 2024-02-02 DIAGNOSIS — N401 Enlarged prostate with lower urinary tract symptoms: Secondary | ICD-10-CM

## 2024-02-03 DIAGNOSIS — N179 Acute kidney failure, unspecified: Secondary | ICD-10-CM | POA: Diagnosis not present

## 2024-02-05 ENCOUNTER — Encounter: Payer: Self-pay | Admitting: Urology

## 2024-02-05 NOTE — Progress Notes (Signed)
 02/02/2024 4:52 PM   Jeffrey Buchanan [birth year] [id number]  Referring provider: Fernande Ophelia JINNY DOUGLAS, MD [address] [facility] [place],  [place] [address]  Chief Complaint  Patient presents with   Other   Urologic history:  1.  History of bladder cancer TURBT 09/2012; path Ta LG urothelial carcinoma Bladder biopsy 09/2013; path benign  2.  BPH with LUTS Onset LUTS 2017 Started tamsulosin  11/2015 Finasteride  since at least 2022 Cystoscopy 02/2016 described as 7 cm visually obstructive prostate Cystoscopy 05/2023: Prominent trilobar enlargement; moderate-severe bladder trabeculation PVR 05/2023: 0 mL CT 05/2023: Prostate volume calculated 143 cc  3.  History of bladder calculi Cystolitholapaxy/evacuation bladder calculi 06/2019 Cystolitholapaxy 01/2021 Cystolitholapaxy 12/2022  4.  Recurrent nephrolithiasis Uric acid 100% Metabolic evaluation 02/2024; 24-hour urine study: Low urine volume 1330 mL, hypocitraturia, low urine pH, elevated sodium.  Serum uric acid normal Left ureteroscopic stone removal 12/2022 Left ureteroscopic stone removal 05/2023   HPI: Jeffrey Buchanan is a 68 y.o. male presents today to discuss malic procedures.  40-month history of worsening lower urinary tract symptoms which have become bothersome enough that he desires to pursue outlet surgery Presently complains of urinary frequency, urgency, nocturia x 4, weak urinary stream and postvoid dribbling He brought in a list today of several procedures to discuss including TUMT, Rezum, Aquablation, UroLift, PAE, HoLEP and thulium laser nucleation    PMH: Past Medical History:  Diagnosis Date   Acute MI, inferior wall (HCC) 04/06/2003   a.) PCI 04/10/2003 --> 90% pRCA --> 3.5 x 18 mm and 4.0 x 23 mm Vision stents to RCA.   Acute pyelonephritis 12/19/2022   Anxiety    Arthritis    BPH (benign prostatic hyperplasia)    Chronic anticoagulation    a.) DAPT therapy (ASA + clopidogrel)    Chronic back pain    Coronary artery disease 04/10/2003   a.) LHC 04/10/2003 --> EF 40%; 90% pRCA, 40% x 2 mRCA, 30% and 20% pLCx; 3.5 x 18 mm and 4.0 x 23 mm Vision stents to RCA.   DJD (degenerative joint disease)    GERD (gastroesophageal reflux disease)    Headache    History of adenomatous polyp of colon    S/P  PARTIAL COLECTOMY 2006   History of kidney stones    HLD (hyperlipidemia)    Hyperlipidemia    Hypertension    Left ureteral stone 12/19/2022   MLH1-related Lynch syndrome (HNPCC2) 12/18/2022   Renal disorder    Skin cancer    Urothelial carcinoma of bladder (HCC) 09/14/2012   a.) Bx 09/14/2012 --> pathology (+) for low grade papillary urothelial carcinoma.   Vitamin D deficiency     Surgical History: Past Surgical History:  Procedure Laterality Date   COLONOSCOPY WITH PROPOFOL  N/A 03/04/2017   Procedure: COLONOSCOPY WITH PROPOFOL ;  Surgeon: Toledo, Ladell POUR, MD;  Location: [facility];  Service: Gastroenterology;  Laterality: N/A;   CORONARY ANGIOPLASTY WITH STENT PLACEMENT Left 04/10/2003   Procedure: CORONARY ANGIOPLASTY WITH STENT PLACEMENT (3.5 x 18 mm and 4.0 x 23 mm Vision stents to RCA); Location: [facility]; Surgeon: Wolm Rhyme, MD   CYSTOSCOPY W/ RETROGRADES Bilateral 09/26/2013   Procedure: CYSTOSCOPY WITH BILATERAL RETROGRADE PYELOGRAM;  Surgeon: Toribio CINDERELLA Repine, MD;  [facility];  Service: Urology;  Laterality: Bilateral;   CYSTOSCOPY WITH LITHOLAPAXY N/A 06/13/2019   Procedure: CYSTOSCOPY;  Surgeon: Penne Knee, MD;  Location: [facility];  Service: Urology;  Laterality: N/A;   CYSTOSCOPY WITH  LITHOLAPAXY N/A 01/28/2021   Procedure: CYSTOSCOPY WITH LITHOLAPAXY;  Surgeon: Penne Knee, MD;  Location: [facility];  Service: Urology;  Laterality: N/A;   CYSTOSCOPY WITH LITHOLAPAXY N/A 12/20/2022   Procedure: CYSTOSCOPY WITH LITHOLAPAXY for Bladder [place];  Surgeon: Twylla Glendia BROCKS, MD;  Location: [facility];  Service: Urology;   Laterality: N/A;   CYSTOSCOPY/URETEROSCOPY/HOLMIUM LASER/STENT PLACEMENT Left 12/20/2022   Procedure: CYSTOSCOPY/URETEROSCOPY/HOLMIUM LASER/STENT PLACEMENT;  Surgeon: Twylla Glendia BROCKS, MD;  Location: [facility];  Service: Urology;  Laterality: Left;   CYSTOSCOPY/URETEROSCOPY/HOLMIUM LASER/STENT PLACEMENT Left 05/26/2023   Procedure: CYSTOSCOPY/URETEROSCOPY/HOLMIUM LASER/STENT PLACEMENT;  Surgeon: Twylla Glendia BROCKS, MD;  Location: [facility];  Service: Urology;  Laterality: Left;   ESOPHAGOGASTRODUODENOSCOPY (EGD) WITH PROPOFOL  N/A 03/04/2017   Procedure: ESOPHAGOGASTRODUODENOSCOPY (EGD) WITH PROPOFOL ;  Surgeon: Toledo, Ladell POUR, MD;  Location: [facility];  Service: Gastroenterology;  Laterality: N/A;   OPEN REDUCTION SHOULDER DISLOCATION  04/07/1974   PARTIAL COLECTOMY  04/07/2004   STONE EXTRACTION WITH BASKET N/A 06/13/2019   Procedure: STONE EXTRACTION WITH BASKET;  Surgeon: Penne Knee, MD;  Location: [facility];  Service: Urology;  Laterality: N/A;   TRANSURETHRAL RESECTION OF BLADDER TUMOR N/A 2014   TRANSURETHRAL RESECTION OF BLADDER TUMOR WITH GYRUS (TURBT-GYRUS) N/A 09/26/2013   Procedure: BLADDER BIOPSY;  Surgeon: Toribio CINDERELLA Repine, MD;  Location: [facility];  Service: Urology;  Laterality: N/A;    Home Medications:  Allergies as of 02/02/2024       Reactions   Cephalosporins Diarrhea   Escitalopram Oxalate Other (See Comments)   lethargic        Medication List        Accurate as of February 02, 2024 11:59 PM. If you have any questions, ask your nurse or doctor.          STOP taking these medications    hydrochlorothiazide 12.5 MG capsule Commonly known as: MICROZIDE Stopped by: Glendia BROCKS Twylla       TAKE these medications    ALPRAZolam  0.5 MG tablet Commonly known as: XANAX  Take 0.5 mg by mouth 2 (two) times daily as needed for sleep or anxiety.   amiodarone  200 MG tablet Commonly known as: PACERONE  Take 200 mg by mouth at bedtime.    aspirin  EC 81 MG tablet Take 81 mg by mouth at bedtime.   clopidogrel 75 MG tablet Commonly known as: PLAVIX Take 75 mg by mouth at bedtime.   docusate sodium  100 MG capsule Commonly known as: COLACE Take 100 mg by mouth 2 (two) times daily as needed for mild constipation or moderate constipation.   empagliflozin 10 MG Tabs tablet Commonly known as: JARDIANCE Take 10 mg by mouth daily.   Entresto 24-26 MG Generic drug: sacubitril-valsartan Take 1 tablet by mouth every 12 (twelve) hours.   finasteride  5 MG tablet Commonly known as: PROSCAR  TAKE 1 TABLET BY MOUTH EVERY DAY   HYDROcodone -acetaminophen  5-325 MG tablet Commonly known as: NORCO/VICODIN Take 1 tablet by mouth every 6 (six) hours as needed for moderate pain.   metoprolol  succinate 50 MG 24 hr tablet Commonly known as: TOPROL -XL Take 50 mg by mouth every evening. Take with or immediately following a meal.   Potassium Citrate  15 MEQ (1620 MG) Tbcr Take 1 tablet by mouth 2 (two) times daily with a meal. TAKE 1 TABLET BY MOUTH IN THE MORNING AND IN THE EVENING   rosuvastatin  40 MG tablet Commonly known as: CRESTOR  Take 40 mg by mouth at bedtime.   tamsulosin  0.4 MG Caps capsule Commonly  known as: FLOMAX  TAKE 2 CAPSULES BY MOUTH AT BEDTIME.   Vitamin D3 50 MCG (2000 UT) Tabs Take 2,000 Units by mouth daily.        Allergies:  Allergies  Allergen Reactions   Cephalosporins Diarrhea   Escitalopram Oxalate Other (See Comments)    lethargic    Family History: Family History  Problem Relation Age of Onset   Bladder Cancer Mother        dx 77s   Prostate cancer Brother        dx 80s   Cancer Maternal Grandmother        unk type   Kidney disease Neg Hx     Social History:  reports that he has been smoking cigarettes. He has a 32 pack-year smoking history. He has never used smokeless tobacco. He reports that he does not drink alcohol  and does not use drugs.   Physical Exam: BP 132/73   Pulse 96    Ht 6' 2 (1.88 m)   Wt 208 lb (94.3 kg)   BMI 26.71 kg/m   Constitutional:  Alert, No acute distress. HEENT: [place] AT Respiratory: Normal respiratory effort, no increased work of breathing. Psychiatric: Normal mood and affect.   Assessment & Plan:    1.  BPH with LUTS Long history of lower urinary tract symptoms and significant BPH I discussed the various procedures he had written down today.  We discussed TUMT is an outdated procedure and no longer commonly performed.  He would not be a candidate for UroLift based on his prostate size.  We discussed Rezum and Aquablation could be performed however he would need to be seen in [place].  He was interested in staying in [facility] and we discussed that HoLEP and PAE are both performed locally.  I am not aware of any one in the area performing thulium laser ablation. Based on his prostate size I personally feel HoLEP would be his best option and he was interested in pursuing. Will schedule with Dr. Francisca and he will see him preoperatively for further discussion  I spent 45 total minutes on the day of the encounter including pre-visit review of the medical record, face-to-face time with the patient, and post visit ordering of labs/imaging/tests.   Glendia JAYSON Barba, MD  [facility] [address], [address] [place], [place] [address] [phone]

## 2024-02-10 ENCOUNTER — Encounter: Payer: Self-pay | Admitting: Urology

## 2024-02-15 ENCOUNTER — Other Ambulatory Visit: Payer: Self-pay | Admitting: Urology

## 2024-02-15 ENCOUNTER — Other Ambulatory Visit: Payer: Self-pay

## 2024-02-15 DIAGNOSIS — N138 Other obstructive and reflux uropathy: Secondary | ICD-10-CM

## 2024-02-15 DIAGNOSIS — N3943 Post-void dribbling: Secondary | ICD-10-CM

## 2024-02-15 NOTE — Progress Notes (Unsigned)
 Surgical Physician Order Form [facility]  Dr. Redell Burnet, MD  * Scheduling expectation : Next Available [place])  *Length of Case: 90 minutes  *Clearance needed: yes  *Anticoagulation Instructions: N/A  *Aspirin  Instructions: Hold Aspirin  and Plavix  *Post-op visit Date/Instructions: Postop day 3 cath removal  *Diagnosis: BPH w/urinary obstruction  *Procedure:  HOLEP (47350)   Additional orders: N/A  -Admit type: OUTpatient  -Anesthesia: General  -VTE Prophylaxis Standing Order SCD's       Other:   -Standing Lab Orders Per Anesthesia    Lab other: UA&Urine Culture  -Standing Test orders EKG/Chest x-ray per Anesthesia       Test other:   - Medications:  Ancef  2gm IV  -Other orders: Needs preop appointment with Burnet

## 2024-02-16 DIAGNOSIS — M62830 Muscle spasm of back: Secondary | ICD-10-CM | POA: Diagnosis not present

## 2024-02-16 DIAGNOSIS — M5136 Other intervertebral disc degeneration, lumbar region with discogenic back pain only: Secondary | ICD-10-CM | POA: Diagnosis not present

## 2024-02-16 DIAGNOSIS — M9903 Segmental and somatic dysfunction of lumbar region: Secondary | ICD-10-CM | POA: Diagnosis not present

## 2024-02-16 DIAGNOSIS — M9902 Segmental and somatic dysfunction of thoracic region: Secondary | ICD-10-CM | POA: Diagnosis not present

## 2024-02-17 ENCOUNTER — Telehealth: Payer: Self-pay

## 2024-02-17 DIAGNOSIS — H35341 Macular cyst, hole, or pseudohole, right eye: Secondary | ICD-10-CM | POA: Diagnosis not present

## 2024-02-17 DIAGNOSIS — H3581 Retinal edema: Secondary | ICD-10-CM | POA: Diagnosis not present

## 2024-02-17 DIAGNOSIS — H2513 Age-related nuclear cataract, bilateral: Secondary | ICD-10-CM | POA: Diagnosis not present

## 2024-02-17 NOTE — Progress Notes (Signed)
  Phone Number: [phone] for Surgical Coordinator Fax Number: [phone]  REQUEST FOR SURGICAL CLEARANCE      Date: Date: 02/17/2024  Faxed to: Dr. Wilburn, MD  Surgeon: Dr. Redell Burnet, MD     Date of Surgery: 03/18/2024  Operation:  Holmium Laser Enucleation of the Prostate   Anesthesia Type: General   Diagnosis: Benign Prostatic Hyperplasia with Urinary Obstruction  Patient Requires:   Cardiac / Vascular Clearance : Yes  Reason: Will need patient to hold Plavix x 5 Days and ASA    Risk Assessment:    Low   []       Moderate   []     High   []           This patient is optimized for surgery  YES []       NO   []    I recommend further assessment/workup prior to surgery. YES []      NO  []   Appointment scheduled for: _______________________   Further recommendations: ____________________________________     Physician Signature:__________________________________   Printed Name: ________________________________________   Date: _________________

## 2024-02-17 NOTE — Telephone Encounter (Signed)
 Per Dr. Francisca, Patient is to be scheduled for Holmium Laser Enucleation of the Prostate   Jeffrey Buchanan was contacted and possible surgical dates were discussed, Friday December 12th, 2025 was agreed upon for surgery.   Patient was instructed that Dr. Francisca will require them to provide a pre-op UA & CX prior to surgery. This was ordered and scheduled drop off appointment was made for 03/01/2024.    Patient was directed to call [phone] between 1-3pm the day before surgery to find out surgical arrival time.  Instructions were given not to eat or drink from midnight on the night before surgery and have a driver for the day of surgery. On the surgery day patient was instructed to enter through the [facility] entrance of [facility] report the Same Day Surgery desk.   Pre-Admit Testing will be in contact via phone to set up an interview with the anesthesia team to review your history and medications prior to surgery.   Reminder of this information was sent via MyChart/Mail to the patient.

## 2024-02-17 NOTE — Progress Notes (Signed)
   [facility] Posting Form  Surgery Date: Date: 03/18/2024  Surgeon: Dr. Redell Burnet, MD  Inpt ( No  )   Outpt (Yes)   Obs ( No  )   Diagnosis: N40.1, N13.8 Benign Prostatic Hyperplasia with Urinary Obstruction  -CPT: 47350  Surgery: Holmium Laser Enucleation of the Prostate  Stop Anticoagulations: Yes, will need to hold Plavix and ASA  Cardiac/Medical/Pulmonary Clearance needed: Yes  Clearance needed from Dr: Wilburn with [facility]  Clearance request sent on: Date: 02/17/24  *Orders entered into EPIC  Date: 02/17/24   *Case booked in [place]  Date: 02/17/24  *Notified pt of Surgery: Date: 02/17/24  PRE-OP UA & CX: yes, will obtain in clinic 03/01/2024  *Placed into Prior Authorization Work [place] Date: 02/17/24  Assistant/laser/rep:No

## 2024-02-22 ENCOUNTER — Ambulatory Visit: Admitting: Dermatology

## 2024-02-22 DIAGNOSIS — L578 Other skin changes due to chronic exposure to nonionizing radiation: Secondary | ICD-10-CM

## 2024-02-22 DIAGNOSIS — L0100 Impetigo, unspecified: Secondary | ICD-10-CM

## 2024-02-22 DIAGNOSIS — L821 Other seborrheic keratosis: Secondary | ICD-10-CM | POA: Diagnosis not present

## 2024-02-22 DIAGNOSIS — L300 Nummular dermatitis: Secondary | ICD-10-CM | POA: Diagnosis not present

## 2024-02-22 DIAGNOSIS — D229 Melanocytic nevi, unspecified: Secondary | ICD-10-CM

## 2024-02-22 DIAGNOSIS — W908XXA Exposure to other nonionizing radiation, initial encounter: Secondary | ICD-10-CM

## 2024-02-22 DIAGNOSIS — Z1283 Encounter for screening for malignant neoplasm of skin: Secondary | ICD-10-CM

## 2024-02-22 DIAGNOSIS — H61002 Unspecified perichondritis of left external ear: Secondary | ICD-10-CM

## 2024-02-22 DIAGNOSIS — L820 Inflamed seborrheic keratosis: Secondary | ICD-10-CM

## 2024-02-22 DIAGNOSIS — D485 Neoplasm of uncertain behavior of skin: Secondary | ICD-10-CM | POA: Diagnosis not present

## 2024-02-22 DIAGNOSIS — L814 Other melanin hyperpigmentation: Secondary | ICD-10-CM | POA: Diagnosis not present

## 2024-02-22 DIAGNOSIS — D225 Melanocytic nevi of trunk: Secondary | ICD-10-CM

## 2024-02-22 DIAGNOSIS — L905 Scar conditions and fibrosis of skin: Secondary | ICD-10-CM

## 2024-02-22 DIAGNOSIS — D1801 Hemangioma of skin and subcutaneous tissue: Secondary | ICD-10-CM

## 2024-02-22 MED ORDER — MUPIROCIN 2 % EX OINT: TOPICAL_OINTMENT | CUTANEOUS | 1 refills | Status: AC

## 2024-02-22 MED ORDER — TRIAMCINOLONE ACETONIDE 0.1 % EX CREA: TOPICAL_CREAM | CUTANEOUS | 1 refills | Status: AC

## 2024-02-22 NOTE — Patient Instructions (Addendum)
 Cryotherapy Aftercare  Wash gently with soap and water  everyday.   Apply Vaseline and Band-Aid daily until healed.   Wound Care Instructions  Cleanse wound gently with soap and water  once a day then pat dry with clean gauze. Apply a thin coat of Petrolatum (petroleum jelly, Vaseline) over the wound (unless you have an allergy to this). We recommend that you use a new, sterile tube of Vaseline. Do not pick or remove scabs. Do not remove the yellow or white healing tissue from the base of the wound.  Cover the wound with fresh, clean, nonstick gauze and secure with paper tape. You may use Band-Aids in place of gauze and tape if the wound is small enough, but would recommend trimming much of the tape off as there is often too much. Sometimes Band-Aids can irritate the skin.  You should call the office for your biopsy report after 1 week if you have not already been contacted.  If you experience any problems, such as abnormal amounts of bleeding, swelling, significant bruising, significant pain, or evidence of infection, please call the office immediately.  FOR ADULT SURGERY PATIENTS: If you need something for pain relief you may take 1 extra strength Tylenol  (acetaminophen ) AND 2 Ibuprofen (200mg  each) together every 4 hours as needed for pain. (do not take these if you are allergic to them or if you have a reason you should not take them.) Typically, you may only need pain medication for 1 to 3 days.     Topical steroids (such as triamcinolone , fluocinolone, fluocinonide, mometasone, clobetasol, halobetasol, betamethasone, hydrocortisone) can cause thinning and lightening of the skin if they are used for too long in the same area. Your physician has selected the right strength medicine for your problem and area affected on the body. Please use your medication only as directed by your physician to prevent side effects.   Due to recent changes in healthcare laws, you may see results of your  pathology and/or laboratory studies on MyChart before the doctors have had a chance to review them. We understand that in some cases there may be results that are confusing or concerning to you. Please understand that not all results are received at the same time and often the doctors may need to interpret multiple results in order to provide you with the best plan of care or course of treatment. Therefore, we ask that you please give us  2 business days to thoroughly review all your results before contacting the office for clarification. Should we see a critical lab result, you will be contacted sooner.   If You Need Anything After Your Visit  If you have any questions or concerns for your doctor, please call our main line at [phone] and press option 4 to reach your doctor's medical assistant. If no one answers, please leave a voicemail as directed and we will return your call as soon as possible. Messages left after 4 pm will be answered the following business day.   You may also send us  a message via MyChart. We typically respond to MyChart messages within 1-2 business days.  For prescription refills, please ask your pharmacy to contact our office. Our fax number is [phone].  If you have an urgent issue when the clinic is closed that cannot wait until the next business day, you can page your doctor at the number below.    Please note that while we do our best to be available for urgent issues outside of office hours, we  are not available 24/7.   If you have an urgent issue and are unable to reach us , you may choose to seek medical care at your doctor's office, retail clinic, urgent care center, or emergency room.  If you have a medical emergency, please immediately call 911 or go to the emergency department.  Pager Numbers  - Dr. Hester: [phone]  - Dr. Jackquline: [phone]  - Dr. Claudene: [phone]   - Dr. Raymund: [phone]  In the event of inclement weather,  please call our main line at [phone] for an update on the status of any delays or closures.  Dermatology Medication Tips: Please keep the boxes that topical medications come in in order to help keep track of the instructions about where and how to use these. Pharmacies typically print the medication instructions only on the boxes and not directly on the medication tubes.   If your medication is too expensive, please contact our office at [phone] option 4 or send us  a message through MyChart.   We are unable to tell what your co-pay for medications will be in advance as this is different depending on your insurance coverage. However, we may be able to find a substitute medication at lower cost or fill out paperwork to get insurance to cover a needed medication.   If a prior authorization is required to get your medication covered by your insurance company, please allow us  1-2 business days to complete this process.  Drug prices often vary depending on where the prescription is filled and some pharmacies may offer cheaper prices.  The website www.goodrx.com contains coupons for medications through different pharmacies. The prices here do not account for what the cost may be with help from insurance (it may be cheaper with your insurance), but the website can give you the price if you did not use any insurance.  - You can print the associated coupon and take it with your prescription to the pharmacy.  - You may also stop by our office during regular business hours and pick up a GoodRx coupon card.  - If you need your prescription sent electronically to a different pharmacy, notify our office through [facility] or by phone at [phone] option 4.     Si Usted Necesita Algo Despus de Su Visita  Tambin puede enviarnos un mensaje a travs de Clinical Cytogeneticist. Por lo general respondemos a los mensajes de MyChart en el transcurso de 1 a 2 das hbiles.  Para renovar recetas, por favor  pida a su farmacia que se ponga en contacto con nuestra oficina. Randi lakes de fax es [place] [phone].  Si tiene un asunto urgente cuando la clnica est cerrada y que no puede esperar hasta el siguiente da hbil, puede llamar/localizar a su doctor(a) al nmero que aparece a continuacin.   Por favor, tenga en cuenta que aunque hacemos todo lo posible para estar disponibles para asuntos urgentes fuera del horario de [place], no estamos disponibles las 24 horas del da, los 7 [address]  [address] de la [place].   Si tiene un problema urgente y no puede comunicarse con nosotros, puede optar por buscar atencin mdica  en el consultorio de su doctor(a), en una clnica privada, en un centro de atencin urgente o en una sala de emergencias.  Si tiene engineer, drilling, por favor llame inmediatamente al 911 o vaya a la sala de emergencias.  Nmeros de bper  - Dr. Hester: [phone]  - Dra. Jackquline: [phone]  - Dr. Claudene: [phone]  -  Dra. Raymund: [phone]  En caso de inclemencias del tiempo, por favor llame a nuestra lnea principal al [phone] para una actualizacin sobre el [place] de cualquier retraso o cierre.  Consejos para la medicacin en dermatologa: Por favor, guarde las cajas en las que vienen los medicamentos de uso tpico para ayudarle a seguir las instrucciones sobre dnde y cmo usarlos. Las farmacias generalmente imprimen las instrucciones del medicamento slo en las cajas y no directamente en los tubos del [place].   Si su medicamento es muy caro, por favor, pngase en contacto con landry rieger llamando al [phone] y presione la opcin 4 o envenos un mensaje a travs de Clinical Cytogeneticist.   No podemos decirle cul ser su copago por los medicamentos por adelantado ya que esto es diferente dependiendo de la cobertura de su seguro. Sin embargo, es posible que podamos encontrar un medicamento sustituto a audiological scientist un formulario para que el seguro cubra el  medicamento que se considera necesario.   Si se requiere una autorizacin previa para que su compaa de seguros cubra su medicamento, por favor permtanos de 1 a 2 das hbiles para completar este proceso.  Los precios de los medicamentos varan con frecuencia dependiendo del environmental consultant de dnde se surte la receta y alguna farmacias pueden ofrecer precios ms baratos.  El sitio web www.goodrx.com tiene cupones para medicamentos de health and safety inspector. Los precios aqu no tienen en cuenta lo que podra costar con la ayuda del seguro (puede ser ms barato con su seguro), pero el sitio web puede darle el precio si no utiliz tourist information centre manager.  - Puede imprimir el cupn correspondiente y llevarlo con su receta a la farmacia.  - Tambin puede pasar por nuestra oficina durante el horario de atencin regular y education officer, museum una tarjeta de cupones de GoodRx.  - Si necesita que su receta se enve electrnicamente a una farmacia diferente, informe a nuestra oficina a travs de MyChart de [place] o por telfono llamando al [phone] y presione la opcin 4.

## 2024-02-22 NOTE — Progress Notes (Signed)
 Follow-Up Visit   Subjective  Jeffrey Buchanan is a 68 y.o. male who presents for the following: Skin Cancer Screening and Upper Body Skin Exam. Patient with Lynch Syndrome (Muir-Torre).   The patient presents for Upper Body Skin Exam (UBSE) for skin cancer screening and mole check. The patient has spots, moles and lesions to be evaluated, some may be new or changing. Recheck left infra orbital, biopsy proven sebaceous neoplasm with atypical features (11/26/23). Patient had consult with Dr. Paci and she recommended Mohs surgery. Patient cancelled Mohs appointment due to the unknown cost of procedure. Patient with a few areas to check on the legs.    The following portions of the chart were reviewed this encounter and updated as appropriate: medications, allergies, medical history  Review of Systems:  No other skin or systemic complaints except as noted in HPI or Assessment and Plan.  Objective  Well appearing patient in no apparent distress; mood and affect are within normal limits.  All skin waist up and legs examined. Relevant physical exam findings are noted in the Assessment and Plan.  Left Medial Upper Thigh x 1 Erythematous stuck-on, waxy papule Left Infra orbital Slight pink yellow induation at biopsy site 2.5 mm   Assessment & Plan   INFLAMED SEBORRHEIC KERATOSIS Left Medial Upper Thigh x 1 Symptomatic, irritating, patient would like treated. Destruction of lesion - Left Medial Upper Thigh x 1  Destruction method: cryotherapy   Informed consent: discussed and consent obtained   Lesion destroyed using liquid nitrogen: Yes   Region frozen until ice ball extended beyond lesion: Yes   Outcome: patient tolerated procedure well with no complications   Post-procedure details: wound care instructions given   Additional details:  Prior to procedure, discussed risks of blister formation, small wound, skin dyspigmentation, or rare scar following cryotherapy. Recommend Vaseline  ointment to treated areas while healing.   NEOPLASM OF UNCERTAIN BEHAVIOR OF SKIN Left Infra orbital Skin / nail biopsy Type of biopsy: punch   Informed consent: discussed and consent obtained   Patient was prepped and draped in usual sterile fashion: Area prepped with alcohol . Anesthesia: the lesion was anesthetized in a standard fashion   Anesthetic:  1% lidocaine  w/ epinephrine 1-100,000 buffered w/ 8.4% NaHCO3 Punch size:  3.5 mm Suture removal (days):  7 Hemostasis achieved with: pressure, aluminum chloride and electrodesiccation   Outcome: patient tolerated procedure well   Post-procedure details: wound care instructions given   Post-procedure details comment:  Ointment and pressure dressing applied  Specimen 1 - Surgical pathology Differential Diagnosis: SEBACEOUS NEOPLASM WITH ATYPICAL FEATURES, with possible recurnce Check Margins: Yes 3.5 mm punch [id number] Pt with Lynch Syndrome Biopsy proven SEBACEOUS NEOPLASM WITH ATYPICAL FEATURES at the left infraorbital with possible recurrence.  Pending biopsy, may recommend Mohs surgery SKIN CANCER SCREENING   ACTINIC SKIN DAMAGE   LENTIGO   SEBORRHEIC KERATOSIS   HEMANGIOMA OF SKIN   NEVUS   MUIR-TORRE SYNDROME   CHONDRODERMATITIS NODULARIS HELICIS OF LEFT EAR   NUMMULAR DERMATITIS    Skin cancer screening performed today.  Actinic Damage - Chronic condition, secondary to cumulative UV/sun exposure - diffuse scaly erythematous macules with underlying dyspigmentation - Recommend daily broad spectrum sunscreen SPF 30+ to sun-exposed areas, reapply every 2 hours as needed.  - Staying in the shade or wearing long sleeves, sun glasses (UVA+UVB protection) and wide brim hats (4-inch brim around the entire circumference of the hat) are also recommended for sun protection.  - Call for new  or changing lesions.  Lentigines, Seborrheic Keratoses, Hemangiomas (including right thigh purple papule) - Benign normal  skin lesions - Benign-appearing - Call for any changes  Melanocytic Nevi - Tan-brown and/or pink-flesh-colored symmetric macules and papules - Pink flesh papule at right upper back 8 x 5 mm, Benign features under dermoscopy.  - 6 mm pink papule at left lat upper back (vs inflammatory papule) - Benign appearing on exam today - Observation - Call clinic for new or changing moles - Recommend daily use of broad spectrum spf 30+ sunscreen to sun-exposed areas.   H/O Sebaceous Adenoma - R temple, biopsy proven and treated with EDC. Patient tested positive for Lynch Syndrome (Muir-Torre) Exam: Clear today at Right temple   Muir-Torre syndrome is a chronic genetic condition, with sebaceous neoplasms and increase cancer risk   Observe for recurrence. Call clinic for new or changing lesions.  Recommend regular skin exams, daily broad-spectrum spf 30+ sunscreen use, and photoprotection.  Pt needs close f/up with PCP for cancer screenings.   CHONDRODERMATITIS NODULARIS HELICIS Exam: Mild erythema at left antihelix, pt states doing better since he started sleeping on right side more often   Chondrodermatitis Nodularis Chronica Helicis (CNCH or CNH) is a common, benign inflammatory condition of the ear cartilage and overlying skin associated with very sensitive tender papule(s).  Trauma or pressure from sleeping on the ear or from cell phone use and sun damage may be exacerbating factors.  Treatment may include using a C-shaped airplane neck pillow for sleeping on the side of the head so no pressure is on the ear.  Other treatments include topical or intralesional steroids; liquid nitrogen or laser destruction; shave removal or excision.  The condition can be difficult to treat and persist or recur despite treatment.  Treatment Plan: Recommend using donut pillow to minimize pressure on the left ear while sleeping. Recommend OTC 1% hydrocortisone cream 1-2 times daily to affected area as  needed.  NUMMULAR DERMATITIS WITH SECONDARY IMPETIGINIZATION  Exam: Left medial calf, right anterior ankle, left medial ankle and left lower knee with pink scaly crusted patch.  Chronic and persistent condition with duration or expected duration over one year. Condition is bothersome/symptomatic for patient. Currently flared.  Nummular dermatitis (eczema) is a chronic, relapsing, pruritic condition that can significantly affect quality of life. It is often associated with dry skin can require treatment with prescription topical medications, in addition to dry skin care.  If there is underlying atopic dermatitis and topicals are not working, then biologic injections may be necessary to control symptoms.  Treatment Plan: Start TMC 0.1% Cream twice daily as needed for itch. Avoid applying to face, groin, and axilla. Use as directed. Long-term use can cause thinning of the skin.  Start Mupirocin 2% ointment twice daily to open/crusted areas until healed.  Avoid scratching, picking, which can spread infection  Return in about 6 months (around 08/21/2024) for spot check, Pt with Lynch Syndrome.SABRA LILLETTE Andrea Ezzard, CMA, am acting as scribe for Rexene Rattler, MD .   Documentation: I have reviewed the above documentation for accuracy and completeness, and I agree with the above.  Rexene Rattler, MD

## 2024-02-23 ENCOUNTER — Ambulatory Visit: Payer: Self-pay | Admitting: Dermatology

## 2024-02-23 LAB — SURGICAL PATHOLOGY

## 2024-02-24 NOTE — Telephone Encounter (Signed)
 Advised pt of bx results/sh ?

## 2024-02-24 NOTE — Telephone Encounter (Signed)
-----   Message from Rexene Rattler sent at 02/23/2024  5:52 PM EST -----  1. Skin, left infra orbital :       EXCISION, SCAR AND CHANGES CONSISTENT WITH PREVIOUS PROCEDURE, NO RESIDUAL       SEBACEOUS NEOPLASM SEEN   No residual atypical sebaceous lesion, will monitor area for recurrence- please call patient ----- Message ----- From: Interface, Lab In Three Zero One Sent: 02/23/2024   5:36 PM EST To: Rexene Rattler, MD

## 2024-02-25 DIAGNOSIS — H35341 Macular cyst, hole, or pseudohole, right eye: Secondary | ICD-10-CM | POA: Diagnosis not present

## 2024-02-25 DIAGNOSIS — H43813 Vitreous degeneration, bilateral: Secondary | ICD-10-CM | POA: Diagnosis not present

## 2024-02-25 DIAGNOSIS — H35033 Hypertensive retinopathy, bilateral: Secondary | ICD-10-CM | POA: Diagnosis not present

## 2024-02-25 DIAGNOSIS — H2513 Age-related nuclear cataract, bilateral: Secondary | ICD-10-CM | POA: Diagnosis not present

## 2024-02-25 DIAGNOSIS — H43821 Vitreomacular adhesion, right eye: Secondary | ICD-10-CM | POA: Diagnosis not present

## 2024-02-25 DIAGNOSIS — H43822 Vitreomacular adhesion, left eye: Secondary | ICD-10-CM | POA: Diagnosis not present

## 2024-03-01 ENCOUNTER — Ambulatory Visit: Admitting: Urology

## 2024-03-01 VITALS — BP 137/78 | HR 66 | Wt 210.0 lb

## 2024-03-01 DIAGNOSIS — N401 Enlarged prostate with lower urinary tract symptoms: Secondary | ICD-10-CM | POA: Diagnosis not present

## 2024-03-01 DIAGNOSIS — N138 Other obstructive and reflux uropathy: Secondary | ICD-10-CM | POA: Diagnosis not present

## 2024-03-01 DIAGNOSIS — Z01818 Encounter for other preprocedural examination: Secondary | ICD-10-CM | POA: Diagnosis not present

## 2024-03-01 LAB — URINALYSIS, COMPLETE
Bilirubin, UA: NEGATIVE
Ketones, UA: NEGATIVE
Leukocytes,UA: NEGATIVE
Nitrite, UA: NEGATIVE
Specific Gravity, UA: 1.03 (ref 1.005–1.030)
Urobilinogen, Ur: 0.2 mg/dL (ref 0.2–1.0)
pH, UA: 6 (ref 5.0–7.5)

## 2024-03-01 LAB — MICROSCOPIC EXAMINATION: RBC, Urine: >30 /HPF — AB (ref 0–2)

## 2024-03-01 NOTE — Patient Instructions (Signed)

## 2024-03-01 NOTE — Progress Notes (Signed)
   03/01/2024 2:14 PM   Jeffrey Buchanan 06-27-1955 [id number]  Reason for visit: BPH and discuss HOLEP  History: Long history of obstructive urinary symptoms despite maximal medical management, prostate measured 105g on recent CT, recent cystoscopy with moderate to severe bladder trabeculation and obstructive appearing prostate Referred to discuss HOLEP  Physical Exam: BP 137/78 (BP Location: Left Arm, Patient Position: Sitting, Cuff Size: Normal)   Pulse 66   Wt 210 lb (95.3 kg)   SpO2 96%   BMI 26.96 kg/m    Imaging/labs: I personally viewed and interpreted the CT from February 2025, prostate measures 103g on my review.  Left ureteral stone since has been treated PSA April 2025 2.8, corrected for finasteride  5.6, low PSA density  Today: Primary bothersome symptoms are dribbling, frequency, urgency, nocturia He is interested in pursuing HOLEP  Plan:   BPH with obstruction: We discussed the risks and benefits of HoLEP at length.  The procedure requires general anesthesia and takes 1 to 2 hours, and a holmium laser is used to enucleate the prostate and push this tissue into the bladder.  A morcellator is then used to remove this tissue, which is sent for pathology.  The vast majority(>95%) of patients are able to discharge the same day with a catheter in place for 2 to 3 days, and will follow-up in clinic for a voiding trial.  We specifically discussed the risks of bleeding, infection, retrograde ejaculation, temporary urgency and urge incontinence, very low risk of long-term incontinence, urethral stricture/bladder neck contracture, pathologic evaluation of prostate tissue and possible detection of prostate cancer or other malignancy, and possible need for additional procedures. Schedule HOLEP(103g)   Jeffrey JAYSON Burnet, MD  [facility] [address], [address] [place], [place] [address] [phone]

## 2024-03-04 LAB — CULTURE, URINE COMPREHENSIVE

## 2024-03-07 DIAGNOSIS — M5136 Other intervertebral disc degeneration, lumbar region with discogenic back pain only: Secondary | ICD-10-CM | POA: Diagnosis not present

## 2024-03-07 DIAGNOSIS — M9902 Segmental and somatic dysfunction of thoracic region: Secondary | ICD-10-CM | POA: Diagnosis not present

## 2024-03-07 DIAGNOSIS — M9903 Segmental and somatic dysfunction of lumbar region: Secondary | ICD-10-CM | POA: Diagnosis not present

## 2024-03-07 DIAGNOSIS — M62830 Muscle spasm of back: Secondary | ICD-10-CM | POA: Diagnosis not present

## 2024-03-09 DIAGNOSIS — H35341 Macular cyst, hole, or pseudohole, right eye: Secondary | ICD-10-CM | POA: Diagnosis not present

## 2024-03-09 DIAGNOSIS — H35371 Puckering of macula, right eye: Secondary | ICD-10-CM | POA: Diagnosis not present

## 2024-03-10 ENCOUNTER — Telehealth: Payer: Self-pay

## 2024-03-10 NOTE — Telephone Encounter (Signed)
 Received surgical Clearance from Cardiology. Patient is aware to hold Plavix and ASA for 5 days prior to surgery. Last dosage on 03/12/2024. Pt. Verbalized understanding.

## 2024-03-11 ENCOUNTER — Inpatient Hospital Stay
Admission: RE | Admit: 2024-03-11 | Discharge: 2024-03-11 | Disposition: A | Source: Ambulatory Visit | Attending: Urology

## 2024-03-11 ENCOUNTER — Other Ambulatory Visit: Payer: Self-pay

## 2024-03-11 VITALS — Ht 74.0 in | Wt 208.0 lb

## 2024-03-11 DIAGNOSIS — I2510 Atherosclerotic heart disease of native coronary artery without angina pectoris: Secondary | ICD-10-CM

## 2024-03-11 DIAGNOSIS — I447 Left bundle-branch block, unspecified: Secondary | ICD-10-CM

## 2024-03-11 DIAGNOSIS — Z01812 Encounter for preprocedural laboratory examination: Secondary | ICD-10-CM

## 2024-03-11 HISTORY — DX: Left bundle-branch block, unspecified: I44.7

## 2024-03-11 NOTE — Patient Instructions (Addendum)
 Your procedure is scheduled on:   FRIDAY DECEMBER 12  Report to the Registration Desk on the 1st floor of the [facility] To find out your arrival time, please call [phone] between 1PM - 3PM on:  THURSDAY  DECEMBER  11 If your arrival time is 6:00 am, do not arrive before that time as the [facility] entrance doors do not open until 6:00 am.  REMEMBER: Instructions that are not followed completely may result in serious medical risk, up to and including death; or upon the discretion of your surgeon and anesthesiologist your surgery may need to be rescheduled.  Do not eat food after midnight the night before surgery.  No gum chewing or hard candies.  One week prior to surgery: Stop Anti-inflammatories (NSAIDS) such as Advil, Aleve, Ibuprofen, Motrin, Naproxen, Naprosyn and Aspirin  based products such as Excedrin, Goody's Powder, BC Powder. Stop ANY OVER THE COUNTER supplements until after surgery. Cholecalciferol (VITAMIN D3)   You may however, continue to take Tylenol  if needed for pain up until the day of surgery.  **Follow guidelines for insulin and diabetes medications.** empagliflozin (JARDIANCE) hold 3 days prior to surgery, last dose MONDAY DECEMBER 8  **Follow recommendations regarding stopping blood thinners.** clopidogrel (PLAVIX) hold 5 days prior to surgery, last dose SATURDAY DECEMBER 6  Continue taking all of your other prescription medications up until the day of surgery.  ON THE DAY OF SURGERY ONLY TAKE THESE MEDICATIONS WITH SIPS OF WATER :  finasteride  (PROSCAR )  HYDROcodone -acetaminophen  (NORCO/VICODIN) if needed for pain  No Alcohol  for 24 hours before or after surgery.  No Smoking including e-cigarettes for 24 hours before surgery.   Do not use any recreational drugs for at least a week (preferably 2 weeks) before your surgery.  Please be advised that the combination of cocaine and anesthesia may have negative outcomes, up to and including death. If  you test positive for cocaine, your surgery will be cancelled.  On the morning of surgery brush your teeth with toothpaste and water , you may rinse your mouth with mouthwash if you wish. Do not swallow any toothpaste or mouthwash.  Do not wear jewelry, make-up, hairpins, clips or nail polish.  For welded (permanent) jewelry: bracelets, anklets, waist bands, etc.  Please have this removed prior to surgery.  If it is not removed, there is a chance that hospital personnel will need to cut it off on the day of surgery.  Do not wear lotions, powders, or perfumes.   Do not shave body hair from the neck down 48 hours before surgery.  Do not bring valuables to the hospital. [facility] is not responsible for any missing/lost belongings or valuables.   Notify your doctor if there is any change in your medical condition (cold, fever, infection).  Wear comfortable clothing (specific to your surgery type) to the hospital.  After surgery, you can help prevent lung complications by doing breathing exercises.  Take deep breaths and cough every 1-2 hours.   If you are being discharged the day of surgery, you will not be allowed to drive home. You will need a responsible individual to drive you home and stay with you for 24 hours after surgery.   If you are taking public transportation, you will need to have a responsible individual with you.  Please call the Pre-admissions Testing Dept. at [phone] if you have any questions about these instructions.  Surgery Visitation Policy:  Patients having surgery or a procedure may have two visitors.  Children  under the age of 79 must have an adult with them who is not the patient.  [employer]ch[employer] to address health-related social needs:  [email or web address]

## 2024-03-14 ENCOUNTER — Encounter: Admitting: Physician Assistant

## 2024-03-14 ENCOUNTER — Encounter: Payer: Self-pay | Admitting: Urology

## 2024-03-14 NOTE — Progress Notes (Signed)
 Perioperative / Anesthesia Services  Pre-Admission Testing Clinical Review / Pre-Operative Anesthesia Consult  Date: 03/14/24  PATIENT DEMOGRAPHICS: Name: Jeffrey Buchanan DOB: 1956/03/10 MRN:   [id number]  Note: Available PAT nursing documentation and vital signs have been reviewed. Clinical nursing staff has updated patient's PMH/PSHx, current medication list, and drug allergies/intolerances to ensure complete and comprehensive history available to assist care teams in MDM as it pertains to the aforementioned surgical procedure and anticipated anesthetic course. Extensive review of available clinical information personally performed. Nursing documentation reviewed. [place] [facility] and PSHx updated with any diagnoses and/or procedures that I have knowledge of that may have been inadvertently omitted during his intake with the pre-admission testing department's nursing staff.  PLANNED SURGICAL PROCEDURE(S):   Case: [id number] Date/Time: 03/18/24 1116   Procedure: ENUCLEATION, PROSTATE, USING LASER, WITH MORCELLATION   Anesthesia type: General   Diagnosis: Benign prostatic hyperplasia with urinary obstruction [N40.1, N13.8]   Pre-op diagnosis: Benign Prostatic Hyperplasia with Urinary Obstruction   Location: [facility] OR ROOM 10 / [facility]   Surgeons: Francisca Redell BROCKS, MD        CLINICAL DISCUSSION: GERRELL Buchanan is a 68 y.o. male who is submitted for pre-surgical anesthesia review and clearance prior to him undergoing the above procedure.  Patient is a Current Smoker (32 pack years). Pertinent PMH includes: CAD, MI, HFrEF, infrarenal AAA, LBBB, PVCs, aortic atherosclerosis, HTN, HLD, GERD (no daily Tx), MLH1-related Lynch syndrome, OA, DJD, chronic back pain, urothelial carcinoma, BPH, nephrolithiasis, OA, chronic back pain, anxiety (on BZO).   Patient is followed by cardiology Jodeen, MD). He was last seen in the cardiology clinic on ***; notes reviewed. ***At the time  of his clinic visit, patient doing well overall from a cardiovascular perspective. Patient denied any chest pain, shortness of breath, PND, orthopnea, palpitations, significant peripheral edema, weakness, fatigue, vertiginous symptoms, or presyncope/syncope. Patient with a past medical history significant for cardiovascular diagnoses. Documented physical exam was grossly benign, providing no evidence of acute exacerbation and/or decompensation of the patient's known cardiovascular conditions.  ***  Blood pressure***controlled at *** mmHg on currently prescribed *** therapies.  Patient is on *** for his HLD diagnosis and ASCVD prevention. ***Patient is not diabetic. ***He does not have an OSAH diagnosis. ***FC. No changes were made to his medication regimen during his visit with cardiology.  Patient scheduled to follow-up with outpatient cardiology in***months or sooner if needed.  [name] is scheduled for an elective ENUCLEATION, PROSTATE, USING LASER, WITH MORCELLATION on 03/18/2024 with Dr. Redell BROCKS Francisca, MD. Given patient's past medical history significant for cardiovascular diagnoses, presurgical cardiac clearance was sought by the PAT team. Per cardiology, this patient is optimized for surgery and may proceed with the planned procedural course with a LOW risk of significant perioperative cardiovascular complications.  Again, this patient is on daily DAPT therapy.  He has been instructed on recommendations for holding pedicle for 5 days prior to procedure with plans to restart as soon as postoperative bleeding was felt to be minimized by his primary attending surgeon.  Patient is aware that his last dose of clopidogrel should be on 03/12/2024. Given that patient's past medical history is significant for cardiovascular diagnoses, including but not limited to CAD, urology has cleared patient to continue his daily low dose ASA throughout his perioperative course. He will be asked to hold his  normal dose on the day of his procedure only. Patient has been updated on these directives from his specialty  care providers by the PAT team.  Patient denies previous perioperative complications with anesthesia in the past. In review his EMR, it is noted that patient underwent a general anesthetic course here at [facility] ([facility]) in 05/2023 without documented complications.   MOST RECENT VITAL SIGNS:    03/11/2024   11:42 AM 03/01/2024    2:17 PM 02/02/2024   10:07 AM  Vitals with BMI  Height 6' 2  6' 2  Weight 208 lbs 210 lbs 208 lbs  BMI 26.69 26.95 26.69  Systolic  137 132  Diastolic  78 73  Pulse  66 96   PROVIDERS/SPECIALISTS: NOTE: Primary physician provider listed below. Patient may have been seen by APP or partner within same practice.   PROVIDER ROLE / SPECIALTY LAST [place] Francisca Redell JAYSON, MD Urology (Surgeon) 03/01/2024  Fernande Ophelia JINNY DOUGLAS, MD Primary Care Provider 11/16/2023  Wilburn Fillers, MD Cardiology 01/01/2024   ALLERGIES: Allergies  Allergen Reactions   Cephalosporins Diarrhea   Escitalopram Oxalate Other (See Comments)    lethargic    CURRENT HOME MEDICATIONS: No current facility-administered medications for this encounter.    ALPRAZolam  (XANAX ) 0.5 MG tablet   amiodarone  (PACERONE ) 200 MG tablet   aspirin  EC 81 MG tablet   Cholecalciferol (VITAMIN D3) 2000 UNITS TABS   clopidogrel (PLAVIX) 75 MG tablet   docusate sodium  (COLACE) 100 MG capsule   empagliflozin (JARDIANCE) 10 MG TABS tablet   finasteride  (PROSCAR ) 5 MG tablet   HYDROcodone -acetaminophen  (NORCO/VICODIN) 5-325 MG tablet   metoprolol  succinate (TOPROL -XL) 50 MG 24 hr tablet   mupirocin  ointment (BACTROBAN ) 2 %   Potassium Citrate  15 MEQ (1620 MG) TBCR   rosuvastatin  (CRESTOR ) 40 MG tablet   sacubitril-valsartan (ENTRESTO) 49-51 MG   tamsulosin  (FLOMAX ) 0.4 MG CAPS capsule   TRIAMCINOLONE  ACETONIDE IN   triamcinolone  cream (KENALOG ) 0.1 %    ofloxacin (FLOXIN) 0.3 % OTIC solution   prednisoLONE acetate (PRED FORTE) 1 % ophthalmic suspension   HISTORY: Past Medical History:  Diagnosis Date   Acute MI, inferior wall (HCC) 04/06/2003   a.) PCI 04/10/2003 --> 90% pRCA --> 3.5 x 18 mm and 4.0 x 23 mm Vision stents to RCA.   Acute pyelonephritis 12/19/2022   AKI (acute kidney injury) 12/19/2022   Anxiety    a.) on BZO (alprazolam ) PRN   Aortic atherosclerosis    Arthritis    Bladder tumor    BPH (benign prostatic hyperplasia)    Chronic back pain    Coronary artery disease 04/10/2003   a.) LHC 04/10/2003 --> EF 40%; 90% pRCA, 40% x 2 mRCA, 30% and 20% pLCx; 3.5 x 18 mm and 4.0 x 23 mm Vision stents to RCA.   DJD (degenerative joint disease)    Epididymal cyst (BILATERAL)    GERD (gastroesophageal reflux disease)    Headache    HFrEF (heart failure with reduced ejection fraction) (HCC)    History of adenomatous polyp of colon    S/P  PARTIAL COLECTOMY 2006   History of kidney stones    HLD (hyperlipidemia)    Hyperlipidemia    Hypertension    Infrarenal abdominal aortic aneurysm (AAA) without rupture    LBBB (left bundle branch block)    Left thyroid  nodule    Long term current use of amiodarone     Long term current use of clopidogrel    Long-term use of aspirin  therapy    MLH1-related Lynch syndrome (HNPCC2) 12/18/2022   PVC's (  premature ventricular contractions)    Skin cancer    Urothelial carcinoma of bladder (HCC) 09/14/2012   a.) Bx 09/14/2012 --> pathology (+) for low grade papillary urothelial carcinoma.   Vitamin D deficiency    Past Surgical History:  Procedure Laterality Date   COLONOSCOPY WITH PROPOFOL  N/A 03/04/2017   Procedure: COLONOSCOPY WITH PROPOFOL ;  Surgeon: Toledo, Ladell POUR, MD;  Location: [facility];  Service: Gastroenterology;  Laterality: N/A;   CORONARY ANGIOPLASTY WITH STENT PLACEMENT Left 04/10/2003   Procedure: CORONARY ANGIOPLASTY WITH STENT PLACEMENT (3.5 x 18 mm and 4.0 x 23 mm  Vision stents to RCA); Location: [facility]; Surgeon: Wolm Rhyme, MD   CYSTOSCOPY W/ RETROGRADES Bilateral 09/26/2013   Procedure: CYSTOSCOPY WITH BILATERAL RETROGRADE PYELOGRAM;  Surgeon: Toribio CINDERELLA Repine, MD;  Location: [facility];  Service: Urology;  Laterality: Bilateral;   CYSTOSCOPY WITH LITHOLAPAXY N/A 06/13/2019   Procedure: CYSTOSCOPY;  Surgeon: Penne Knee, MD;  Location: [facility];  Service: Urology;  Laterality: N/A;   CYSTOSCOPY WITH LITHOLAPAXY N/A 01/28/2021   Procedure: CYSTOSCOPY WITH LITHOLAPAXY;  Surgeon: Penne Knee, MD;  Location: [facility];  Service: Urology;  Laterality: N/A;   CYSTOSCOPY WITH LITHOLAPAXY N/A 12/20/2022   Procedure: CYSTOSCOPY WITH LITHOLAPAXY for Bladder [place];  Surgeon: Twylla Glendia BROCKS, MD;  Location: [facility];  Service: Urology;  Laterality: N/A;   CYSTOSCOPY/URETEROSCOPY/HOLMIUM LASER/STENT PLACEMENT Left 12/20/2022   Procedure: CYSTOSCOPY/URETEROSCOPY/HOLMIUM LASER/STENT PLACEMENT;  Surgeon: Twylla Glendia BROCKS, MD;  Location: [facility];  Service: Urology;  Laterality: Left;   CYSTOSCOPY/URETEROSCOPY/HOLMIUM LASER/STENT PLACEMENT Left 05/26/2023   Procedure: CYSTOSCOPY/URETEROSCOPY/HOLMIUM LASER/STENT PLACEMENT;  Surgeon: Twylla Glendia BROCKS, MD;  Location: [facility];  Service: Urology;  Laterality: Left;   ESOPHAGOGASTRODUODENOSCOPY (EGD) WITH PROPOFOL  N/A 03/04/2017   Procedure: ESOPHAGOGASTRODUODENOSCOPY (EGD) WITH PROPOFOL ;  Surgeon: Toledo, Ladell POUR, MD;  Location: [facility];  Service: Gastroenterology;  Laterality: N/A;   OPEN REDUCTION SHOULDER DISLOCATION  04/07/1974   PARTIAL COLECTOMY  04/07/2004   STONE EXTRACTION WITH BASKET N/A 06/13/2019   Procedure: STONE EXTRACTION WITH BASKET;  Surgeon: Penne Knee, MD;  Location: [facility];  Service: Urology;  Laterality: N/A;   TRANSURETHRAL RESECTION OF BLADDER TUMOR N/A 2014   TRANSURETHRAL RESECTION OF BLADDER TUMOR WITH GYRUS (TURBT-GYRUS) N/A 09/26/2013   Procedure: BLADDER  BIOPSY;  Surgeon: Toribio CINDERELLA Repine, MD;  Location: [facility];  Service: Urology;  Laterality: N/A;   Family History  Problem Relation Age of Onset   Bladder Cancer Mother        dx 31s   Prostate cancer Brother        dx 69s   Cancer Maternal Grandmother        unk type   Kidney disease Neg Hx    Social History   Tobacco Use   Smoking status: Every Day    Current packs/day: 1.00    Average packs/day: 1 pack/day for 32.0 years (32.0 ttl pk-yrs)    Types: Cigarettes   Smokeless tobacco: Never  Substance Use Topics   Alcohol  use: No   LABS:  Lab Results  Component Value Date   WBC 6.3 05/21/2023   HGB 15.9 05/21/2023   HCT 48.1 05/21/2023   MCV 92.9 05/21/2023   PLT 148 (L) 05/21/2023   Lab Results  Component Value Date   NA 139 05/21/2023   CL 102 05/21/2023   K 3.7 05/21/2023   CO2 28 05/21/2023   BUN 21 05/21/2023   CREATININE 1.17 05/21/2023   GFRNONAA >60 05/21/2023  CALCIUM  9.3 05/21/2023   PHOS 3.4 01/14/2023   ALBUMIN 4.0 05/21/2023   GLUCOSE 153 (H) 05/21/2023    ECG: Date: 11/06/2023  Time ECG obtained: 0922 AM Rate: 52 bpm Rhythm: Sinus bradycardia; LBBB Axis (leads I and aVF): left Intervals: PR 170 ms. QRS 190 ms. QTc 491 ms. ST segment and T wave changes: No evidence of acute T wave abnormalities or significant ST segment elevation or depression.  Evidence of a possible, age undetermined, prior infarct:  No Comparison: Similar to previous tracing obtained on 04/23/2023 NOTE: Tracing obtained at [facility]; unable for review. Above based on cardiologist's interpretation.    IMAGING / PROCEDURES: US  SCROTUM W/DOPPLER performed on 06/11/2023 Bilateral tubular ectasia of the rete testes. Bilateral epididymal cysts.   CT RENAL STONE STUDY performed on 05/21/2023 Bilateral nephrolithiasis with 8 x 6 mm stone at the left ureteropelvic junction contributing to mild left hydronephrosis. Stable 3.0 cm infrarenal abdominal  aortic aneurysm. Recommend follow-up every 3 years. Prostatomegaly. Aortic atherosclerosis  LONG TERM CARDIAC EVENT MONITOR STUDY performed on 01/16/2023 HR 36 - 105, average 62.  Rare supraventricular and ventricular ectopy. No atrial fibrillation. No sustained arrhythmias.  CT CHEST LUNG CA SCREEN LOW DOSE W/O CM performed on 11/25/2022 Lung-RADS 2, benign appearance or behavior. Continue annual screening with low-dose chest CT without contrast in 12 months. Left renal stones. 2.2 cm low-attenuation left thyroid  nodule. Recommend thyroid  ultrasound. (Ref: J Am Coll Radiol. 2015 Feb;12(2): 143-50). Aortic atherosclerosis  Coronary artery calcification. Enlarged pulmonic trunk, indicative of pulmonary arterial hypertension. Emphysema  MR CARDIAC MORPHOLOGY W WO CONTRAST performed on 09/03/2022 Moderate to severely reduced LV systolic function (LVEF = 33%). Subendocardial scar in the basal-mid inferoseptal and inferior LV walls. Scar/LGE comprises 16.5g (about 9%) of total myocardial mass. Global hypokinesis with severe hypokinesis of the basal-mid inferoseptal and inferior wall. Normal RV size and function. Findings suggest prior basal-mid inferoseptal and inferior LV infarct (RCA territory).    IMPRESSION AND PLAN: Jeffrey Buchanan has been referred for pre-anesthesia review and clearance prior to him undergoing the planned anesthetic and procedural courses. Available labs, pertinent testing, and imaging results were personally reviewed by me in preparation for upcoming operative/procedural course. [facility] medical record has been updated following extensive record review and patient interview with PAT staff.   ***Preopfix ***PPM  ATTENTION --> PENDING CLEARANCE AT THIS TIME -- NOTE/CONTENTS NOT FINAL UNTIL SIGNED This patient has been appropriately cleared by cardiology with an overall *** risk of patient experiencing significant perioperative cardiovascular complications.  here at [facility]. Based on clinical review performed today (03/14/24), barring any significant acute changes in the patient's overall condition, it is anticipated that he will be able to proceed with the planned surgical intervention. Any acute changes in clinical condition may necessitate his procedure being postponed and/or cancelled. Patient will meet with anesthesia team (MD and/or CRNA) on the day of his procedure for preoperative evaluation/assessment. Questions regarding anesthetic course will be fielded at that time.   Pre-surgical instructions were reviewed with the patient during his PAT appointment, and questions were fielded to satisfaction by PAT clinical staff. He has been instructed on which medications that he will need to hold prior to surgery, as well as the ones that have been deemed safe/appropriate to take on the day of his procedure. As part of the general education provided by PAT, patient made aware both verbally and in writing, that he would need to abstain from the use of  any illegal substances during his perioperative course. He was advised that failure to follow the provided instructions could necessitate case cancellation or result in serious perioperative complications up to and including death. Patient encouraged to contact PAT and/or his surgeon's office to discuss any questions or concerns that may arise prior to surgery; verbalized understanding.   Dorise Pereyra, MSN, APRN, FNP-C, CEN [facility]  Perioperative Services Nurse Practitioner Phone: [phone] Fax: [phone] 03/14/24 12:49 PM  NOTE: This note has been prepared using Dragon dictation software. Despite my best ability to proofread, there is always the potential that unintentional transcriptional errors may still occur from this process.

## 2024-03-17 ENCOUNTER — Telehealth: Payer: Self-pay

## 2024-03-17 NOTE — Telephone Encounter (Signed)
 Spoke with pt. Pt. Advised that he may take potassium Citrate  prior to surgery. Checked with Dorise Pereyra NP with Perioperative services who confirmed this.

## 2024-03-18 ENCOUNTER — Encounter: Payer: Self-pay | Admitting: Urology

## 2024-03-18 ENCOUNTER — Encounter: Payer: Self-pay | Admitting: Urgent Care

## 2024-03-18 ENCOUNTER — Encounter
Admission: RE | Disposition: A | Discharge status: Home / Self Care | Payer: Self-pay | Source: Home / Self Care | Attending: Urology

## 2024-03-18 ENCOUNTER — Other Ambulatory Visit: Payer: Self-pay

## 2024-03-18 ENCOUNTER — Ambulatory Visit: Payer: Self-pay | Admitting: Urgent Care

## 2024-03-18 ENCOUNTER — Ambulatory Visit: Admission: RE | Admit: 2024-03-18 | Discharge: 2024-03-18 | Disposition: A | Attending: Urology | Admitting: Urology

## 2024-03-18 DIAGNOSIS — N138 Other obstructive and reflux uropathy: Secondary | ICD-10-CM | POA: Diagnosis not present

## 2024-03-18 DIAGNOSIS — Z01812 Encounter for preprocedural laboratory examination: Secondary | ICD-10-CM

## 2024-03-18 DIAGNOSIS — I252 Old myocardial infarction: Secondary | ICD-10-CM | POA: Diagnosis not present

## 2024-03-18 DIAGNOSIS — I5022 Chronic systolic (congestive) heart failure: Secondary | ICD-10-CM | POA: Diagnosis not present

## 2024-03-18 DIAGNOSIS — J449 Chronic obstructive pulmonary disease, unspecified: Secondary | ICD-10-CM | POA: Diagnosis not present

## 2024-03-18 DIAGNOSIS — I2510 Atherosclerotic heart disease of native coronary artery without angina pectoris: Secondary | ICD-10-CM | POA: Diagnosis not present

## 2024-03-18 DIAGNOSIS — I447 Left bundle-branch block, unspecified: Secondary | ICD-10-CM

## 2024-03-18 DIAGNOSIS — N401 Enlarged prostate with lower urinary tract symptoms: Secondary | ICD-10-CM | POA: Diagnosis present

## 2024-03-18 DIAGNOSIS — F17200 Nicotine dependence, unspecified, uncomplicated: Secondary | ICD-10-CM | POA: Diagnosis not present

## 2024-03-18 DIAGNOSIS — I110 Hypertensive heart disease with heart failure: Secondary | ICD-10-CM | POA: Diagnosis not present

## 2024-03-18 DIAGNOSIS — K219 Gastro-esophageal reflux disease without esophagitis: Secondary | ICD-10-CM | POA: Diagnosis not present

## 2024-03-18 HISTORY — DX: Cyst of epididymis: N50.3

## 2024-03-18 HISTORY — DX: Long term (current) use of antithrombotics/antiplatelets: Z79.02

## 2024-03-18 HISTORY — DX: Infrarenal abdominal aortic aneurysm, without rupture: I71.43

## 2024-03-18 HISTORY — DX: Neoplasm of unspecified behavior of bladder: D49.4

## 2024-03-18 HISTORY — DX: Unspecified systolic (congestive) heart failure: I50.20

## 2024-03-18 HISTORY — DX: Ventricular premature depolarization: I49.3

## 2024-03-18 HISTORY — PX: HOLEP-LASER ENUCLEATION OF THE PROSTATE WITH MORCELLATION: SHX6641

## 2024-03-18 HISTORY — DX: Long term (current) use of aspirin: Z79.82

## 2024-03-18 HISTORY — DX: Other long term (current) drug therapy: Z79.899

## 2024-03-18 HISTORY — DX: Atherosclerosis of aorta: I70.0

## 2024-03-18 HISTORY — DX: Nontoxic single thyroid nodule: E04.1

## 2024-03-18 LAB — CBC
HCT: 47.3 % (ref 39.0–52.0)
Hemoglobin: 15.3 g/dL (ref 13.0–17.0)
MCH: 30 pg (ref 26.0–34.0)
MCHC: 32.3 g/dL (ref 30.0–36.0)
MCV: 92.7 fL (ref 80.0–100.0)
Platelets: 141 K/uL — ABNORMAL LOW (ref 150–400)
RBC: 5.1 MIL/uL (ref 4.22–5.81)
RDW: 13.1 % (ref 11.5–15.5)
WBC: 7.1 K/uL (ref 4.0–10.5)
nRBC: 0 % (ref 0.0–0.2)

## 2024-03-18 SURGERY — ENUCLEATION, PROSTATE, USING LASER, WITH MORCELLATION
Anesthesia: General | Site: Prostate

## 2024-03-18 MED ORDER — OXYCODONE HCL 5 MG/5ML PO SOLN: 5.0000 mg | Freq: Once | ORAL | Status: AC | PRN

## 2024-03-18 MED ORDER — SUGAMMADEX SODIUM 200 MG/2ML IV SOLN
INTRAVENOUS | Status: DC | PRN
  Administered 2024-03-18: 200 mg via INTRAVENOUS

## 2024-03-18 MED ORDER — ORAL CARE MOUTH RINSE: 15.0000 mL | Freq: Once | OROMUCOSAL | Status: AC

## 2024-03-18 MED ORDER — DEXMEDETOMIDINE HCL IN NACL 200 MCG/50ML IV SOLN
INTRAVENOUS | Status: DC | PRN
  Administered 2024-03-18: 12 ug via INTRAVENOUS
  Administered 2024-03-18: 8 ug via INTRAVENOUS

## 2024-03-18 MED ORDER — STERILE WATER FOR IRRIGATION IR SOLN
Status: DC | PRN
  Administered 2024-03-18: 1000 mL

## 2024-03-18 MED ORDER — CEFAZOLIN SODIUM-DEXTROSE 2-4 GM/100ML-% IV SOLN
2.0000 g | INTRAVENOUS | Status: AC
  Administered 2024-03-18: 2 g via INTRAVENOUS

## 2024-03-18 MED ORDER — ACETAMINOPHEN 10 MG/ML IV SOLN
INTRAVENOUS | Status: DC | PRN
  Administered 2024-03-18: 1000 mg via INTRAVENOUS

## 2024-03-18 MED ORDER — ACETAMINOPHEN 10 MG/ML IV SOLN
INTRAVENOUS | Status: AC
  Filled 2024-03-18: qty 100

## 2024-03-18 MED ORDER — FENTANYL CITRATE (PF) 100 MCG/2ML IJ SOLN
INTRAMUSCULAR | Status: AC
  Filled 2024-03-18: qty 2

## 2024-03-18 MED ORDER — CHLORHEXIDINE GLUCONATE 0.12 % MT SOLN
OROMUCOSAL | Status: AC
  Filled 2024-03-18: qty 15

## 2024-03-18 MED ORDER — PROPOFOL 10 MG/ML IV BOLUS
INTRAVENOUS | Status: DC | PRN
  Administered 2024-03-18: 100 mg via INTRAVENOUS

## 2024-03-18 MED ORDER — LACTATED RINGERS IV SOLN: INTRAVENOUS | Status: DC

## 2024-03-18 MED ORDER — DEXAMETHASONE SOD PHOSPHATE PF 10 MG/ML IJ SOLN
INTRAMUSCULAR | Status: DC | PRN
  Administered 2024-03-18: 10 mg via INTRAVENOUS

## 2024-03-18 MED ORDER — PHENYLEPHRINE 80 MCG/ML (10ML) SYRINGE FOR IV PUSH (FOR BLOOD PRESSURE SUPPORT)
PREFILLED_SYRINGE | INTRAVENOUS | Status: DC | PRN
  Administered 2024-03-18: 80 ug via INTRAVENOUS

## 2024-03-18 MED ORDER — FENTANYL CITRATE (PF) 100 MCG/2ML IJ SOLN
INTRAMUSCULAR | Status: DC | PRN
  Administered 2024-03-18 (×2): 50 ug via INTRAVENOUS

## 2024-03-18 MED ORDER — FENTANYL CITRATE (PF) 100 MCG/2ML IJ SOLN
25.0000 ug | INTRAMUSCULAR | Status: DC | PRN
  Administered 2024-03-18: 25 ug via INTRAVENOUS

## 2024-03-18 MED ORDER — OXYCODONE HCL 5 MG PO TABS
5.0000 mg | ORAL_TABLET | Freq: Once | ORAL | Status: AC | PRN
  Administered 2024-03-18: 5 mg via ORAL

## 2024-03-18 MED ORDER — LIDOCAINE HCL (CARDIAC) PF 100 MG/5ML IV SOSY
PREFILLED_SYRINGE | INTRAVENOUS | Status: DC | PRN
  Administered 2024-03-18: 100 mg via INTRAVENOUS

## 2024-03-18 MED ORDER — EPHEDRINE SULFATE-NACL 50-0.9 MG/10ML-% IV SOSY
PREFILLED_SYRINGE | INTRAVENOUS | Status: DC | PRN
  Administered 2024-03-18 (×2): 5 mg via INTRAVENOUS

## 2024-03-18 MED ORDER — TRAMADOL HCL 50 MG PO TABS: 25.0000 mg | ORAL_TABLET | Freq: Four times a day (QID) | ORAL | 0 refills | Status: AC | PRN

## 2024-03-18 MED ORDER — CHLORHEXIDINE GLUCONATE 0.12 % MT SOLN
15.0000 mL | Freq: Once | OROMUCOSAL | Status: AC
  Administered 2024-03-18: 15 mL via OROMUCOSAL

## 2024-03-18 MED ORDER — OXYCODONE HCL 5 MG PO TABS
ORAL_TABLET | ORAL | Status: AC
  Filled 2024-03-18: qty 1

## 2024-03-18 MED ORDER — ONDANSETRON HCL 4 MG/2ML IJ SOLN: 4.0000 mg | Freq: Once | INTRAMUSCULAR | Status: DC | PRN

## 2024-03-18 MED ORDER — ROCURONIUM BROMIDE 100 MG/10ML IV SOLN
INTRAVENOUS | Status: DC | PRN
  Administered 2024-03-18: 20 mg via INTRAVENOUS
  Administered 2024-03-18: 50 mg via INTRAVENOUS

## 2024-03-18 MED ORDER — MIDAZOLAM HCL 2 MG/2ML IJ SOLN
INTRAMUSCULAR | Status: AC
  Filled 2024-03-18: qty 2

## 2024-03-18 MED ORDER — CEFAZOLIN SODIUM-DEXTROSE 2-4 GM/100ML-% IV SOLN
INTRAVENOUS | Status: AC
  Filled 2024-03-18: qty 100

## 2024-03-18 MED ORDER — ONDANSETRON HCL 4 MG/2ML IJ SOLN
INTRAMUSCULAR | Status: DC | PRN
  Administered 2024-03-18 (×2): 4 mg via INTRAVENOUS

## 2024-03-18 MED ADMIN — Sodium Chloride Irrigation Soln 0.9%: 6000 mL | NDC 00338004724

## 2024-03-18 MED ADMIN — Sodium Chloride Irrigation Soln 0.9%: 15000 mL | NDC 00338004724

## 2024-03-18 SURGICAL SUPPLY — 25 items
ADAPTER IRRIG TUBE 2 SPIKE SOL (ADAPTER) ×2 IMPLANT
BAG URO DRAIN 4000ML (MISCELLANEOUS) ×1 IMPLANT
CATH URETL OPEN END 4X70 (CATHETERS) ×1 IMPLANT
CATH URTH STD 24FR FL 3W 2 (CATHETERS) ×1 IMPLANT
CONTAINER COLLECT MORCELLATR (MISCELLANEOUS) ×1 IMPLANT
DRAPE UTILITY 15X26 TOWEL STRL (DRAPES) IMPLANT
FIBER LASER MOSES 550 DFL (Laser) ×1 IMPLANT
FILTER OVERFLOW MORCELLATOR (FILTER) ×1 IMPLANT
GLOVE BIOGEL PI IND STRL 7.5 (GLOVE) ×2 IMPLANT
GOWN STRL REUS W/ TWL LRG LVL3 (GOWN DISPOSABLE) ×1 IMPLANT
GOWN STRL REUS W/ TWL XL LVL3 (GOWN DISPOSABLE) ×1 IMPLANT
HOLDER FOLEY CATH W/STRAP (MISCELLANEOUS) ×1 IMPLANT
KIT TURNOVER CYSTO (KITS) ×1 IMPLANT
MEMBRANE SLNG YLW 17 FOR INST (MISCELLANEOUS) ×1 IMPLANT
MORCELLATOR ROTATION 4.75 335 (MISCELLANEOUS) ×1 IMPLANT
PACK CYSTO AR (MISCELLANEOUS) ×1 IMPLANT
SET CYSTO IRRIGATION (SET/KITS/TRAYS/PACK) ×1 IMPLANT
SET IRRIG Y-TYPE CYSTO (SET/KITS/TRAYS/PACK) ×1 IMPLANT
SLEEVE PROTECTION STRL DISP (MISCELLANEOUS) ×2 IMPLANT
SOL .9 NS 3000ML IRR UROMATIC (IV SOLUTION) ×6 IMPLANT
SOLN STERILE WATER BTL 1000 ML (IV SOLUTION) ×1 IMPLANT
SURGILUBE 2OZ TUBE FLIPTOP (MISCELLANEOUS) ×1 IMPLANT
SYR 30ML LL (SYRINGE) IMPLANT
SYRINGE TOOMEY IRRIG 70ML (MISCELLANEOUS) ×1 IMPLANT
TUBE PUMP MORCELLATOR PIRANHA (TUBING) ×1 IMPLANT

## 2024-03-18 NOTE — Anesthesia Postprocedure Evaluation (Signed)
 Anesthesia Post Note  Patient: Jeffrey Buchanan  Procedure(s) Performed: ENUCLEATION, PROSTATE, USING LASER, WITH MORCELLATION (Prostate)  Patient location during evaluation: PACU Anesthesia Type: General Level of consciousness: awake and alert Pain management: satisfactory to patient Vital Signs Assessment: post-procedure vital signs reviewed and stable Respiratory status: spontaneous breathing Cardiovascular status: stable Anesthetic complications: no   No notable events documented.   Last Vitals:  Vitals:   03/18/24 1215 03/18/24 1230  BP: (!) 114/55 (!) 113/53  Pulse: (!) 51 (!) 44  Resp: (!) 23 15  Temp:    SpO2: 91% 92%    Last Pain:  Vitals:   03/18/24 1230  TempSrc:   PainSc: Asleep                 VAN STAVEREN,Sagrario Lineberry

## 2024-03-18 NOTE — Op Note (Signed)
 Date of procedure: 03/18/2024  Preoperative diagnosis:  BPH with obstruction  Postoperative diagnosis:  Same  Procedure: HoLEP (Holmium Laser Enucleation of the Prostate)  Surgeon: Redell Burnet, MD  Anesthesia: General  Complications: None  Intraoperative findings:  Large prostate with obstructing lateral lobes, elevated bladder neck, moderate trabeculations, no suspicious lesions Uncomplicated HOLEP, excellent hemostasis, ureteral orifices and verumontanum intact at conclusion of case  EBL: Minimal  Specimens: Prostate chips  Enucleation time: 23 minutes  Morcellation time: 13 minutes  Intra-op weight: 92g  Drains: 24 French three-way, 60 cc in balloon  Indication: Jeffrey Buchanan is a 68 y.o. patient with BPH and obstructive symptoms despite medications who opted for HOLEP.  After reviewing the management options for treatment, they elected to proceed with the above surgical procedure(s). We have discussed the potential benefits and risks of the procedure, side effects of the proposed treatment, the likelihood of the patient achieving the goals of the procedure, and any potential problems that might occur during the procedure or recuperation.  We specifically discussed the risks of bleeding, infection, hematuria and clot retention, need for additional procedures, possible overnight hospital stay, temporary urgency and incontinence, rare long-term incontinence, and retrograde ejaculation.  Informed consent has been obtained.   Description of procedure:  The patient was taken to the operating room and general anesthesia was induced.  The patient was placed in the dorsal lithotomy position, prepped and draped in the usual sterile fashion, and preoperative antibiotics were administered.  SCDs were placed for DVT prophylaxis.  A preoperative time-out was performed.   Fleeta Needs sounds were used to gently dilated the urethra up to 64F. The 68 French continuous flow resectoscope  was inserted into the urethra using the visual obturator  The prostate was large with obstructing lateral lobes and an elevated bladder neck. The bladder was thoroughly inspected and notable for moderate trabeculations but no suspicious lesions.  The ureteral orifices were located in orthotopic position.    The laser was set to 2 J and 60 Hz and early apical release was performed by making a circumferential mucosal incision proximal to the sphincter.  A lambda incision was then made proximal to the verumontanum.  The prostate was enucleated en bloc circumferentially into the bladder.  The capsule was examined and laser was used for meticulous hemostasis.    The 84 French resectoscope was then switched out for the 26 French nephroscope and prostate tissue was morcellated(Piranha) and the tissue sent to pathology.  A 24 French three-way catheter was inserted easily with the aid of a catheter guide, and 60 cc were placed in the balloon.  Urine was clear.  The catheter irrigated easily with a Toomey syringe.  CBI was initiated.   The patient tolerated the procedure well without any immediate complications and was extubated and transferred to the recovery room in stable condition.  Urine was clear on fast CBI.  Disposition: Stable to PACU  Plan: Wean CBI in PACU, anticipate discharge home today with Foley removal in clinic in 2-3 days  Redell Burnet, MD 03/18/2024

## 2024-03-18 NOTE — Transfer of Care (Signed)
 Immediate Anesthesia Transfer of Care Note  Patient: Jeffrey Buchanan  Procedure(s) Performed: ENUCLEATION, PROSTATE, USING LASER, WITH MORCELLATION (Prostate)  Patient Location: PACU  Anesthesia Type:General  Level of Consciousness: awake, drowsy, and patient cooperative  Airway & Oxygen Therapy: Patient Spontanous Breathing and Patient connected to face mask oxygen  Post-op Assessment: Report given to RN and Post -op Vital signs reviewed and stable  Post vital signs: Reviewed and stable  Last Vitals:  Vitals Value Taken Time  BP 130/65 03/18/24 11:50  Temp    Pulse 59 03/18/24 11:53  Resp 25 03/18/24 11:53  SpO2 97 % 03/18/24 11:53  Vitals shown include unfiled device data.  Last Pain:  Vitals:   03/18/24 1019  TempSrc: Temporal  PainSc: 0-No pain         Complications: No notable events documented.

## 2024-03-18 NOTE — Anesthesia Preprocedure Evaluation (Signed)
 Anesthesia Evaluation  Patient identified by MRN, date of birth, ID band Patient awake    Reviewed: Allergy & Precautions, NPO status , Patient's Chart, lab work & pertinent test results  Airway Mallampati: III  TM Distance: >3 FB Neck ROM: full    Dental  (+) Missing, Poor Dentition, Dental Advisory Given   Pulmonary neg pulmonary ROS, COPD,  COPD inhaler, Current Smoker and Patient abstained from smoking.   Pulmonary exam normal  + decreased breath sounds      Cardiovascular Exercise Tolerance: Good hypertension, Pt. on medications + CAD and + Past MI  negative cardio ROS Normal cardiovascular exam+ dysrhythmias  Rhythm:Regular     Neuro/Psych  Headaches  Anxiety     negative neurological ROS  negative psych ROS   GI/Hepatic negative GI ROS, Neg liver ROS,GERD  ,,  Endo/Other  negative endocrine ROS    Renal/GU negative Renal ROS     Musculoskeletal  (+) Arthritis ,    Abdominal   Peds negative pediatric ROS (+)  Hematology negative hematology ROS (+)   Anesthesia Other Findings Past Medical History: 04/06/2003: Acute MI, inferior wall (HCC)     Comment:  a.) PCI 04/10/2003 --> 90% pRCA --> 3.5 x 18 mm and 4.0               x 23 mm Vision stents to RCA. 12/19/2022: Acute pyelonephritis 12/19/2022: AKI (acute kidney injury) No date: Anxiety     Comment:  a.) on BZO (alprazolam ) PRN No date: Aortic atherosclerosis No date: Arthritis No date: Bladder tumor No date: BPH (benign prostatic hyperplasia) No date: Chronic back pain 04/10/2003: Coronary artery disease     Comment:  a.) LHC 04/10/2003 --> EF 40%; 90% pRCA, 40% x 2 mRCA,               30% and 20% pLCx; 3.5 x 18 mm and 4.0 x 23 mm Vision               stents to RCA. No date: DJD (degenerative joint disease) No date: Epididymal cyst (BILATERAL) No date: GERD (gastroesophageal reflux disease) No date: Headache No date: HFrEF (heart failure with  reduced ejection fraction) (HCC) No date: History of adenomatous polyp of colon     Comment:  S/P  PARTIAL COLECTOMY 2006 No date: History of kidney stones No date: HLD (hyperlipidemia) No date: Hyperlipidemia No date: Hypertension No date: Infrarenal abdominal aortic aneurysm (AAA) without rupture No date: LBBB (left bundle branch block) No date: Left thyroid  nodule No date: Long term current use of amiodarone  No date: Long term current use of clopidogrel No date: Long-term use of aspirin  therapy 12/18/2022: MLH1-related Lynch syndrome (HNPCC2) No date: PVC's (premature ventricular contractions) No date: Skin cancer 09/14/2012: Urothelial carcinoma of bladder (HCC)     Comment:  a.) Bx 09/14/2012 --> pathology (+) for low grade               papillary urothelial carcinoma. No date: Vitamin D deficiency  Past Surgical History: 03/04/2017: COLONOSCOPY WITH PROPOFOL ; N/A     Comment:  Procedure: COLONOSCOPY WITH PROPOFOL ;  Surgeon: Tol[place],               Ladell POUR, MD;  Location: [facility];  Service:               Gastroenterology;  Laterality: N/A; 04/10/2003: CORONARY ANGIOPLASTY WITH STENT PLACEMENT; Left     Comment:  Procedure: CORONARY ANGIOPLASTY WITH STENT PLACEMENT               (  3.5 x 18 mm and 4.0 x 23 mm Vision stents to RCA);               Location: [facility]; Surgeon: Wolm Rhyme, MD 09/26/2013: PHYLLIS W/ RETROGRADES; Bilateral     Comment:  Procedure: CYSTOSCOPY WITH BILATERAL RETROGRADE               PYELOGRAM;  Surgeon: Toribio CINDERELLA Repine, MD;  Location:               [facility];  Service: Urology;                Laterality: Bilateral; 06/13/2019: CYSTOSCOPY WITH LITHOLAPAXY; N/A     Comment:  Procedure: CYSTOSCOPY;  Surgeon: Penne Knee, MD;                Location: [facility];  Service: Urology;  Laterality: N/A; 01/28/2021: CYSTOSCOPY WITH LITHOLAPAXY; N/A     Comment:  Procedure: CYSTOSCOPY WITH LITHOLAPAXY;  Surgeon:                Penne Knee, MD;  Location: [facility];  Service:               Urology;  Laterality: N/A; 12/20/2022: CYSTOSCOPY WITH LITHOLAPAXY; N/A     Comment:  Procedure: CYSTOSCOPY WITH LITHOLAPAXY for Bladder               [place];  Surgeon: Twylla Glendia BROCKS, MD;  Location: [facility]               [facility];  Service: Urology;  Laterality: N/A; 12/20/2022: CYSTOSCOPY/URETEROSCOPY/HOLMIUM LASER/STENT PLACEMENT; Left     Comment:  Procedure: CYSTOSCOPY/URETEROSCOPY/HOLMIUM LASER/STENT               PLACEMENT;  Surgeon: Twylla Glendia BROCKS, MD;  Location:               [facility];  Service: Urology;  Laterality: Left; 05/26/2023: CYSTOSCOPY/URETEROSCOPY/HOLMIUM LASER/STENT PLACEMENT; Left     Comment:  Procedure: CYSTOSCOPY/URETEROSCOPY/HOLMIUM LASER/STENT               PLACEMENT;  Surgeon: Twylla Glendia BROCKS, MD;  Location:               [facility];  Service: Urology;  Laterality: Left; 03/04/2017: ESOPHAGOGASTRODUODENOSCOPY (EGD) WITH PROPOFOL ; N/A     Comment:  Procedure: ESOPHAGOGASTRODUODENOSCOPY (EGD) WITH               PROPOFOL ;  Surgeon: Toledo, Ladell POUR, MD;  Location:               [facility];  Service: Gastroenterology;  Laterality:               N/A; 04/07/1974: OPEN REDUCTION SHOULDER DISLOCATION 04/07/2004: PARTIAL COLECTOMY 06/13/2019: STONE EXTRACTION WITH BASKET; N/A     Comment:  Procedure: STONE EXTRACTION WITH BASKET;  Surgeon:               Penne Knee, MD;  Location: [facility];  Service:               Urology;  Laterality: N/A; 2014: TRANSURETHRAL RESECTION OF BLADDER TUMOR; N/A 09/26/2013: TRANSURETHRAL RESECTION OF BLADDER TUMOR WITH GYRUS  (TURBT-GYRUS); N/A     Comment:  Procedure: BLADDER BIOPSY;  Surgeon: Toribio CINDERELLA Repine,               MD;  Location: [facility];  Service:  Urology;  Laterality: N/A;  BMI    Body Mass Index: 26.61 kg/m      Reproductive/Obstetrics negative OB ROS                              Anesthesia  Physical Anesthesia Plan  ASA: 3  Anesthesia Plan: General   Post-op Pain Management:    Induction: Intravenous  PONV Risk Score and Plan: Ondansetron , Dexamethasone , Midazolam  and Treatment may vary due to age or medical condition  Airway Management Planned: Oral ETT  Additional Equipment:   Intra-op Plan:   Post-operative Plan: Extubation in OR  Informed Consent: I have reviewed the patients History and Physical, chart, labs and discussed the procedure including the risks, benefits and alternatives for the proposed anesthesia with the patient or authorized representative who has indicated his/her understanding and acceptance.     Dental Advisory Given  Plan Discussed with: CRNA  Anesthesia Plan Comments:         Anesthesia Quick Evaluation

## 2024-03-18 NOTE — H&P (Addendum)
 03/18/2024 10:30 AM   Jeffrey Buchanan [birth year] [id number]  CC: BPH with obstruction  HPI: 68 year old male with 103 g prostate and obstructive urinary symptoms despite maximal medical management, opted for HOLEP.  Denies any new urinary changes, fever, chills.   PMH: Past Medical History:  Diagnosis Date   Acute MI, inferior wall (HCC) 04/06/2003   a.) PCI 04/10/2003 --> 90% pRCA --> 3.5 x 18 mm and 4.0 x 23 mm Vision stents to RCA.   Acute pyelonephritis 12/19/2022   AKI (acute kidney injury) 12/19/2022   Anxiety    a.) on BZO (alprazolam ) PRN   Aortic atherosclerosis    Arthritis    Bladder tumor    BPH (benign prostatic hyperplasia)    Chronic back pain    Coronary artery disease 04/10/2003   a.) LHC 04/10/2003 --> EF 40%; 90% pRCA, 40% x 2 mRCA, 30% and 20% pLCx; 3.5 x 18 mm and 4.0 x 23 mm Vision stents to RCA.   DJD (degenerative joint disease)    Epididymal cyst (BILATERAL)    GERD (gastroesophageal reflux disease)    Headache    HFrEF (heart failure with reduced ejection fraction) (HCC)    History of adenomatous polyp of colon    S/P  PARTIAL COLECTOMY 2006   History of kidney stones    HLD (hyperlipidemia)    Hyperlipidemia    Hypertension    Infrarenal abdominal aortic aneurysm (AAA) without rupture    LBBB (left bundle branch block)    Left thyroid  nodule    Long term current use of amiodarone     Long term current use of clopidogrel    Long-term use of aspirin  therapy    MLH1-related Lynch syndrome (HNPCC2) 12/18/2022   PVC's (premature ventricular contractions)    Skin cancer    Urothelial carcinoma of bladder (HCC) 09/14/2012   a.) Bx 09/14/2012 --> pathology (+) for low grade papillary urothelial carcinoma.   Vitamin D deficiency     Surgical History: Past Surgical History:  Procedure Laterality Date   COLONOSCOPY WITH PROPOFOL  N/A 03/04/2017   Procedure: COLONOSCOPY WITH PROPOFOL ;  Surgeon: Toledo, Ladell POUR, MD;  Location: [facility];  Service: Gastroenterology;  Laterality: N/A;   CORONARY ANGIOPLASTY WITH STENT PLACEMENT Left 04/10/2003   Procedure: CORONARY ANGIOPLASTY WITH STENT PLACEMENT (3.5 x 18 mm and 4.0 x 23 mm Vision stents to RCA); Location: [facility]; Surgeon: Wolm Rhyme, MD   CYSTOSCOPY W/ RETROGRADES Bilateral 09/26/2013   Procedure: CYSTOSCOPY WITH BILATERAL RETROGRADE PYELOGRAM;  Surgeon: Toribio CINDERELLA Repine, MD;  Location[facility];  Service: Urology;  Laterality: Bilateral;   CYSTOSCOPY WITH LITHOLAPAXY N/A 06/13/2019   Procedure: CYSTOSCOPY;  Surgeon: Penne Knee, MD;  Location: [facility];  Service: Urology;  Laterality: N/A;   CYSTOSCOPY WITH LITHOLAPAXY N/A 01/28/2021   Procedure: CYSTOSCOPY WITH LITHOLAPAXY;  Surgeon: Penne Knee, MD;  Location: [facility];  Service: Urology;  Laterality: N/A;   CYSTOSCOPY WITH LITHOLAPAXY N/A 12/20/2022   Procedure: CYSTOSCOPY WITH LITHOLAPAXY for Bladder [place];  Surgeon: Twylla Glendia BROCKS, MD;  Location: [facility];  Service: Urology;  Laterality: N/A;   CYSTOSCOPY/URETEROSCOPY/HOLMIUM LASER/STENT PLACEMENT Left 12/20/2022   Procedure: CYSTOSCOPY/URETEROSCOPY/HOLMIUM LASER/STENT PLACEMENT;  Surgeon: Twylla Glendia BROCKS, MD;  Location: [facility];  Service: Urology;  Laterality: Left;   CYSTOSCOPY/URETEROSCOPY/HOLMIUM LASER/STENT PLACEMENT Left 05/26/2023   Procedure: CYSTOSCOPY/URETEROSCOPY/HOLMIUM LASER/STENT PLACEMENT;  Surgeon: Twylla Glendia BROCKS, MD;  Location: [facility];  Service: Urology;  Laterality: Left;   ESOPHAGOGASTRODUODENOSCOPY (EGD) WITH PROPOFOL  N/A 03/04/2017  Procedure: ESOPHAGOGASTRODUODENOSCOPY (EGD) WITH PROPOFOL ;  Surgeon: Toledo, Ladell POUR, MD;  Location: [facility];  Service: Gastroenterology;  Laterality: N/A;   OPEN REDUCTION SHOULDER DISLOCATION  04/07/1974   PARTIAL COLECTOMY  04/07/2004   STONE EXTRACTION WITH BASKET N/A 06/13/2019   Procedure: STONE EXTRACTION WITH BASKET;  Surgeon: Penne Knee, MD;  Location:  [facility];  Service: Urology;  Laterality: N/A;   TRANSURETHRAL RESECTION OF BLADDER TUMOR N/A 2014   TRANSURETHRAL RESECTION OF BLADDER TUMOR WITH GYRUS (TURBT-GYRUS) N/A 09/26/2013   Procedure: BLADDER BIOPSY;  Surgeon: Toribio CINDERELLA Repine, MD;  Location: [facility];  Service: Urology;  Laterality: N/A;     Family History: Family History  Problem Relation Age of Onset   Bladder Cancer Mother        dx 96s   Prostate cancer Brother        dx 81s   Cancer Maternal Grandmother        unk type   Kidney disease Neg Hx     Social History:  reports that he has been smoking cigarettes. He has a 32 pack-year smoking history. He has never used smokeless tobacco. He reports that he does not drink alcohol  and does not use drugs.  Physical Exam: BP 122/72   Pulse (!) 57   Temp (!) 97.1 F (36.2 C) (Temporal)   Resp 18   Ht 6' 2 (1.88 m)   Wt 94 kg   SpO2 100%   BMI 26.61 kg/m    Constitutional:  Alert and oriented, No acute distress. Cardiovascular: Regular rate and rhythm Respiratory: Clear to auscultation bilaterally GI: Abdomen is soft, nontender, nondistended, no abdominal masses   Laboratory Data: Urine culture 11/25 no growth   Assessment & Plan:   68 year old male with obstructive urinary symptoms despite medical management, 103 g prostate, who opted for HOLEP for definitive treatment.  Also has fairly significant bilateral nonobstructive stones, uric acid per prior report, consider dissolution therapy after recovered from HOLEP.  We discussed the risks and benefits of HoLEP at length.  The procedure requires general anesthesia and takes 1 to 2 hours, and a holmium laser is used to enucleate the prostate and push this tissue into the bladder.  A morcellator is then used to remove this tissue, which is sent for pathology.  The vast majority(>95%) of patients are able to discharge the same day with a catheter in place for 2 to 3 days, and will follow-up in clinic  for a voiding trial.  We specifically discussed the risks of bleeding, infection, retrograde ejaculation, temporary urgency and urge incontinence, very low risk of long-term incontinence, urethral stricture/bladder neck contracture, pathologic evaluation of prostate tissue and possible detection of prostate cancer or other malignancy, and possible need for additional procedures.   HOLEP today  Redell Burnet, MD 03/18/2024  [facility] [address], [address] [place], [place] [address] [phone]

## 2024-03-18 NOTE — Anesthesia Procedure Notes (Signed)
 Procedure Name: Intubation Date/Time: 03/18/2024 10:44 AM  Performed by: Ledora Duncan, CRNAPre-anesthesia Checklist: Patient identified, Emergency Drugs available, Suction available and Patient being monitored Patient Re-evaluated:Patient Re-evaluated prior to induction Oxygen Delivery Method: Circle system utilized Preoxygenation: Pre-oxygenation with 100% oxygen Induction Type: IV induction Ventilation: Mask ventilation without difficulty Laryngoscope Size: McGrath and 3 Grade View: Grade I Tube type: Oral Tube size: 7.0 mm Number of attempts: 1 Airway Equipment and Method: Stylet Placement Confirmation: ETT inserted through vocal cords under direct vision, positive ETCO2 and breath sounds checked- equal and bilateral Secured at: 21 cm Tube secured with: Tape Dental Injury: Teeth and Oropharynx as per pre-operative assessment  Comments: Neutral, atraumatic TFH CRNA

## 2024-03-19 ENCOUNTER — Encounter: Payer: Self-pay | Admitting: Urology

## 2024-03-21 ENCOUNTER — Ambulatory Visit: Admitting: Physician Assistant

## 2024-03-21 ENCOUNTER — Encounter: Payer: Self-pay | Admitting: Physician Assistant

## 2024-03-21 VITALS — BP 151/81 | HR 54 | Ht 74.0 in | Wt 208.0 lb

## 2024-03-21 DIAGNOSIS — N401 Enlarged prostate with lower urinary tract symptoms: Secondary | ICD-10-CM

## 2024-03-21 DIAGNOSIS — N138 Other obstructive and reflux uropathy: Secondary | ICD-10-CM

## 2024-03-21 LAB — SURGICAL PATHOLOGY

## 2024-03-21 NOTE — Patient Instructions (Signed)

## 2024-03-21 NOTE — Progress Notes (Signed)
 Catheter Removal  Patient is present today for a catheter removal.  58ml of water  was drained from the balloon. A 24FR three-way foley cath was removed from the bladder, no complications were noted. Patient tolerated well.  Performed by: Daegan Arizmendi, PA-C   Additional notes: Counseled patient on normal postoperative findings including dysuria, gross hematuria, and urinary urgency/leakage. Counseled patient to begin Kegel exercises 3x10 sets daily to increase urinary control and wear absorbent products as needed for security. Written and verbal resources provided today. Surgical pathology benign, results shared with patient.  Follow up: Return in about 4 months (around 07/20/2024) for Postop f/u with Dr. Francisca.

## 2024-03-22 ENCOUNTER — Ambulatory Visit: Payer: Self-pay | Admitting: Urology

## 2024-05-03 ENCOUNTER — Ambulatory Visit: Admitting: Physician Assistant

## 2024-05-04 ENCOUNTER — Ambulatory Visit: Admitting: Physician Assistant

## 2024-05-04 VITALS — BP 134/75 | HR 74 | Ht 74.0 in | Wt 206.0 lb

## 2024-05-04 DIAGNOSIS — R3989 Other symptoms and signs involving the genitourinary system: Secondary | ICD-10-CM

## 2024-05-04 DIAGNOSIS — R350 Frequency of micturition: Secondary | ICD-10-CM | POA: Diagnosis not present

## 2024-05-04 LAB — BLADDER SCAN AMB NON-IMAGING: Scan Result: 0

## 2024-05-04 MED ORDER — OXYBUTYNIN CHLORIDE ER 10 MG PO TB24: 10.0000 mg | ORAL_TABLET | Freq: Every day | ORAL | 0 refills | Status: AC

## 2024-05-04 NOTE — Progress Notes (Signed)
 "  05/04/2024 4:07 PM   Jeffrey Buchanan [birth year] [id number]  CC: Chief Complaint  Patient presents with   Urinary Tract Infection   HPI: Jeffrey Buchanan is a 69 y.o. male with PMH heart failure on Jardiance and BPH who underwent HoLEP with Dr. Francisca on 03/18/2024 who presents today for evaluation of possible UTI.   Today he reports stable urinary frequency every 15 to 30 minutes since his catheter was removed postop.  He denies dysuria.  His leakage has progressively improved since surgery.  He has nocturia every 60 to 90 minutes overnight.  He denies fever, chills, nausea, or vomiting.  In-office UA today positive for 3+ glucose, 1+ protein, 3+ blood, and trace leukocytes; urine microscopy with >30 WBCs/HPF and 11-30 RBCs/HPF. PVR 0mL.  PMH: Past Medical History:  Diagnosis Date   Acute MI, inferior wall (HCC) 04/06/2003   a.) PCI 04/10/2003 --> 90% pRCA --> 3.5 x 18 mm and 4.0 x 23 mm Vision stents to RCA.   Acute pyelonephritis 12/19/2022   AKI (acute kidney injury) 12/19/2022   Anxiety    a.) on BZO (alprazolam ) PRN   Aortic atherosclerosis    Arthritis    Bladder tumor    BPH (benign prostatic hyperplasia)    Chronic back pain    Coronary artery disease 04/10/2003   a.) LHC 04/10/2003 --> EF 40%; 90% pRCA, 40% x 2 mRCA, 30% and 20% pLCx; 3.5 x 18 mm and 4.0 x 23 mm Vision stents to RCA.   DJD (degenerative joint disease)    Epididymal cyst (BILATERAL)    GERD (gastroesophageal reflux disease)    Headache    HFrEF (heart failure with reduced ejection fraction) (HCC)    History of adenomatous polyp of colon    S/P  PARTIAL COLECTOMY 2006   History of kidney stones    HLD (hyperlipidemia)    Hyperlipidemia    Hypertension    Infrarenal abdominal aortic aneurysm (AAA) without rupture    LBBB (left bundle branch block)    Left thyroid  nodule    Long term current use of amiodarone     Long term current use of clopidogrel    Long-term use of aspirin  therapy     MLH1-related Lynch syndrome (HNPCC2) 12/18/2022   PVC's (premature ventricular contractions)    Skin cancer    Urothelial carcinoma of bladder (HCC) 09/14/2012   a.) Bx 09/14/2012 --> pathology (+) for low grade papillary urothelial carcinoma.   Vitamin D deficiency     Surgical History: Past Surgical History:  Procedure Laterality Date   COLONOSCOPY WITH PROPOFOL  N/A 03/04/2017   Procedure: COLONOSCOPY WITH PROPOFOL ;  Surgeon: Toledo, Ladell POUR, MD;  Location: [facility];  Service: Gastroenterology;  Laterality: N/A;   CORONARY ANGIOPLASTY WITH STENT PLACEMENT Left 04/10/2003   Procedure: CORONARY ANGIOPLASTY WITH STENT PLACEMENT (3.5 x 18 mm and 4.0 x 23 mm Vision stents to RCA); Location: [facility]; Surgeon: Wolm Rhyme, MD   CYSTOSCOPY W/ RETROGRADES Bilateral 09/26/2013   Procedure: CYSTOSCOPY WITH BILATERAL RETROGRADE PYELOGRAM;  Surgeon: Toribio CINDERELLA Repine, MD;  Location: [facility];  Service: Urology;  Laterality: Bilateral;   CYSTOSCOPY WITH LITHOLAPAXY N/A 06/13/2019   Procedure: CYSTOSCOPY;  Surgeon: Penne Knee, MD;  Location: [facility];  Service: Urology;  Laterality: N/A;   CYSTOSCOPY WITH LITHOLAPAXY N/A 01/28/2021   Procedure: CYSTOSCOPY WITH LITHOLAPAXY;  Surgeon: Penne Knee, MD;  Location: [facility];  Service: Urology;  Laterality: N/A;   CYSTOSCOPY WITH LITHOLAPAXY N/A 12/20/2022  Procedure: CYSTOSCOPY WITH LITHOLAPAXY for Bladder [place];  Surgeon: Twylla Glendia BROCKS, MD;  Location: [facility];  Service: Urology;  Laterality: N/A;   CYSTOSCOPY/URETEROSCOPY/HOLMIUM LASER/STENT PLACEMENT Left 12/20/2022   Procedure: CYSTOSCOPY/URETEROSCOPY/HOLMIUM LASER/STENT PLACEMENT;  Surgeon: Twylla Glendia BROCKS, MD;  Location: [facility];  Service: Urology;  Laterality: Left;   CYSTOSCOPY/URETEROSCOPY/HOLMIUM LASER/STENT PLACEMENT Left 05/26/2023   Procedure: CYSTOSCOPY/URETEROSCOPY/HOLMIUM LASER/STENT PLACEMENT;  Surgeon: Twylla Glendia BROCKS, MD;  Location: [facility];   Service: Urology;  Laterality: Left;   ESOPHAGOGASTRODUODENOSCOPY (EGD) WITH PROPOFOL  N/A 03/04/2017   Procedure: ESOPHAGOGASTRODUODENOSCOPY (EGD) WITH PROPOFOL ;  Surgeon: Toledo, Ladell POUR, MD;  Location: [facility];  Service: Gastroenterology;  Laterality: N/A;   HOLEP-LASER ENUCLEATION OF THE PROSTATE WITH MORCELLATION N/A 03/18/2024   Procedure: ENUCLEATION, PROSTATE, USING LASER, WITH MORCELLATION;  Surgeon: Francisca Redell BROCKS, MD;  Location: [facility];  Service: Urology;  Laterality: N/A;   OPEN REDUCTION SHOULDER DISLOCATION  04/07/1974   PARTIAL COLECTOMY  04/07/2004   STONE EXTRACTION WITH BASKET N/A 06/13/2019   Procedure: STONE EXTRACTION WITH BASKET;  Surgeon: Penne Knee, MD;  Location: [facility];  Service: Urology;  Laterality: N/A;   TRANSURETHRAL RESECTION OF BLADDER TUMOR N/A 2014   TRANSURETHRAL RESECTION OF BLADDER TUMOR WITH GYRUS (TURBT-GYRUS) N/A 09/26/2013   Procedure: BLADDER BIOPSY;  Surgeon: Toribio CINDERELLA Repine, MD;  Location: [facility];  Service: Urology;  Laterality: N/A;    Home Medications:  Allergies as of 05/04/2024       Reactions   Cephalosporins Diarrhea   Escitalopram Oxalate Other (See Comments)   lethargic        Medication List        Accurate as of May 04, 2024  4:08 PM. If you have any questions, ask your nurse or doctor.          ALPRAZolam  0.5 MG tablet Commonly known as: XANAX  Take 0.25 mg by mouth 2 (two) times daily.   amiodarone  200 MG tablet Commonly known as: PACERONE  Take 200 mg by mouth at bedtime.   aspirin  EC 81 MG tablet Take 81 mg by mouth at bedtime.   clopidogrel 75 MG tablet Commonly known as: PLAVIX Take 75 mg by mouth at bedtime.   docusate sodium  100 MG capsule Commonly known as: COLACE Take 100 mg by mouth at bedtime.   empagliflozin 10 MG Tabs tablet Commonly known as: JARDIANCE Take 10 mg by mouth daily.   HYDROcodone -acetaminophen  5-325 MG tablet Commonly known as:  NORCO/VICODIN Take 1 tablet by mouth every 6 (six) hours as needed for moderate pain.   metoprolol  succinate 50 MG 24 hr tablet Commonly known as: TOPROL -XL Take 50 mg by mouth every evening. Take with or immediately following a meal.   mupirocin  ointment 2 % Commonly known as: BACTROBAN  Apply to open sores twice daily until healed. What changed:  how much to take how to take this when to take this reasons to take this additional instructions   ofloxacin 0.3 % OTIC solution Commonly known as: FLOXIN Place 5 drops into the right ear 4 (four) times daily.   Potassium Citrate  15 MEQ (1620 MG) Tbcr Take 1 tablet by mouth 2 (two) times daily with a meal. TAKE 1 TABLET BY MOUTH IN THE MORNING AND IN THE EVENING What changed:  when to take this additional instructions   prednisoLONE acetate 1 % ophthalmic suspension Commonly known as: PRED FORTE Place 1 drop into the right eye 4 (four) times daily.   rosuvastatin  40 MG tablet Commonly known as:  CRESTOR  Take 40 mg by mouth at bedtime.   sacubitril-valsartan 49-51 MG Commonly known as: ENTRESTO Take 1 tablet by mouth every 12 (twelve) hours.   tamsulosin  0.4 MG Caps capsule Commonly known as: FLOMAX  0.4 mg.   TRIAMCINOLONE  ACETONIDE IN Place 1 spray into both nostrils daily as needed. Nasal spray   triamcinolone  cream 0.1 % Commonly known as: KENALOG  Apply to itchy rash once to twice daily until improved. Avoid applying to face, groin, and axilla. Use as directed. Long-term use can cause thinning of the skin. What changed:  how much to take how to take this when to take this additional instructions   Vitamin D3 50 MCG (2000 UT) Tabs Take 2,000 Units by mouth daily.        Allergies:  Allergies[1]  Family History: Family History  Problem Relation Age of Onset   Bladder Cancer Mother        dx 22s   Prostate cancer Brother        dx 50s   Cancer Maternal Grandmother        unk type   Kidney disease Neg  Hx     Social History:   reports that he has been smoking cigarettes. He has a 32 pack-year smoking history. He has never used smokeless tobacco. He reports that he does not drink alcohol  and does not use drugs.  Physical Exam: BP 134/75 (Patient Position: Sitting, Cuff Size: Normal)   Pulse 74   Ht 6' 2 (1.88 m)   Wt 206 lb (93.4 kg)   SpO2 95%   BMI 26.45 kg/m   Constitutional:  Alert and oriented, no acute distress, nontoxic appearing HEENT: [place], AT Cardiovascular: No clubbing, cyanosis, or edema Respiratory: Normal respiratory effort, no increased work of breathing Skin: No rashes, bruises or suspicious lesions Neurologic: Grossly intact, no focal deficits, moving all 4 extremities Psychiatric: Normal mood and affect  Laboratory Data: Results for orders placed or performed in visit on 05/04/24  Bladder Scan (Post Void Residual) in office   Collection Time: 05/04/24  4:06 PM  Result Value Ref Range   Scan Result 0    Assessment & Plan:   1. Urinary frequency (Primary) Postop urinary frequency and nocturia, stable.  No dysuria.  He is emptying appropriately and UA is consistent with UTI versus postop status.  I offered him empiric antibiotics while we await culture results, however he prefers to defer these because he gets diarrhea with many antibiotics.  Will start oxybutynin  for symptom control and contact him with culture results, treating as indicated. - Urinalysis, Complete - Bladder Scan (Post Void Residual) in office - CULTURE, URINE COMPREHENSIVE - oxybutynin  (DITROPAN -XL) 10 MG 24 hr tablet; Take 1 tablet (10 mg total) by mouth daily.  Dispense: 30 tablet; Refill: 0   Return for Will call with results.  Lucie Hones, PA-C  [facility] [place] [address] [place], [place] [address] [phone]      [1]  Allergies Allergen Reactions   Cephalosporins Diarrhea   Escitalopram Oxalate Other (See Comments)    lethargic    "

## 2024-05-04 NOTE — Patient Instructions (Signed)
 Will call with results.

## 2024-05-05 LAB — URINALYSIS, COMPLETE
Bilirubin, UA: NEGATIVE
Ketones, UA: NEGATIVE
Nitrite, UA: NEGATIVE
Specific Gravity, UA: 1.02 (ref 1.005–1.030)
Urobilinogen, Ur: 0.2 mg/dL (ref 0.2–1.0)
pH, UA: 5 (ref 5.0–7.5)

## 2024-05-05 LAB — MICROSCOPIC EXAMINATION: WBC, UA: >30 /HPF — AB (ref 0–5)

## 2024-05-07 LAB — CULTURE, URINE COMPREHENSIVE

## 2024-05-10 ENCOUNTER — Ambulatory Visit: Payer: Self-pay | Admitting: Physician Assistant

## 2024-05-12 ENCOUNTER — Encounter: Payer: Self-pay | Admitting: Urology

## 2024-07-06 ENCOUNTER — Ambulatory Visit: Admitting: Urology

## 2024-08-22 ENCOUNTER — Ambulatory Visit: Admitting: Dermatology
